# Patient Record
Sex: Female | Born: 1950
Health system: Southern US, Community
[De-identification: ages and names within clinical notes are randomized; demographics above are authoritative.]

## PROBLEM LIST (undated history)

## (undated) DIAGNOSIS — Z87891 Personal history of nicotine dependence: Secondary | ICD-10-CM

## (undated) DIAGNOSIS — E119 Type 2 diabetes mellitus without complications: Secondary | ICD-10-CM

## (undated) DIAGNOSIS — T8859XA Other complications of anesthesia, initial encounter: Secondary | ICD-10-CM

## (undated) DIAGNOSIS — N289 Disorder of kidney and ureter, unspecified: Secondary | ICD-10-CM

## (undated) DIAGNOSIS — I671 Cerebral aneurysm, nonruptured: Secondary | ICD-10-CM

## (undated) DIAGNOSIS — E785 Hyperlipidemia, unspecified: Secondary | ICD-10-CM

## (undated) DIAGNOSIS — I5022 Chronic systolic (congestive) heart failure: Secondary | ICD-10-CM

## (undated) DIAGNOSIS — I779 Disorder of arteries and arterioles, unspecified: Secondary | ICD-10-CM

## (undated) DIAGNOSIS — I10 Essential (primary) hypertension: Secondary | ICD-10-CM

## (undated) DIAGNOSIS — I639 Cerebral infarction, unspecified: Secondary | ICD-10-CM

## (undated) DIAGNOSIS — T4145XA Adverse effect of unspecified anesthetic, initial encounter: Secondary | ICD-10-CM

## (undated) DIAGNOSIS — I700 Atherosclerosis of aorta: Secondary | ICD-10-CM

## (undated) DIAGNOSIS — I428 Other cardiomyopathies: Secondary | ICD-10-CM

## (undated) DIAGNOSIS — I2510 Atherosclerotic heart disease of native coronary artery without angina pectoris: Secondary | ICD-10-CM

## (undated) HISTORY — DX: Other cardiomyopathies: I42.8

## (undated) HISTORY — DX: Atherosclerosis of aorta: I70.0

## (undated) HISTORY — DX: Disorder of kidney and ureter, unspecified: N28.9

## (undated) HISTORY — DX: Cerebral infarction, unspecified: I63.9

## (undated) HISTORY — DX: Essential (primary) hypertension: I10

## (undated) HISTORY — DX: Disorder of arteries and arterioles, unspecified: I77.9

## (undated) HISTORY — DX: Cerebral aneurysm, nonruptured: I67.1

## (undated) HISTORY — DX: Atherosclerotic heart disease of native coronary artery without angina pectoris: I25.10

## (undated) HISTORY — DX: Chronic systolic (congestive) heart failure: I50.22

---

## 2017-06-17 ENCOUNTER — Encounter (HOSPITAL_BASED_OUTPATIENT_CLINIC_OR_DEPARTMENT_OTHER): Payer: Self-pay | Admitting: *Deleted

## 2017-06-17 ENCOUNTER — Emergency Department (HOSPITAL_BASED_OUTPATIENT_CLINIC_OR_DEPARTMENT_OTHER): Payer: Medicare Other

## 2017-06-17 ENCOUNTER — Inpatient Hospital Stay (HOSPITAL_BASED_OUTPATIENT_CLINIC_OR_DEPARTMENT_OTHER)
Admission: EM | Admit: 2017-06-17 | Discharge: 2017-06-21 | DRG: 041 | Disposition: A | Discharge status: Home Health | Payer: Medicare Other | Attending: Internal Medicine | Admitting: Internal Medicine

## 2017-06-17 DIAGNOSIS — E119 Type 2 diabetes mellitus without complications: Secondary | ICD-10-CM

## 2017-06-17 DIAGNOSIS — R471 Dysarthria and anarthria: Secondary | ICD-10-CM | POA: Diagnosis present

## 2017-06-17 DIAGNOSIS — R260 Ataxic gait: Secondary | ICD-10-CM | POA: Diagnosis present

## 2017-06-17 DIAGNOSIS — I110 Hypertensive heart disease with heart failure: Secondary | ICD-10-CM | POA: Diagnosis present

## 2017-06-17 DIAGNOSIS — E785 Hyperlipidemia, unspecified: Secondary | ICD-10-CM | POA: Diagnosis present

## 2017-06-17 DIAGNOSIS — I6521 Occlusion and stenosis of right carotid artery: Secondary | ICD-10-CM | POA: Diagnosis present

## 2017-06-17 DIAGNOSIS — R410 Disorientation, unspecified: Secondary | ICD-10-CM

## 2017-06-17 DIAGNOSIS — I420 Dilated cardiomyopathy: Secondary | ICD-10-CM | POA: Diagnosis present

## 2017-06-17 DIAGNOSIS — Z79899 Other long term (current) drug therapy: Secondary | ICD-10-CM

## 2017-06-17 DIAGNOSIS — E876 Hypokalemia: Secondary | ICD-10-CM | POA: Diagnosis present

## 2017-06-17 DIAGNOSIS — I5020 Unspecified systolic (congestive) heart failure: Secondary | ICD-10-CM | POA: Diagnosis present

## 2017-06-17 DIAGNOSIS — R4182 Altered mental status, unspecified: Secondary | ICD-10-CM | POA: Diagnosis not present

## 2017-06-17 DIAGNOSIS — R29707 NIHSS score 7: Secondary | ICD-10-CM | POA: Diagnosis present

## 2017-06-17 DIAGNOSIS — R4701 Aphasia: Secondary | ICD-10-CM

## 2017-06-17 DIAGNOSIS — I63412 Cerebral infarction due to embolism of left middle cerebral artery: Secondary | ICD-10-CM | POA: Diagnosis not present

## 2017-06-17 DIAGNOSIS — Z7984 Long term (current) use of oral hypoglycemic drugs: Secondary | ICD-10-CM

## 2017-06-17 DIAGNOSIS — Z8673 Personal history of transient ischemic attack (TIA), and cerebral infarction without residual deficits: Secondary | ICD-10-CM | POA: Diagnosis present

## 2017-06-17 DIAGNOSIS — I459 Conduction disorder, unspecified: Secondary | ICD-10-CM | POA: Diagnosis present

## 2017-06-17 DIAGNOSIS — Z87891 Personal history of nicotine dependence: Secondary | ICD-10-CM

## 2017-06-17 DIAGNOSIS — I671 Cerebral aneurysm, nonruptured: Secondary | ICD-10-CM | POA: Diagnosis present

## 2017-06-17 HISTORY — DX: Personal history of nicotine dependence: Z87.891

## 2017-06-17 HISTORY — DX: Adverse effect of unspecified anesthetic, initial encounter: T41.45XA

## 2017-06-17 HISTORY — DX: Hyperlipidemia, unspecified: E78.5

## 2017-06-17 HISTORY — DX: Other complications of anesthesia, initial encounter: T88.59XA

## 2017-06-17 HISTORY — DX: Type 2 diabetes mellitus without complications: E11.9

## 2017-06-17 LAB — TROPONIN I

## 2017-06-17 LAB — BASIC METABOLIC PANEL
Anion gap: 11 (ref 5–15)
BUN: 13 mg/dL (ref 6–20)
CO2: 25 mmol/L (ref 22–32)
Calcium: 9.4 mg/dL (ref 8.9–10.3)
Chloride: 103 mmol/L (ref 101–111)
Creatinine, Ser: 0.62 mg/dL (ref 0.44–1.00)
Glucose, Bld: 136 mg/dL — ABNORMAL HIGH (ref 65–99)
POTASSIUM: 3.2 mmol/L — AB (ref 3.5–5.1)
SODIUM: 139 mmol/L (ref 135–145)

## 2017-06-17 LAB — CBC WITH DIFFERENTIAL/PLATELET
BASOS ABS: 0.1 10*3/uL (ref 0.0–0.1)
BASOS PCT: 1 %
EOS ABS: 0.1 10*3/uL (ref 0.0–0.7)
EOS PCT: 1 %
HCT: 34.4 % — ABNORMAL LOW (ref 36.0–46.0)
Hemoglobin: 11.6 g/dL — ABNORMAL LOW (ref 12.0–15.0)
LYMPHS ABS: 2.5 10*3/uL (ref 0.7–4.0)
Lymphocytes Relative: 41 %
MCH: 33 pg (ref 26.0–34.0)
MCHC: 33.7 g/dL (ref 30.0–36.0)
MCV: 97.7 fL (ref 78.0–100.0)
Monocytes Absolute: 0.5 10*3/uL (ref 0.1–1.0)
Monocytes Relative: 8 %
Neutro Abs: 3 10*3/uL (ref 1.7–7.7)
Neutrophils Relative %: 49 %
PLATELETS: 253 10*3/uL (ref 150–400)
RBC: 3.52 MIL/uL — AB (ref 3.87–5.11)
RDW: 11.8 % (ref 11.5–15.5)
WBC: 6.1 10*3/uL (ref 4.0–10.5)

## 2017-06-17 LAB — RAPID URINE DRUG SCREEN, HOSP PERFORMED
AMPHETAMINES: NOT DETECTED
BENZODIAZEPINES: NOT DETECTED
Barbiturates: NOT DETECTED
Cocaine: NOT DETECTED
OPIATES: NOT DETECTED
TETRAHYDROCANNABINOL: NOT DETECTED

## 2017-06-17 LAB — PROTIME-INR
INR: 1.06
Prothrombin Time: 13.8 seconds (ref 11.4–15.2)

## 2017-06-17 LAB — URINALYSIS, ROUTINE W REFLEX MICROSCOPIC
BILIRUBIN URINE: NEGATIVE
Glucose, UA: NEGATIVE mg/dL
Hgb urine dipstick: NEGATIVE
KETONES UR: NEGATIVE mg/dL
LEUKOCYTES UA: NEGATIVE
NITRITE: NEGATIVE
PROTEIN: NEGATIVE mg/dL
Specific Gravity, Urine: 1.023 (ref 1.005–1.030)
pH: 6.5 (ref 5.0–8.0)

## 2017-06-17 LAB — APTT: APTT: 31 s (ref 24–36)

## 2017-06-17 LAB — ETHANOL

## 2017-06-17 LAB — CBG MONITORING, ED: GLUCOSE-CAPILLARY: 131 mg/dL — AB (ref 65–99)

## 2017-06-17 MED ORDER — IOPAMIDOL (ISOVUE-370) INJECTION 76%
100.0000 mL | Freq: Once | INTRAVENOUS | Status: AC | PRN
  Administered 2017-06-17: 100 mL via INTRAVENOUS

## 2017-06-17 NOTE — ED Provider Notes (Signed)
[facility] Provider Note   CSN: [id number] Arrival date & time: 06/17/17  2125     History   Chief Complaint Chief Complaint  Patient presents with  . Altered Mental Status    HPI Elizabeth Bowman is a 66 y.o. female.  HPI  Patient, with a past medical history of diabetes, hypertension presents to ED for evaluation of altered mental status, confusion and aphasia. Husband states that last known normal was approximately 12 hours ago. He states that she was fixing breakfast and after she went back to bed she began having slurring of her words. He states that she initially did not want to come that the doctor. He states that she is unable to follow commands and unable to tell if symptoms. He denies any previous stroke, MI, DVT or PE. He reports compliance with her home medications. He denies any cough or other complaints prior to this episode. He denies any falls or head injuries.  Past Medical History:  Diagnosis Date  . Diabetes mellitus without complication (HCC)     There are no active problems to display for this patient.   History reviewed. No pertinent surgical history.  OB History    No data available       Home Medications    Prior to Admission medications   Medication Sig Start Date End Date Taking? Authorizing Provider  atorvastatin (LIPITOR) 40 MG tablet Take 40 mg by mouth daily.   Yes [provider]  lisinopril-hydrochlorothiazide (PRINZIDE,ZESTORETIC) 20-12.5 MG tablet Take 1 tablet by mouth daily.   Yes [provider]  metFORMIN (GLUCOPHAGE) 500 MG tablet Take by mouth 2 (two) times daily with a meal.   Yes [provider]  pantoprazole (PROTONIX) 40 MG tablet Take 40 mg by mouth daily.   Yes [provider]    Family History History reviewed. No pertinent family history.  Social History Social History  Substance Use Topics  . Smoking status: Never Smoker  . Smokeless tobacco: Not on file  . Alcohol use  No     Allergies   Patient has no known allergies.   Review of Systems Review of Systems  Unable to perform ROS: Mental status change  Respiratory: Negative for cough and shortness of breath.   Gastrointestinal: Negative for vomiting.  Neurological: Positive for speech difficulty and weakness. Negative for dizziness and light-headedness.     Physical Exam Updated Vital Signs BP (!) 159/74   Pulse 66   Temp 97.6 F (36.4 C)   Resp 20   Ht 5\' 7"  (1.702 m)   Wt 72.6 kg (160 lb)   SpO2 98%   BMI 25.06 kg/m   Physical Exam  Constitutional: She appears well-developed and well-nourished. No distress.  HENT:  Head: Normocephalic and atraumatic.  Nose: Nose normal.  Eyes: Conjunctivae and EOM are normal. Right eye exhibits no discharge. Left eye exhibits no discharge. No scleral icterus.  Neck: Normal range of motion. Neck supple.  Cardiovascular: Normal rate, regular rhythm, normal heart sounds and intact distal pulses.  Exam reveals no gallop and no friction rub.   No murmur heard. Pulmonary/Chest: Effort normal and breath sounds normal. No respiratory distress.  Abdominal: Soft. Bowel sounds are normal. She exhibits no distension. There is no tenderness. There is no guarding.  Musculoskeletal: Normal range of motion. She exhibits no edema.  Neurological: She is alert.  Oriented to person. Unable to follow commands. Unable to assess cranial nerves, coordination, sensation.  Skin: Skin is warm  and dry. No rash noted.  Psychiatric: She has a normal mood and affect.  Nursing note and vitals reviewed.    ED Treatments / Results  Labs (all labs ordered are listed, but only abnormal results are displayed) Labs Reviewed  CBC WITH DIFFERENTIAL/PLATELET - Abnormal; Notable for the following:       Result Value   RBC 3.52 (*)    Hemoglobin 11.6 (*)    HCT 34.4 (*)    All other components within normal limits  BASIC METABOLIC PANEL - Abnormal; Notable for the following:     Potassium 3.2 (*)    Glucose, Bld 136 (*)    All other components within normal limits  CBG MONITORING, ED - Abnormal; Notable for the following:    Glucose-Capillary 131 (*)    All other components within normal limits  URINALYSIS, ROUTINE W REFLEX MICROSCOPIC  TROPONIN I  ETHANOL  PROTIME-INR  APTT  RAPID URINE DRUG SCREEN, HOSP PERFORMED  CBG MONITORING, ED  I-STAT CHEM 8, ED    EKG  EKG Interpretation  Date/Time:  Saturday June 17 2017 21:33:45 EDT Ventricular Rate:  74 PR Interval:  180 QRS Duration: 96 QT Interval:  384 QTC Calculation: 426 R Axis:   48 Text Interpretation:  Normal sinus rhythm Minimal voltage criteria for LVH, may be normal variant Borderline ECG No STEMI.  Confirmed by Alona Bene [phone]) on 06/17/2017 10:25:34 PM       Radiology Ct Angio Head W Or Wo Contrast  Result Date: 06/17/2017 CLINICAL DATA:  Confusion and aphasia, last seen normal at 10 a.m. LEFT-sided headache. History of diabetes. EXAM: CT ANGIOGRAPHY HEAD AND NECK TECHNIQUE: Multidetector CT imaging of the head and neck was performed using the standard protocol during bolus administration of intravenous contrast. Multiplanar CT image reconstructions and MIPs were obtained to evaluate the vascular anatomy. Carotid stenosis measurements (when applicable) are obtained utilizing NASCET criteria, using the distal internal carotid diameter as the denominator. CONTRAST:  100 cc Isovue 370 COMPARISON:  None. FINDINGS: CT HEAD FINDINGS BRAIN: No intraparenchymal hemorrhage, mass effect nor midline shift. The ventricles and sulci are normal for age. Patchy supratentorial white matter hypodensities less than expected for patient's age, though non-specific are most compatible with chronic small vessel ischemic disease. Faint small hypodensity LEFT temporal lobe (axial 18/34). No abnormal extra-axial fluid collections. Basal cisterns are patent. VASCULAR: Moderate calcific atherosclerosis of the carotid  siphons. SKULL: No skull fracture. Large tooth 13 periapical abscess. No significant scalp soft tissue swelling. SINUSES/ORBITS: Mild lobulated RIGHT maxillary sinus mucosal thickening without air-fluid levels. Mastoid air cells are well aerated. The included ocular globes and orbital contents are non-suspicious. OTHER: None. CTA NECK AORTIC ARCH: Normal appearance of the thoracic arch, normal branch pattern. Mild calcific atherosclerosis. The origins of the innominate, left Common carotid artery and subclavian artery are widely patent. RIGHT CAROTID SYSTEM: Common carotid artery is widely patent, mild calcific atherosclerosis. Normal appearance of the carotid bifurcation without hemodynamically significant stenosis by NASCET criteria. Moderate atherosclerosis proximal LEFT internal carotid artery without stenosis. LEFT CAROTID SYSTEM: Common carotid artery is widely patent, mild calcific atherosclerosis. Patent carotid carotid bifurcation without hemodynamically significant stenosis by NASCET criteria, moderate eccentric calcific atherosclerosis. Normal appearance of the included internal carotid artery. VERTEBRAL ARTERIES:Codominant vertebral artery's. Mild calcific atherosclerosis proximal bilateral vertebral artery's which are patent throughout the course. Mild extrinsic compression due to degenerative cervical spine. SKELETON: No acute osseous process though bone windows have not been submitted. Severe C5-6 degenerative disc, moderate  at C3-4. Moderate to severe RIGHT C3-4 and severe LEFT greater than RIGHT C5-6 neural foraminal narrowing. Moderate canal stenosis C5-6. OTHER NECK: Soft tissues of the neck are non-acute though, not tailored for evaluation. CTA HEAD ANTERIOR CIRCULATION: Patent cervical internal carotid arteries, petrous, cavernous and supra clinoid internal carotid arteries. Moderate stenosis RIGHT cavernous internal carotid artery. Wide necked 4 mm posteriorly directed aneurysm LEFT anterior  genu (series 10, axial 245/349). Wide necked 4 mm laterally directed RIGHT paraophthalmic aneurysm. Widely patent anterior communicating artery. Patent anterior and middle cerebral arteries. Severe stenosis LEFT M2 segment, superior division. No large vessel occlusion, contrast extravasation. POSTERIOR CIRCULATION: Patent vertebral arteries, vertebrobasilar junction and basilar artery, as well as main branch vessels. Patent posterior cerebral arteries, mild luminal regularity. No large vessel occlusion, significant stenosis, contrast extravasation or aneurysm. VENOUS SINUSES: Major dural venous sinuses are patent though not tailored for evaluation on this angiographic examination. ANATOMIC VARIANTS: Aplastic RIGHT A1 segment, bilateral anterior cerebral arteries arise from LEFT A1-2 junction. DELAYED PHASE: No abnormal intracranial enhancement. MIP images reviewed. IMPRESSION: CT HEAD: 1. Small LEFT temporal lobe acute infarct versus beam hardening artifact. 2. Otherwise negative noncontrast CT HEAD for age. CTA NECK: 1. Atherosclerosis without hemodynamically significant stenosis or acute vascular process. 2. Degenerative cervical spine resulting in moderate canal stenosis C5-6. Severe C3-4 and C5-6 neural foraminal narrowing. CTA HEAD: 1. Severe stenosis LEFT M2 segment without emergent large vessel occlusion. 2. 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 3. Atherosclerosis with moderate stenosis RIGHT cavernous internal carotid artery. 4. Neuro-Interventional Radiology consultation is suggested to evaluate the appropriateness of potential treatment. Non-emergent evaluation can be arranged by calling [phone] during usual hours. Emergency evaluation can be requested by paging [phone]. Aortic Atherosclerosis (ICD10-I70.0). Electronically Signed   By: Awilda Metro M.D.   On: 06/17/2017 23:55   Dg Chest 2 View  Result Date: 06/17/2017 CLINICAL DATA:  Acute onset of confusion and aphasia.  Left-sided headache. Initial encounter. EXAM: CHEST  2 VIEW COMPARISON:  None. FINDINGS: The lungs are well-aerated and clear. There is no evidence of focal opacification, pleural effusion or pneumothorax. The heart is borderline enlarged. No acute osseous abnormalities are seen. IMPRESSION: Chronic cardiomegaly.  Lungs remain grossly clear. Electronically Signed   By: Roanna Raider M.D.   On: 06/17/2017 23:41   Ct Angio Neck W And/or Wo Contrast  Result Date: 06/17/2017 CLINICAL DATA:  Confusion and aphasia, last seen normal at 10 a.m. LEFT-sided headache. History of diabetes. EXAM: CT ANGIOGRAPHY HEAD AND NECK TECHNIQUE: Multidetector CT imaging of the head and neck was performed using the standard protocol during bolus administration of intravenous contrast. Multiplanar CT image reconstructions and MIPs were obtained to evaluate the vascular anatomy. Carotid stenosis measurements (when applicable) are obtained utilizing NASCET criteria, using the distal internal carotid diameter as the denominator. CONTRAST:  100 cc Isovue 370 COMPARISON:  None. FINDINGS: CT HEAD FINDINGS BRAIN: No intraparenchymal hemorrhage, mass effect nor midline shift. The ventricles and sulci are normal for age. Patchy supratentorial white matter hypodensities less than expected for patient's age, though non-specific are most compatible with chronic small vessel ischemic disease. Faint small hypodensity LEFT temporal lobe (axial 18/34). No abnormal extra-axial fluid collections. Basal cisterns are patent. VASCULAR: Moderate calcific atherosclerosis of the carotid siphons. SKULL: No skull fracture. Large tooth 13 periapical abscess. No significant scalp soft tissue swelling. SINUSES/ORBITS: Mild lobulated RIGHT maxillary sinus mucosal thickening without air-fluid levels. Mastoid air cells are well aerated. The included ocular globes and orbital contents  are non-suspicious. OTHER: None. CTA NECK AORTIC ARCH: Normal appearance of the  thoracic arch, normal branch pattern. Mild calcific atherosclerosis. The origins of the innominate, left Common carotid artery and subclavian artery are widely patent. RIGHT CAROTID SYSTEM: Common carotid artery is widely patent, mild calcific atherosclerosis. Normal appearance of the carotid bifurcation without hemodynamically significant stenosis by NASCET criteria. Moderate atherosclerosis proximal LEFT internal carotid artery without stenosis. LEFT CAROTID SYSTEM: Common carotid artery is widely patent, mild calcific atherosclerosis. Patent carotid carotid bifurcation without hemodynamically significant stenosis by NASCET criteria, moderate eccentric calcific atherosclerosis. Normal appearance of the included internal carotid artery. VERTEBRAL ARTERIES:Codominant vertebral artery's. Mild calcific atherosclerosis proximal bilateral vertebral artery's which are patent throughout the course. Mild extrinsic compression due to degenerative cervical spine. SKELETON: No acute osseous process though bone windows have not been submitted. Severe C5-6 degenerative disc, moderate at C3-4. Moderate to severe RIGHT C3-4 and severe LEFT greater than RIGHT C5-6 neural foraminal narrowing. Moderate canal stenosis C5-6. OTHER NECK: Soft tissues of the neck are non-acute though, not tailored for evaluation. CTA HEAD ANTERIOR CIRCULATION: Patent cervical internal carotid arteries, petrous, cavernous and supra clinoid internal carotid arteries. Moderate stenosis RIGHT cavernous internal carotid artery. Wide necked 4 mm posteriorly directed aneurysm LEFT anterior genu (series 10, axial 245/349). Wide necked 4 mm laterally directed RIGHT paraophthalmic aneurysm. Widely patent anterior communicating artery. Patent anterior and middle cerebral arteries. Severe stenosis LEFT M2 segment, superior division. No large vessel occlusion, contrast extravasation. POSTERIOR CIRCULATION: Patent vertebral arteries, vertebrobasilar junction and  basilar artery, as well as main branch vessels. Patent posterior cerebral arteries, mild luminal regularity. No large vessel occlusion, significant stenosis, contrast extravasation or aneurysm. VENOUS SINUSES: Major dural venous sinuses are patent though not tailored for evaluation on this angiographic examination. ANATOMIC VARIANTS: Aplastic RIGHT A1 segment, bilateral anterior cerebral arteries arise from LEFT A1-2 junction. DELAYED PHASE: No abnormal intracranial enhancement. MIP images reviewed. IMPRESSION: CT HEAD: 1. Small LEFT temporal lobe acute infarct versus beam hardening artifact. 2. Otherwise negative noncontrast CT HEAD for age. CTA NECK: 1. Atherosclerosis without hemodynamically significant stenosis or acute vascular process. 2. Degenerative cervical spine resulting in moderate canal stenosis C5-6. Severe C3-4 and C5-6 neural foraminal narrowing. CTA HEAD: 1. Severe stenosis LEFT M2 segment without emergent large vessel occlusion. 2. 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 3. Atherosclerosis with moderate stenosis RIGHT cavernous internal carotid artery. 4. Neuro-Interventional Radiology consultation is suggested to evaluate the appropriateness of potential treatment. Non-emergent evaluation can be arranged by calling [phone] during usual hours. Emergency evaluation can be requested by paging [phone]. Aortic Atherosclerosis (ICD10-I70.0). Electronically Signed   By: Awilda Metro M.D.   On: 06/17/2017 23:55    Procedures Procedures (including critical care time)  Medications Ordered in ED Medications  iopamidol (ISOVUE-370) 76 % injection 100 mL (100 mLs Intravenous Contrast Given 06/17/17 2257)     Initial Impression / Assessment and Plan / ED Course  I have reviewed the triage vital signs and the nursing notes.  Pertinent labs & imaging results that were available during my care of the patient were reviewed by me and considered in my medical decision making  (see chart for details).  Clinical Course as of Jun 19 23  Sat Jun 17, 2017  2156 Paged neurology.  [HK]    Clinical Course User Index [HK] Dietrich Pates, PA-C    Patient presents to ED for evaluation of sudden onset aphasia, changes in mental status. Husband states that last known normal was approximately  12 hours prior to arrival. Physical exam I am unable to complete neurological exam. Patient is alert and oriented to person but is unable to follow commands or answer questions. Husband states that prior to this episode patient was feeling fine. Denies any prior history of strokes, recent head injuries or falls.  Consulted neurology who advised [place] to inform of imaging findings.  Lab work including CBC, CBG, BMP, troponin, INR, ethanol, PTT, urinalysis and urine drug screen unremarkable. CT of head showed small left temporal lobe acute infarct versus artifact. CTA of the neck showed atherosclerosis. CT of head showed severe stenosis of left M2 segment without emergent large partial occlusion. There are bilateral 4 mm aneurysms.  Will consult neurology for further evaluation.  Neurology recommends that we transfer patient to [facility] for further evaluation and MRI, TIA workup. We'll consult the hospitalist to admit. Appreciate the help of Dr. Otelia Limes and Dr. Maryfrances Bunnell for help with this patient.  Final Clinical Impressions(s) / ED Diagnoses   Final diagnoses:  None    New Prescriptions New Prescriptions   No medications on file     Dietrich Pates, PA-C 06/18/17 4540    LongArlyss Repress, MD 06/18/17 1451

## 2017-06-17 NOTE — ED Notes (Signed)
Patient transported to CT 

## 2017-06-17 NOTE — ED Triage Notes (Signed)
Pt to the ED with confusion, asphasia.  LSN 10am per pts husband. Pt moving all extremities.  Pt unable to follow commands. Aware of person, unaware of place, time or situation.

## 2017-06-18 ENCOUNTER — Encounter (HOSPITAL_COMMUNITY): Payer: Self-pay

## 2017-06-18 DIAGNOSIS — Z79899 Other long term (current) drug therapy: Secondary | ICD-10-CM | POA: Diagnosis not present

## 2017-06-18 DIAGNOSIS — R260 Ataxic gait: Secondary | ICD-10-CM | POA: Diagnosis present

## 2017-06-18 DIAGNOSIS — I6300 Cerebral infarction due to thrombosis of unspecified precerebral artery: Secondary | ICD-10-CM | POA: Diagnosis not present

## 2017-06-18 DIAGNOSIS — E785 Hyperlipidemia, unspecified: Secondary | ICD-10-CM | POA: Diagnosis present

## 2017-06-18 DIAGNOSIS — R4701 Aphasia: Secondary | ICD-10-CM | POA: Diagnosis present

## 2017-06-18 DIAGNOSIS — Z7984 Long term (current) use of oral hypoglycemic drugs: Secondary | ICD-10-CM | POA: Diagnosis not present

## 2017-06-18 DIAGNOSIS — E119 Type 2 diabetes mellitus without complications: Secondary | ICD-10-CM

## 2017-06-18 DIAGNOSIS — E876 Hypokalemia: Secondary | ICD-10-CM | POA: Diagnosis present

## 2017-06-18 DIAGNOSIS — I638 Other cerebral infarction: Secondary | ICD-10-CM | POA: Diagnosis not present

## 2017-06-18 DIAGNOSIS — I340 Nonrheumatic mitral (valve) insufficiency: Secondary | ICD-10-CM | POA: Diagnosis not present

## 2017-06-18 DIAGNOSIS — I639 Cerebral infarction, unspecified: Secondary | ICD-10-CM

## 2017-06-18 DIAGNOSIS — I429 Cardiomyopathy, unspecified: Secondary | ICD-10-CM | POA: Diagnosis not present

## 2017-06-18 DIAGNOSIS — R4182 Altered mental status, unspecified: Secondary | ICD-10-CM | POA: Diagnosis present

## 2017-06-18 DIAGNOSIS — I110 Hypertensive heart disease with heart failure: Secondary | ICD-10-CM | POA: Diagnosis present

## 2017-06-18 DIAGNOSIS — Z8673 Personal history of transient ischemic attack (TIA), and cerebral infarction without residual deficits: Secondary | ICD-10-CM | POA: Diagnosis present

## 2017-06-18 DIAGNOSIS — I420 Dilated cardiomyopathy: Secondary | ICD-10-CM | POA: Diagnosis present

## 2017-06-18 DIAGNOSIS — I459 Conduction disorder, unspecified: Secondary | ICD-10-CM | POA: Diagnosis present

## 2017-06-18 DIAGNOSIS — I6521 Occlusion and stenosis of right carotid artery: Secondary | ICD-10-CM | POA: Diagnosis present

## 2017-06-18 DIAGNOSIS — R410 Disorientation, unspecified: Secondary | ICD-10-CM | POA: Diagnosis not present

## 2017-06-18 DIAGNOSIS — R471 Dysarthria and anarthria: Secondary | ICD-10-CM | POA: Diagnosis present

## 2017-06-18 DIAGNOSIS — I5020 Unspecified systolic (congestive) heart failure: Secondary | ICD-10-CM | POA: Diagnosis present

## 2017-06-18 DIAGNOSIS — I63412 Cerebral infarction due to embolism of left middle cerebral artery: Secondary | ICD-10-CM | POA: Diagnosis present

## 2017-06-18 DIAGNOSIS — I671 Cerebral aneurysm, nonruptured: Secondary | ICD-10-CM | POA: Diagnosis present

## 2017-06-18 DIAGNOSIS — R29707 NIHSS score 7: Secondary | ICD-10-CM | POA: Diagnosis present

## 2017-06-18 DIAGNOSIS — Z87891 Personal history of nicotine dependence: Secondary | ICD-10-CM | POA: Diagnosis not present

## 2017-06-18 LAB — CBG MONITORING, ED: GLUCOSE-CAPILLARY: 139 mg/dL — AB (ref 65–99)

## 2017-06-18 LAB — GLUCOSE, CAPILLARY
GLUCOSE-CAPILLARY: 103 mg/dL — AB (ref 65–99)
Glucose-Capillary: 206 mg/dL — ABNORMAL HIGH (ref 65–99)

## 2017-06-18 LAB — HEMOGLOBIN A1C
HEMOGLOBIN A1C: 6.1 % — AB (ref 4.8–5.6)
MEAN PLASMA GLUCOSE: 128.37 mg/dL

## 2017-06-18 MED ORDER — PROMETHAZINE HCL 25 MG/ML IJ SOLN: 12.5000 mg | Freq: Four times a day (QID) | INTRAMUSCULAR | Status: DC | PRN

## 2017-06-18 MED ORDER — ACETAMINOPHEN 325 MG PO TABS
650.0000 mg | ORAL_TABLET | ORAL | Status: DC | PRN
  Administered 2017-06-19 – 2017-06-21 (×3): 650 mg via ORAL
  Filled 2017-06-18 (×3): qty 2

## 2017-06-18 MED ORDER — INSULIN ASPART 100 UNIT/ML ~~LOC~~ SOLN
0.0000 [IU] | Freq: Every day | SUBCUTANEOUS | Status: DC
  Administered 2017-06-18: 2 [IU] via SUBCUTANEOUS

## 2017-06-18 MED ORDER — KETOROLAC TROMETHAMINE 15 MG/ML IJ SOLN
15.0000 mg | Freq: Once | INTRAMUSCULAR | Status: AC
  Administered 2017-06-18: 15 mg via INTRAVENOUS
  Filled 2017-06-18: qty 1

## 2017-06-18 MED ORDER — ACETAMINOPHEN 650 MG RE SUPP: 650.0000 mg | RECTAL | Status: DC | PRN

## 2017-06-18 MED ORDER — ATORVASTATIN CALCIUM 40 MG PO TABS: 40.0000 mg | ORAL_TABLET | Freq: Every day | ORAL | Status: DC

## 2017-06-18 MED ORDER — ENOXAPARIN SODIUM 40 MG/0.4ML ~~LOC~~ SOLN
40.0000 mg | SUBCUTANEOUS | Status: DC
  Administered 2017-06-18 – 2017-06-20 (×3): 40 mg via SUBCUTANEOUS
  Filled 2017-06-18 (×3): qty 0.4

## 2017-06-18 MED ORDER — CLOPIDOGREL BISULFATE 75 MG PO TABS
300.0000 mg | ORAL_TABLET | Freq: Once | ORAL | Status: AC
  Administered 2017-06-18: 300 mg via ORAL
  Filled 2017-06-18: qty 4

## 2017-06-18 MED ORDER — CLOPIDOGREL BISULFATE 75 MG PO TABS: 300.0000 mg | ORAL_TABLET | Freq: Every day | ORAL | Status: DC

## 2017-06-18 MED ORDER — DEXAMETHASONE SODIUM PHOSPHATE 4 MG/ML IJ SOLN
4.0000 mg | Freq: Once | INTRAMUSCULAR | Status: AC
  Administered 2017-06-18: 4 mg via INTRAVENOUS
  Filled 2017-06-18: qty 1

## 2017-06-18 MED ORDER — LISINOPRIL-HYDROCHLOROTHIAZIDE 20-12.5 MG PO TABS: 1.0000 | ORAL_TABLET | Freq: Every day | ORAL | Status: DC

## 2017-06-18 MED ORDER — ONDANSETRON HCL 4 MG/2ML IJ SOLN: 4.0000 mg | Freq: Four times a day (QID) | INTRAMUSCULAR | Status: DC | PRN

## 2017-06-18 MED ORDER — ACETAMINOPHEN 160 MG/5ML PO SOLN: 650.0000 mg | ORAL | Status: DC | PRN

## 2017-06-18 MED ORDER — CLOPIDOGREL BISULFATE 75 MG PO TABS
75.0000 mg | ORAL_TABLET | Freq: Every day | ORAL | Status: DC
  Administered 2017-06-19 – 2017-06-21 (×3): 75 mg via ORAL
  Filled 2017-06-18 (×3): qty 1

## 2017-06-18 MED ORDER — STROKE: EARLY STAGES OF RECOVERY BOOK
Freq: Once | Status: AC
  Administered 2017-06-18: 17:00:00
  Filled 2017-06-18: qty 1

## 2017-06-18 MED ORDER — PANTOPRAZOLE SODIUM 40 MG PO TBEC
40.0000 mg | DELAYED_RELEASE_TABLET | Freq: Every day | ORAL | Status: DC
  Administered 2017-06-19 – 2017-06-21 (×3): 40 mg via ORAL
  Filled 2017-06-18 (×3): qty 1

## 2017-06-18 MED ORDER — INSULIN ASPART 100 UNIT/ML ~~LOC~~ SOLN
0.0000 [IU] | Freq: Three times a day (TID) | SUBCUTANEOUS | Status: DC
Start: 2017-06-18 — End: 2017-06-21
  Administered 2017-06-19 (×2): 1 [IU] via SUBCUTANEOUS
  Administered 2017-06-20: 2 [IU] via SUBCUTANEOUS

## 2017-06-18 MED ORDER — LISINOPRIL 20 MG PO TABS
20.0000 mg | ORAL_TABLET | Freq: Every day | ORAL | Status: DC
  Administered 2017-06-19 – 2017-06-21 (×3): 20 mg via ORAL
  Filled 2017-06-18 (×3): qty 1

## 2017-06-18 MED ORDER — ASPIRIN 300 MG RE SUPP: 300.0000 mg | Freq: Every day | RECTAL | Status: DC

## 2017-06-18 MED ORDER — ATORVASTATIN CALCIUM 80 MG PO TABS
80.0000 mg | ORAL_TABLET | Freq: Every day | ORAL | Status: DC
  Administered 2017-06-19 – 2017-06-20 (×2): 80 mg via ORAL
  Filled 2017-06-18 (×2): qty 1

## 2017-06-18 MED ORDER — HYDROCHLOROTHIAZIDE 12.5 MG PO CAPS
12.5000 mg | ORAL_CAPSULE | Freq: Every day | ORAL | Status: DC
  Administered 2017-06-19 – 2017-06-20 (×2): 12.5 mg via ORAL
  Filled 2017-06-18 (×2): qty 1

## 2017-06-18 MED ORDER — ASPIRIN 325 MG PO TABS
325.0000 mg | ORAL_TABLET | Freq: Every day | ORAL | Status: DC
  Administered 2017-06-18 – 2017-06-21 (×4): 325 mg via ORAL
  Filled 2017-06-18 (×4): qty 1

## 2017-06-18 NOTE — Progress Notes (Signed)
66 yo F with DM presents with acute onset aphasia, LSN 10AM.  BP (!) 164/73   Pulse 66   Temp 97.6 F (36.4 C)   Resp 13   Ht 5\' 7"  (1.702 m)   Wt 72.6 kg (160 lb)   SpO2 99%   BMI 25.06 kg/m   -Na 139, K 3.2, Cr 0.62, WBC 6.1, Hgb 11.6 -CT head shows possible small left temporal lobe acute infarct, CTA shows severe LEFT M2 stenosis without large vessel occlusion, with neurointerventional radiology consultation in a nonemergent basis -To tele bed, inpatient status

## 2017-06-18 NOTE — ED Notes (Signed)
Pt speaking in clear sentences with no aphasia noted. Pt A/O x 4. Pt in NAD at this time.

## 2017-06-18 NOTE — ED Notes (Signed)
Questioned pt about her home medications and pt states she has them with her and took them this morning at 09:00. Pt noted to have the following medications with her:  Lisinopril/HCTZ 20-12.5mg  1 tab daily Pantoprazole 40mg  1 tab daily Atorvastating 40mg  1 tab daily Metformin 500mg  1 tab daily with breakfast.   Noted that pt stated she did take her metformin this AM. Also noted that pt had CT IV contrast last PM for test. EDP notified. No new orders at this time.

## 2017-06-18 NOTE — H&P (Signed)
History and Physical    Yukari Flax ZOX:[id number] DOB: 10-20-1951 DOA: 06/17/2017   PCP: Patient, No Pcp Per   Attending physician: Melynda Ripple  Patient coming from/Resides with: Private residence/husband  Chief Complaint: Ataxia, headache and dysarthria  HPI: Elizabeth Bowman is a 66 y.o. female with medical history significant for DM 2 on metformin, hypertension and dyslipidemia. Patient was in her usual state of health yesterday a.m. on 8/18. Around breakfast she was the last time seen normal. She was walking out of the kitchen area when she began staggering and with an ataxic gait. She steadied herself and seemed better and decided to lay back down. Upon awakening she reported a very severe headache and several minutes later developed severe dysarthria and slurring of speech. Husband denies that her mouth was "drawn" to one side or the other but stated her mouth was not "normal". She did not wish to come to the doctor so she did not present for treatment until ~12 hours later apparently because of persistent headache.  At [facility] her initial neurological exam was unremarkable and because of being outside of window for TPA code stroke was not initiated. EDP spoke with neurology who recommended CT angiogram of head and neck. This revealed a small left temporal lobe acute infarct versus beam hardening artifact as well as severe stenosis of the left M2 segment without emergent large vessel occlusion. There were also bilateral paraophthalmic wide neck to 4 mm aneurysms. Radiologist recommended neuro interventional radiology consultation for nonemergent treatment options. Patient has returned to her baseline mentation and function. Her headache persists. Her blood pressure is slightly elevated. She has been transferred to [facility]. Neurology has been notified of arrival.  ED Course:  Vital Signs: BP (!) 163/83   Pulse 64   Temp 97.6 F (36.4 C)   Resp 18   Ht 5\' 7"  (1.702 m)   Wt 72.6 kg (160 lb)   SpO2  97%   BMI 25.06 kg/m  2 view CXR: Neg except for chronic cardiomegaly CTA head/neck: As above Lab data: Sodium 139, potassium 3.2, creatinine 103, CO2 25, glucose 136, BUN 13, creatinine 0.62, anion gap 11, troponin <0.03, white count 6100 with normal differential, hemoglobin 11.6, platelets 253,000, urinalysis negative, urine drug screen negative, alcohol level <5. Medications and treatments: None  Review of Systems:  In addition to the HPI above,  No Fever-chills, myalgias or other constitutional symptoms No changes with Vision or hearing, new weakness, tingling, numbness in any extremity, dizziness, tremors or seizure activity No problems swallowing food or Liquids, indigestion/reflux, choking or coughing while eating, abdominal pain with or after eating No Chest pain, Cough or Shortness of Breath, palpitations, orthopnea or DOE No Abdominal pain, N/V, melena,hematochezia, dark tarry stools, constipation No dysuria, malodorous urine, hematuria or flank pain No new skin rashes, lesions, masses or bruises, No new joint pains, aches, swelling or redness No recent unintentional weight gain or loss No polyuria, polydypsia or polyphagia   Past Medical History:  Diagnosis Date  . Diabetes mellitus without complication (HCC)     History reviewed. No pertinent surgical history.  Social History   Social History  . Marital status: Married    Spouse name: N/A  . Number of children: N/A  . Years of education: N/A   Occupational History  . Not on file.   Social History Main Topics  . Smoking status: Former Smoker    Quit date: 06/19/2007  . Smokeless tobacco: Former Neurosurgeon  . Alcohol use No  .  Drug use: No  . Sexual activity: Not on file   Other Topics Concern  . Not on file   Social History Narrative  . No narrative on file    Mobility: Independent Work history: Not obtained   No Known Allergies  Family history reviewed and not pertinent to current admission findings  are diagnosis   Prior to Admission medications   Medication Sig Start Date End Date Taking? Authorizing Provider  atorvastatin (LIPITOR) 40 MG tablet Take 40 mg by mouth daily.   Yes [provider]  lisinopril-hydrochlorothiazide (PRINZIDE,ZESTORETIC) 20-12.5 MG tablet Take 1 tablet by mouth daily.   Yes [provider]  metFORMIN (GLUCOPHAGE) 500 MG tablet Take by mouth 2 (two) times daily with a meal.   Yes [provider]  pantoprazole (PROTONIX) 40 MG tablet Take 40 mg by mouth daily.   Yes [provider]    Physical Exam: Vitals:   06/18/17 1200 06/18/17 1230 06/18/17 1300 06/18/17 1338  BP: 134/62 (!) 145/70 (!) 144/68 (!) 163/83  Pulse:    64  Resp: 16 17 17 18   Temp:      TempSrc:      SpO2:    97%  Weight:      Height:          Constitutional: NAD, calm, uncomfortable 2/2 ongoing headache without visual disturbance Eyes: PERRL, lids and conjunctivae normal ENMT: Mucous membranes are moist. Posterior pharynx clear of any exudate or lesions. Normal dentition.  Neck: normal, supple, no masses, no thyromegaly Respiratory: clear to auscultation bilaterally, no wheezing, no crackles. Normal respiratory effort. No accessory muscle use.  Cardiovascular: Regular rate and rhythm, no murmurs / rubs / gallops. No extremity edema. 2+ pedal pulses. No carotid bruits.  Abdomen: no tenderness, no masses palpated. No hepatosplenomegaly. Bowel sounds positive.  Musculoskeletal: no clubbing / cyanosis. No joint deformity upper and lower extremities. Good ROM, no contractures. Normal muscle tone.  Skin: no rashes, lesions, ulcers. No induration Neurologic: CN 2-12 grossly intact. Sensation intact, DTR normal. Strength 5/5 x all 4 extremities.  Psychiatric: Normal judgment and insight. Alert and oriented x 3. Normal mood.    Labs on Admission: I have personally reviewed following labs and imaging studies  CBC:  Recent Labs Lab 06/17/17 2157  WBC  6.1  NEUTROABS 3.0  HGB 11.6*  HCT 34.4*  MCV 97.7  PLT 253   Basic Metabolic Panel:  Recent Labs Lab 06/17/17 2157  NA 139  K 3.2*  CL 103  CO2 25  GLUCOSE 136*  BUN 13  CREATININE 0.62  CALCIUM 9.4   GFR: Estimated Creatinine Clearance: 65.5 mL/min (by C-G formula based on SCr of 0.62 mg/dL). Liver Function Tests: No results for input(s): AST, ALT, ALKPHOS, BILITOT, PROT, ALBUMIN in the last 168 hours. No results for input(s): LIPASE, AMYLASE in the last 168 hours. No results for input(s): AMMONIA in the last 168 hours. Coagulation Profile:  Recent Labs Lab 06/17/17 2157  INR 1.06   Cardiac Enzymes:  Recent Labs Lab 06/17/17 2157  TROPONINI <0.03   BNP (last 3 results) No results for input(s): PROBNP in the last 8760 hours. HbA1C: No results for input(s): HGBA1C in the last 72 hours. CBG:  Recent Labs Lab 06/17/17 2136 06/18/17 1344  GLUCAP 131* 139*   Lipid Profile: No results for input(s): CHOL, HDL, LDLCALC, TRIG, CHOLHDL, LDLDIRECT in the last 72 hours. Thyroid Function Tests: No results for input(s): TSH, T4TOTAL, FREET4, T3FREE, THYROIDAB in the last 72  hours. Anemia Panel: No results for input(s): VITAMINB12, FOLATE, FERRITIN, TIBC, IRON, RETICCTPCT in the last 72 hours. Urine analysis:    Component Value Date/Time   COLORURINE YELLOW 06/17/2017 2325   APPEARANCEUR CLEAR 06/17/2017 2325   LABSPEC 1.023 06/17/2017 2325   PHURINE 6.5 06/17/2017 2325   GLUCOSEU NEGATIVE 06/17/2017 2325   HGBUR NEGATIVE 06/17/2017 2325   BILIRUBINUR NEGATIVE 06/17/2017 2325   KETONESUR NEGATIVE 06/17/2017 2325   PROTEINUR NEGATIVE 06/17/2017 2325   NITRITE NEGATIVE 06/17/2017 2325   LEUKOCYTESUR NEGATIVE 06/17/2017 2325   Sepsis Labs: @LABRCNTIP (procalcitonin:4,lacticidven:4) )No results found for this or any previous visit (from the past 240 hour(s)).   Radiological Exams on Admission: Ct Angio Head W Or Wo Contrast  Result Date:  06/17/2017 CLINICAL DATA:  Confusion and aphasia, last seen normal at 10 a.m. LEFT-sided headache. History of diabetes. EXAM: CT ANGIOGRAPHY HEAD AND NECK TECHNIQUE: Multidetector CT imaging of the head and neck was performed using the standard protocol during bolus administration of intravenous contrast. Multiplanar CT image reconstructions and MIPs were obtained to evaluate the vascular anatomy. Carotid stenosis measurements (when applicable) are obtained utilizing NASCET criteria, using the distal internal carotid diameter as the denominator. CONTRAST:  100 cc Isovue 370 COMPARISON:  None. FINDINGS: CT HEAD FINDINGS BRAIN: No intraparenchymal hemorrhage, mass effect nor midline shift. The ventricles and sulci are normal for age. Patchy supratentorial white matter hypodensities less than expected for patient's age, though non-specific are most compatible with chronic small vessel ischemic disease. Faint small hypodensity LEFT temporal lobe (axial 18/34). No abnormal extra-axial fluid collections. Basal cisterns are patent. VASCULAR: Moderate calcific atherosclerosis of the carotid siphons. SKULL: No skull fracture. Large tooth 13 periapical abscess. No significant scalp soft tissue swelling. SINUSES/ORBITS: Mild lobulated RIGHT maxillary sinus mucosal thickening without air-fluid levels. Mastoid air cells are well aerated. The included ocular globes and orbital contents are non-suspicious. OTHER: None. CTA NECK AORTIC ARCH: Normal appearance of the thoracic arch, normal branch pattern. Mild calcific atherosclerosis. The origins of the innominate, left Common carotid artery and subclavian artery are widely patent. RIGHT CAROTID SYSTEM: Common carotid artery is widely patent, mild calcific atherosclerosis. Normal appearance of the carotid bifurcation without hemodynamically significant stenosis by NASCET criteria. Moderate atherosclerosis proximal LEFT internal carotid artery without stenosis. LEFT CAROTID SYSTEM:  Common carotid artery is widely patent, mild calcific atherosclerosis. Patent carotid carotid bifurcation without hemodynamically significant stenosis by NASCET criteria, moderate eccentric calcific atherosclerosis. Normal appearance of the included internal carotid artery. VERTEBRAL ARTERIES:Codominant vertebral artery's. Mild calcific atherosclerosis proximal bilateral vertebral artery's which are patent throughout the course. Mild extrinsic compression due to degenerative cervical spine. SKELETON: No acute osseous process though bone windows have not been submitted. Severe C5-6 degenerative disc, moderate at C3-4. Moderate to severe RIGHT C3-4 and severe LEFT greater than RIGHT C5-6 neural foraminal narrowing. Moderate canal stenosis C5-6. OTHER NECK: Soft tissues of the neck are non-acute though, not tailored for evaluation. CTA HEAD ANTERIOR CIRCULATION: Patent cervical internal carotid arteries, petrous, cavernous and supra clinoid internal carotid arteries. Moderate stenosis RIGHT cavernous internal carotid artery. Wide necked 4 mm posteriorly directed aneurysm LEFT anterior genu (series 10, axial 245/349). Wide necked 4 mm laterally directed RIGHT paraophthalmic aneurysm. Widely patent anterior communicating artery. Patent anterior and middle cerebral arteries. Severe stenosis LEFT M2 segment, superior division. No large vessel occlusion, contrast extravasation. POSTERIOR CIRCULATION: Patent vertebral arteries, vertebrobasilar junction and basilar artery, as well as main branch vessels. Patent posterior cerebral arteries, mild luminal regularity. No large vessel  occlusion, significant stenosis, contrast extravasation or aneurysm. VENOUS SINUSES: Major dural venous sinuses are patent though not tailored for evaluation on this angiographic examination. ANATOMIC VARIANTS: Aplastic RIGHT A1 segment, bilateral anterior cerebral arteries arise from LEFT A1-2 junction. DELAYED PHASE: No abnormal intracranial  enhancement. MIP images reviewed. IMPRESSION: CT HEAD: 1. Small LEFT temporal lobe acute infarct versus beam hardening artifact. 2. Otherwise negative noncontrast CT HEAD for age. CTA NECK: 1. Atherosclerosis without hemodynamically significant stenosis or acute vascular process. 2. Degenerative cervical spine resulting in moderate canal stenosis C5-6. Severe C3-4 and C5-6 neural foraminal narrowing. CTA HEAD: 1. Severe stenosis LEFT M2 segment without emergent large vessel occlusion. 2. 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 3. Atherosclerosis with moderate stenosis RIGHT cavernous internal carotid artery. 4. Neuro-Interventional Radiology consultation is suggested to evaluate the appropriateness of potential treatment. Non-emergent evaluation can be arranged by calling [phone] during usual hours. Emergency evaluation can be requested by paging [phone]. Aortic Atherosclerosis (ICD10-I70.0). Electronically Signed   By: Awilda Metro M.D.   On: 06/17/2017 23:55   Dg Chest 2 View  Result Date: 06/17/2017 CLINICAL DATA:  Acute onset of confusion and aphasia. Left-sided headache. Initial encounter. EXAM: CHEST  2 VIEW COMPARISON:  None. FINDINGS: The lungs are well-aerated and clear. There is no evidence of focal opacification, pleural effusion or pneumothorax. The heart is borderline enlarged. No acute osseous abnormalities are seen. IMPRESSION: Chronic cardiomegaly.  Lungs remain grossly clear. Electronically Signed   By: Roanna Raider M.D.   On: 06/17/2017 23:41   Ct Angio Neck W And/or Wo Contrast  Result Date: 06/17/2017 CLINICAL DATA:  Confusion and aphasia, last seen normal at 10 a.m. LEFT-sided headache. History of diabetes. EXAM: CT ANGIOGRAPHY HEAD AND NECK TECHNIQUE: Multidetector CT imaging of the head and neck was performed using the standard protocol during bolus administration of intravenous contrast. Multiplanar CT image reconstructions and MIPs were obtained to  evaluate the vascular anatomy. Carotid stenosis measurements (when applicable) are obtained utilizing NASCET criteria, using the distal internal carotid diameter as the denominator. CONTRAST:  100 cc Isovue 370 COMPARISON:  None. FINDINGS: CT HEAD FINDINGS BRAIN: No intraparenchymal hemorrhage, mass effect nor midline shift. The ventricles and sulci are normal for age. Patchy supratentorial white matter hypodensities less than expected for patient's age, though non-specific are most compatible with chronic small vessel ischemic disease. Faint small hypodensity LEFT temporal lobe (axial 18/34). No abnormal extra-axial fluid collections. Basal cisterns are patent. VASCULAR: Moderate calcific atherosclerosis of the carotid siphons. SKULL: No skull fracture. Large tooth 13 periapical abscess. No significant scalp soft tissue swelling. SINUSES/ORBITS: Mild lobulated RIGHT maxillary sinus mucosal thickening without air-fluid levels. Mastoid air cells are well aerated. The included ocular globes and orbital contents are non-suspicious. OTHER: None. CTA NECK AORTIC ARCH: Normal appearance of the thoracic arch, normal branch pattern. Mild calcific atherosclerosis. The origins of the innominate, left Common carotid artery and subclavian artery are widely patent. RIGHT CAROTID SYSTEM: Common carotid artery is widely patent, mild calcific atherosclerosis. Normal appearance of the carotid bifurcation without hemodynamically significant stenosis by NASCET criteria. Moderate atherosclerosis proximal LEFT internal carotid artery without stenosis. LEFT CAROTID SYSTEM: Common carotid artery is widely patent, mild calcific atherosclerosis. Patent carotid carotid bifurcation without hemodynamically significant stenosis by NASCET criteria, moderate eccentric calcific atherosclerosis. Normal appearance of the included internal carotid artery. VERTEBRAL ARTERIES:Codominant vertebral artery's. Mild calcific atherosclerosis proximal  bilateral vertebral artery's which are patent throughout the course. Mild extrinsic compression due to degenerative cervical spine. SKELETON: No  acute osseous process though bone windows have not been submitted. Severe C5-6 degenerative disc, moderate at C3-4. Moderate to severe RIGHT C3-4 and severe LEFT greater than RIGHT C5-6 neural foraminal narrowing. Moderate canal stenosis C5-6. OTHER NECK: Soft tissues of the neck are non-acute though, not tailored for evaluation. CTA HEAD ANTERIOR CIRCULATION: Patent cervical internal carotid arteries, petrous, cavernous and supra clinoid internal carotid arteries. Moderate stenosis RIGHT cavernous internal carotid artery. Wide necked 4 mm posteriorly directed aneurysm LEFT anterior genu (series 10, axial 245/349). Wide necked 4 mm laterally directed RIGHT paraophthalmic aneurysm. Widely patent anterior communicating artery. Patent anterior and middle cerebral arteries. Severe stenosis LEFT M2 segment, superior division. No large vessel occlusion, contrast extravasation. POSTERIOR CIRCULATION: Patent vertebral arteries, vertebrobasilar junction and basilar artery, as well as main branch vessels. Patent posterior cerebral arteries, mild luminal regularity. No large vessel occlusion, significant stenosis, contrast extravasation or aneurysm. VENOUS SINUSES: Major dural venous sinuses are patent though not tailored for evaluation on this angiographic examination. ANATOMIC VARIANTS: Aplastic RIGHT A1 segment, bilateral anterior cerebral arteries arise from LEFT A1-2 junction. DELAYED PHASE: No abnormal intracranial enhancement. MIP images reviewed. IMPRESSION: CT HEAD: 1. Small LEFT temporal lobe acute infarct versus beam hardening artifact. 2. Otherwise negative noncontrast CT HEAD for age. CTA NECK: 1. Atherosclerosis without hemodynamically significant stenosis or acute vascular process. 2. Degenerative cervical spine resulting in moderate canal stenosis C5-6. Severe C3-4 and  C5-6 neural foraminal narrowing. CTA HEAD: 1. Severe stenosis LEFT M2 segment without emergent large vessel occlusion. 2. 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 3. Atherosclerosis with moderate stenosis RIGHT cavernous internal carotid artery. 4. Neuro-Interventional Radiology consultation is suggested to evaluate the appropriateness of potential treatment. Non-emergent evaluation can be arranged by calling [phone] during usual hours. Emergency evaluation can be requested by paging [phone]. Aortic Atherosclerosis (ICD10-I70.0). Electronically Signed   By: Awilda Metro M.D.   On: 06/17/2017 23:55    EKG: (Independently reviewed) sinus rhythm with ventricular rate 74 bpm, QTC 426 ms, normal R-wave rotation, elevated J point, no acute ischemic changes, voltage criteria consistent with LVH  Assessment/Plan Principal Problem:   Stroke  -Patient presented with ataxia, headache and dysarthria outside of one of her TPA with CT evidence of possible stroke and associated left M2 stenosis -Neuro consultation -May require neuro-interventional radiology assistance with M2 stenosis-defer to neurologist -Stat MR brain to better clarify -Since possible ischemic stroke in temporal lobe area have instituted seizure precautions noting patient has not had any seizure activity since onset of symptoms -Persistent headache so will give one-time dose of Toradol and IV Decadron -As needed Zofran/Phenergan IV for nausea -HgbA1c/lipid panel -Frequent neurological checks -Echocardiogram -PT/OT/SLP evaluation -Antiplatelet with aspirin  Active Problems:   Diabetes mellitus type 2 in nonobese  -Follow up on A1c -Hold metformin -SSI    HLD (hyperlipidemia) -Continue preadmission statin      DVT prophylaxis: Lovenox  Code Status: Full  Family Communication: Husband  Disposition Plan: Home Consults called: Neurology hospitalist/[place]    Russella Dar ANP-BC [facility] Pager [phone]   If 7PM-7AM, please contact night-coverage [email or web address] Password [facility]  06/18/2017, 3:46 PM

## 2017-06-18 NOTE — Consult Note (Signed)
Requesting Physician: Dr. Maryfrances Bunnell    Chief Complaint: AMS, aphasia - stroke transfer  History obtained from:  Patient and Chart   HPI:                                                                                                                                      Elizabeth Bowman is an 66 y.o. female with a past medical history significant for diabetes and hypertension presented to [facility] ED with complaints of AMS, confusion and aphasia. Her husband stated that she woke around 0930 this morning without any deficit. They ate breakfast in bed, and when she got out of bed to bring the plates to the kitchen, her husband said that she stumbled and fell against the wall. She continued to the kitchen and when she returned, she was slurring her words and complaining about a terrible headache. Mr. Cammon suggested he bring her to the hospital, but she initially refused medical care. She got back into bed and only spoke a few, nearly incomprehensible words to him before going back to sleep.  On my visit, Elizabeth Bowman was alert, oriented and without speech deficit. She complained of a headache, and nausea, but denied dizziness, blurry vision, photophobia and phonophobia, pain.   Pertinent Imaging: CTA head/neck 06/17/17: Severe stenosis LEFT M2 segment without emergent LVO. Atherosclerosis with moderate stenosis RIGHT cavernous ICA. MRI brain pending  Pertinent Medications: Aspirin 325 daily Lipitor 40mg  daily Lovenox 40mg  daily  Date last known well: Date: 06/17/2017 Time last known well: Time: 10:00  tPA Given: No: out of window  Modified Rankin Score: 0  Stroke Risk Factors - diabetes mellitus and hypertension   Past Medical History:  Diagnosis Date  . Diabetes mellitus without complication (HCC)     History reviewed. No pertinent surgical history.  History reviewed. No pertinent family history.   reports that she quit smoking about 10 years ago. She has quit using smokeless tobacco.  She reports that she does not drink alcohol or use drugs.  No Known Allergies  Medications:                                                                                                                       Current Meds  Medication Sig  . atorvastatin (LIPITOR) 40 MG tablet Take 40 mg by mouth daily.  Marland Kitchen lisinopril-hydrochlorothiazide (PRINZIDE,ZESTORETIC) 20-12.5 MG tablet Take 1 tablet by mouth  daily.  . metFORMIN (GLUCOPHAGE) 500 MG tablet Take by mouth 2 (two) times daily with a meal.  . pantoprazole (PROTONIX) 40 MG tablet Take 40 mg by mouth daily.    Review Of Systems:                                                                                                           History obtained from the patient  General: Negative for fever Psychological: Negative for any known behavioral disorder Ophthalmic: Negative for blurry or double vision ENT: Negative for vertigo Respiratory: Negative for cough, shortness of breath  Cardiovascular: Negative for chest pain Gastrointestinal: Negative for abdominal pain Musculoskeletal: Negative for joint swelling or muscular weakness Neurological: As noted in HPI Dermatological: Negative for rash or skin changes, numbness or tingling  Blood pressure (!) 163/83, pulse 64, temperature 97.6 F (36.4 C), resp. rate 18, height 5\' 7"  (1.702 m), weight 72.6 kg (160 lb), SpO2 97 %.   Physical Examination:                                                                                                      General: WDWN female.  HEENT:  Normocephalic, no lesions, without obvious abnomality.  Normal external eye and conjunctiva.  Normal external ears. Normal external nose, mucus membranes and septum.  Normal pharynx. Cardiovascular: regular rate and rhythm, pulses palpable throughout   Pulmonary: Unlabored Abdomen: Soft Extremities: no joint deformities, effusion, or inflammation Skin: warm and dry, no hyperpigmentation, vitiligo, or suspicious  lesions  Neurological Examination:                                                                                               Mental Status: Elizabeth Bowman is alert, oriented, thought content appropriate.  Speech fluent without evidence of aphasia. Able to follow 3-step commands without difficulty. Cranial Nerves: II: Visual fields grossly normal, pupils are equal, round, reactive to light. III,IV, VI: Ptosis not present, extra-ocular muscle movements intact bilaterally V,VII: Smile and eyebrow raise is symmetric. Facial light touch and pinprick sensation intact bilaterally VIII: Hearing grossly intact IX,X: Uvula and palate rise symmetrically XI: SCM and bilateral shoulder shrug strength 5/5 XII: Midline tongue extension Motor: Right :     Upper extremity  5/5   Left:     Upper extremity   5/5          Lower extremity   4/5    Lower extremity   5/5 Pronator drift present on the right Sensory: Pinprick and light touch intact throughout, bilaterally Deep Tendon Reflexes: 2+ and symmetric throughout Plantars: Right: downgoing   Left: downgoing Cerebellar: Finger-to-nose test without evidence of dysmetria or ataxia. Heel-to-shin test executed within normal limits. Gait: Deferred  Lab Results: Basic Metabolic Panel:  Recent Labs Lab 06/17/17 2157  NA 139  K 3.2*  CL 103  CO2 25  GLUCOSE 136*  BUN 13  CREATININE 0.62  CALCIUM 9.4    Liver Function Tests: No results for input(s): AST, ALT, ALKPHOS, BILITOT, PROT, ALBUMIN in the last 168 hours. No results for input(s): LIPASE, AMYLASE in the last 168 hours. No results for input(s): AMMONIA in the last 168 hours.  CBC:  Recent Labs Lab 06/17/17 2157  WBC 6.1  NEUTROABS 3.0  HGB 11.6*  HCT 34.4*  MCV 97.7  PLT 253    Cardiac Enzymes:  Recent Labs Lab 06/17/17 2157  TROPONINI <0.03    Lipid Panel: No results for input(s): CHOL, TRIG, HDL, CHOLHDL, VLDL, LDLCALC in the last 168 hours.  CBG:  Recent Labs Lab  06/17/17 2136 06/18/17 1344  GLUCAP 131* 139*    Microbiology: No results found for this or any previous visit.  Coagulation Studies:  Recent Labs  06/17/17 2157  LABPROT 13.8  INR 1.06    Imaging: Ct Angio Head W Or Wo Contrast  Result Date: 06/17/2017 CLINICAL DATA:  Confusion and aphasia, last seen normal at 10 a.m. LEFT-sided headache. History of diabetes. EXAM: CT ANGIOGRAPHY HEAD AND NECK TECHNIQUE: Multidetector CT imaging of the head and neck was performed using the standard protocol during bolus administration of intravenous contrast. Multiplanar CT image reconstructions and MIPs were obtained to evaluate the vascular anatomy. Carotid stenosis measurements (when applicable) are obtained utilizing NASCET criteria, using the distal internal carotid diameter as the denominator. CONTRAST:  100 cc Isovue 370 COMPARISON:  None. FINDINGS: CT HEAD FINDINGS BRAIN: No intraparenchymal hemorrhage, mass effect nor midline shift. The ventricles and sulci are normal for age. Patchy supratentorial white matter hypodensities less than expected for patient's age, though non-specific are most compatible with chronic small vessel ischemic disease. Faint small hypodensity LEFT temporal lobe (axial 18/34). No abnormal extra-axial fluid collections. Basal cisterns are patent. VASCULAR: Moderate calcific atherosclerosis of the carotid siphons. SKULL: No skull fracture. Large tooth 13 periapical abscess. No significant scalp soft tissue swelling. SINUSES/ORBITS: Mild lobulated RIGHT maxillary sinus mucosal thickening without air-fluid levels. Mastoid air cells are well aerated. The included ocular globes and orbital contents are non-suspicious. OTHER: None. CTA NECK AORTIC ARCH: Normal appearance of the thoracic arch, normal branch pattern. Mild calcific atherosclerosis. The origins of the innominate, left Common carotid artery and subclavian artery are widely patent. RIGHT CAROTID SYSTEM: Common carotid  artery is widely patent, mild calcific atherosclerosis. Normal appearance of the carotid bifurcation without hemodynamically significant stenosis by NASCET criteria. Moderate atherosclerosis proximal LEFT internal carotid artery without stenosis. LEFT CAROTID SYSTEM: Common carotid artery is widely patent, mild calcific atherosclerosis. Patent carotid carotid bifurcation without hemodynamically significant stenosis by NASCET criteria, moderate eccentric calcific atherosclerosis. Normal appearance of the included internal carotid artery. VERTEBRAL ARTERIES:Codominant vertebral artery's. Mild calcific atherosclerosis proximal bilateral vertebral artery's which are patent throughout the course. Mild extrinsic compression due to degenerative cervical spine. SKELETON: No  acute osseous process though bone windows have not been submitted. Severe C5-6 degenerative disc, moderate at C3-4. Moderate to severe RIGHT C3-4 and severe LEFT greater than RIGHT C5-6 neural foraminal narrowing. Moderate canal stenosis C5-6. OTHER NECK: Soft tissues of the neck are non-acute though, not tailored for evaluation. CTA HEAD ANTERIOR CIRCULATION: Patent cervical internal carotid arteries, petrous, cavernous and supra clinoid internal carotid arteries. Moderate stenosis RIGHT cavernous internal carotid artery. Wide necked 4 mm posteriorly directed aneurysm LEFT anterior genu (series 10, axial 245/349). Wide necked 4 mm laterally directed RIGHT paraophthalmic aneurysm. Widely patent anterior communicating artery. Patent anterior and middle cerebral arteries. Severe stenosis LEFT M2 segment, superior division. No large vessel occlusion, contrast extravasation. POSTERIOR CIRCULATION: Patent vertebral arteries, vertebrobasilar junction and basilar artery, as well as main branch vessels. Patent posterior cerebral arteries, mild luminal regularity. No large vessel occlusion, significant stenosis, contrast extravasation or aneurysm. VENOUS SINUSES:  Major dural venous sinuses are patent though not tailored for evaluation on this angiographic examination. ANATOMIC VARIANTS: Aplastic RIGHT A1 segment, bilateral anterior cerebral arteries arise from LEFT A1-2 junction. DELAYED PHASE: No abnormal intracranial enhancement. MIP images reviewed. IMPRESSION: CT HEAD: 1. Small LEFT temporal lobe acute infarct versus beam hardening artifact. 2. Otherwise negative noncontrast CT HEAD for age. CTA NECK: 1. Atherosclerosis without hemodynamically significant stenosis or acute vascular process. 2. Degenerative cervical spine resulting in moderate canal stenosis C5-6. Severe C3-4 and C5-6 neural foraminal narrowing. CTA HEAD: 1. Severe stenosis LEFT M2 segment without emergent large vessel occlusion. 2. 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 3. Atherosclerosis with moderate stenosis RIGHT cavernous internal carotid artery. 4. Neuro-Interventional Radiology consultation is suggested to evaluate the appropriateness of potential treatment. Non-emergent evaluation can be arranged by calling [phone] during usual hours. Emergency evaluation can be requested by paging [phone]. Aortic Atherosclerosis (ICD10-I70.0). Electronically Signed   By: Awilda Metro M.D.   On: 06/17/2017 23:55   Dg Chest 2 View  Result Date: 06/17/2017 CLINICAL DATA:  Acute onset of confusion and aphasia. Left-sided headache. Initial encounter. EXAM: CHEST  2 VIEW COMPARISON:  None. FINDINGS: The lungs are well-aerated and clear. There is no evidence of focal opacification, pleural effusion or pneumothorax. The heart is borderline enlarged. No acute osseous abnormalities are seen. IMPRESSION: Chronic cardiomegaly.  Lungs remain grossly clear. Electronically Signed   By: Roanna Raider M.D.   On: 06/17/2017 23:41   Ct Angio Neck W And/or Wo Contrast  Result Date: 06/17/2017 CLINICAL DATA:  Confusion and aphasia, last seen normal at 10 a.m. LEFT-sided headache. History of  diabetes. EXAM: CT ANGIOGRAPHY HEAD AND NECK TECHNIQUE: Multidetector CT imaging of the head and neck was performed using the standard protocol during bolus administration of intravenous contrast. Multiplanar CT image reconstructions and MIPs were obtained to evaluate the vascular anatomy. Carotid stenosis measurements (when applicable) are obtained utilizing NASCET criteria, using the distal internal carotid diameter as the denominator. CONTRAST:  100 cc Isovue 370 COMPARISON:  None. FINDINGS: CT HEAD FINDINGS BRAIN: No intraparenchymal hemorrhage, mass effect nor midline shift. The ventricles and sulci are normal for age. Patchy supratentorial white matter hypodensities less than expected for patient's age, though non-specific are most compatible with chronic small vessel ischemic disease. Faint small hypodensity LEFT temporal lobe (axial 18/34). No abnormal extra-axial fluid collections. Basal cisterns are patent. VASCULAR: Moderate calcific atherosclerosis of the carotid siphons. SKULL: No skull fracture. Large tooth 13 periapical abscess. No significant scalp soft tissue swelling. SINUSES/ORBITS: Mild lobulated RIGHT maxillary sinus mucosal thickening without  air-fluid levels. Mastoid air cells are well aerated. The included ocular globes and orbital contents are non-suspicious. OTHER: None. CTA NECK AORTIC ARCH: Normal appearance of the thoracic arch, normal branch pattern. Mild calcific atherosclerosis. The origins of the innominate, left Common carotid artery and subclavian artery are widely patent. RIGHT CAROTID SYSTEM: Common carotid artery is widely patent, mild calcific atherosclerosis. Normal appearance of the carotid bifurcation without hemodynamically significant stenosis by NASCET criteria. Moderate atherosclerosis proximal LEFT internal carotid artery without stenosis. LEFT CAROTID SYSTEM: Common carotid artery is widely patent, mild calcific atherosclerosis. Patent carotid carotid bifurcation  without hemodynamically significant stenosis by NASCET criteria, moderate eccentric calcific atherosclerosis. Normal appearance of the included internal carotid artery. VERTEBRAL ARTERIES:Codominant vertebral artery's. Mild calcific atherosclerosis proximal bilateral vertebral artery's which are patent throughout the course. Mild extrinsic compression due to degenerative cervical spine. SKELETON: No acute osseous process though bone windows have not been submitted. Severe C5-6 degenerative disc, moderate at C3-4. Moderate to severe RIGHT C3-4 and severe LEFT greater than RIGHT C5-6 neural foraminal narrowing. Moderate canal stenosis C5-6. OTHER NECK: Soft tissues of the neck are non-acute though, not tailored for evaluation. CTA HEAD ANTERIOR CIRCULATION: Patent cervical internal carotid arteries, petrous, cavernous and supra clinoid internal carotid arteries. Moderate stenosis RIGHT cavernous internal carotid artery. Wide necked 4 mm posteriorly directed aneurysm LEFT anterior genu (series 10, axial 245/349). Wide necked 4 mm laterally directed RIGHT paraophthalmic aneurysm. Widely patent anterior communicating artery. Patent anterior and middle cerebral arteries. Severe stenosis LEFT M2 segment, superior division. No large vessel occlusion, contrast extravasation. POSTERIOR CIRCULATION: Patent vertebral arteries, vertebrobasilar junction and basilar artery, as well as main branch vessels. Patent posterior cerebral arteries, mild luminal regularity. No large vessel occlusion, significant stenosis, contrast extravasation or aneurysm. VENOUS SINUSES: Major dural venous sinuses are patent though not tailored for evaluation on this angiographic examination. ANATOMIC VARIANTS: Aplastic RIGHT A1 segment, bilateral anterior cerebral arteries arise from LEFT A1-2 junction. DELAYED PHASE: No abnormal intracranial enhancement. MIP images reviewed. IMPRESSION: CT HEAD: 1. Small LEFT temporal lobe acute infarct versus beam  hardening artifact. 2. Otherwise negative noncontrast CT HEAD for age. CTA NECK: 1. Atherosclerosis without hemodynamically significant stenosis or acute vascular process. 2. Degenerative cervical spine resulting in moderate canal stenosis C5-6. Severe C3-4 and C5-6 neural foraminal narrowing. CTA HEAD: 1. Severe stenosis LEFT M2 segment without emergent large vessel occlusion. 2. 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 3. Atherosclerosis with moderate stenosis RIGHT cavernous internal carotid artery. 4. Neuro-Interventional Radiology consultation is suggested to evaluate the appropriateness of potential treatment. Non-emergent evaluation can be arranged by calling [phone] during usual hours. Emergency evaluation can be requested by paging [phone]. Aortic Atherosclerosis (ICD10-I70.0). Electronically Signed   By: Awilda Metro M.D.   On: 06/17/2017 23:55     Assessment Elizabeth Bowman is a 66 year old female with multiple stroke risk factors who presented to [facility] for aphasia and altered mental status. On my visit, she had fluent speech but right sided weakness. CTA at that facility revealed a severely stenotic left M2. It is likely that pathology at this location caused her symptoms. Proceed with stroke workup as below. She will be attended to by the stroke team starting tomorrow.  Recommend 1) Stroke workup  HgbA1c, fasting lipid panel  MRI of the brain without contrast  PT consult, OT consult, Speech consult  Echocardiogram  Prophylactic therapy-Aspirin/Plavix  Risk factor modification  Telemetry monitoring  Frequent neuro checks  NPO until passes stroke swallow screen 2) Correction  of metabolic derangements per primary team.  Elizabeth Ramsay PA-C [facility] [phone]  06/18/2017, 3:27 PM  NEUROHOSPITALIST ADDENDUM Seen and examined the patient this AM. Formulated plan as documented above. Recommendations as above. Will follow.  73 y AAF RH  HTN, DM  with sudden onset expressive aphasia, CTA shows significant M2 stenosis vs subocclusive thrombus. Pt back to baseline and symptoms not fluctuating. Will start her on ASA, Plavix and Atorvastatin 80 mg and manage her based on medical arm of  [facility] protocol.    Georgiana Spinner Lehi Phifer MD [facility] [phone]  If 7pm to 7am, please call on call as listed on AMION.

## 2017-06-18 NOTE — Progress Notes (Signed)
Elizabeth Bowman is a 66 y.o. female patient admitted from ED awake, alert - oriented  X 4 - states I have a really bad headache.  VSS - Blood pressure (!) 180/85, pulse 65, temperature 98.5 F (36.9 C), temperature source Oral, resp. rate 18, height 5\' 7"  (1.702 m), weight 76 kg (167 lb 9.6 oz), SpO2 100 %.    IV in place, occlusive dsg intact without redness.  Orientation to room, and floor completed with information packet given to patient/family.  Patient declined safety video at this time.  Admission INP armband ID verified with patient/family, and in place.   SR up x 2, fall assessment complete, with patient and family able to verbalize understanding of risk associated with falls, and verbalized understanding to call nsg before up out of bed.  Call light within reach, patient able to voice, and demonstrate understanding.  Skin, clean-dry- intact without evidence of bruising, or skin tears.   No evidence of skin break down noted on exam.     Will cont to eval and treat per MD orders.  Evern Bio, RN 06/18/2017 3:56 PM

## 2017-06-19 ENCOUNTER — Encounter (HOSPITAL_COMMUNITY): Payer: Self-pay | Admitting: *Deleted

## 2017-06-19 ENCOUNTER — Inpatient Hospital Stay (HOSPITAL_COMMUNITY): Payer: Medicare Other

## 2017-06-19 DIAGNOSIS — I6300 Cerebral infarction due to thrombosis of unspecified precerebral artery: Secondary | ICD-10-CM

## 2017-06-19 DIAGNOSIS — I638 Other cerebral infarction: Secondary | ICD-10-CM

## 2017-06-19 DIAGNOSIS — I340 Nonrheumatic mitral (valve) insufficiency: Secondary | ICD-10-CM

## 2017-06-19 DIAGNOSIS — R4701 Aphasia: Secondary | ICD-10-CM

## 2017-06-19 LAB — LIPID PANEL
CHOL/HDL RATIO: 3.8 ratio
CHOLESTEROL: 180 mg/dL (ref 0–200)
HDL: 48 mg/dL (ref >40)
LDL Cholesterol: 116 mg/dL — ABNORMAL HIGH (ref 0–99)
Triglycerides: 82 mg/dL (ref <150)
VLDL: 16 mg/dL (ref 0–40)

## 2017-06-19 LAB — GLUCOSE, CAPILLARY
GLUCOSE-CAPILLARY: 128 mg/dL — AB (ref 65–99)
GLUCOSE-CAPILLARY: 91 mg/dL (ref 65–99)
GLUCOSE-CAPILLARY: 140 mg/dL — AB (ref 65–99)
Glucose-Capillary: 132 mg/dL — ABNORMAL HIGH (ref 65–99)

## 2017-06-19 LAB — ECHOCARDIOGRAM COMPLETE
Height: 67 in
WEIGHTICAEL: 2681.6 [oz_av]

## 2017-06-19 NOTE — Progress Notes (Signed)
STROKE TEAM PROGRESS NOTE   HISTORY OF PRESENT ILLNESS (per record) Elizabeth Bowman is an 66 y.o. female with a past medical history significant for diabetes and hypertension who presented to [facility] ED with complaints of AMS, confusion and aphasia. Her husband stated that she woke around 0930 this morning without any deficit. They ate breakfast in bed, and when she got out of bed to bring the plates to the kitchen, her husband said that she stumbled and fell against the wall. She continued to the kitchen and when she returned, she was slurring her words and complaining about a terrible headache. Mr. Avena suggested he bring her to the hospital, but she initially refused medical care. She got back into bed and only spoke a few, nearly incomprehensible words to him before going back to sleep.   On initial exam by the neurohospitalist, Elizabeth Bowman was alert, oriented and without speech deficit. She complained of a headache, and nausea, butenied dizziness, blurry vision, photophobia and phonophobia, pain.  Pertinent Imaging: CTA head/neck 06/17/17: Severe stenosis LEFT M2 segment without emergent LVO. Atherosclerosis with moderate stenosis RIGHT cavernous ICA.   Patient was not administered IV t-PA secondary to arriving outside of the treatment window. She was admitted to General Neurology for further evaluation and treatment.   SUBJECTIVE (INTERVAL HISTORY) Her family is at the bedside.  The patient is awake, alert, and follows all commands appropriately.   OBJECTIVE Temp:  [97.7 F (36.5 C)-98.6 F (37 C)] 98.2 F (36.8 C) (08/20 1436) Pulse Rate:  [53-67] 63 (08/20 1436) Cardiac Rhythm: Sinus bradycardia (08/20 0700) Resp:  [18] 18 (08/20 1436) BP: (115-168)/(58-85) 146/70 (08/20 1436) SpO2:  [98 %-100 %] 100 % (08/20 1436)  CBC:   Recent Labs Lab 06/17/17 2157  WBC 6.1  NEUTROABS 3.0  HGB 11.6*  HCT 34.4*  MCV 97.7  PLT 253    Basic Metabolic Panel:   Recent Labs Lab  06/17/17 2157  NA 139  K 3.2*  CL 103  CO2 25  GLUCOSE 136*  BUN 13  CREATININE 0.62  CALCIUM 9.4    Lipid Panel:     Component Value Date/Time   CHOL 180 06/19/2017 0310   TRIG 82 06/19/2017 0310   HDL 48 06/19/2017 0310   CHOLHDL 3.8 06/19/2017 0310   VLDL 16 06/19/2017 0310   LDLCALC 116 (H) 06/19/2017 0310   HgbA1c:  Lab Results  Component Value Date   HGBA1C 6.1 (H) 06/18/2017   Urine Drug Screen:     Component Value Date/Time   LABOPIA NONE DETECTED 06/17/2017 2325   COCAINSCRNUR NONE DETECTED 06/17/2017 2325   LABBENZ NONE DETECTED 06/17/2017 2325   AMPHETMU NONE DETECTED 06/17/2017 2325   THCU NONE DETECTED 06/17/2017 2325   LABBARB NONE DETECTED 06/17/2017 2325    Alcohol Level     Component Value Date/Time   ETH <5 06/17/2017 2157    IMAGING  CT HEAD 06/17/2017 1. Small LEFT temporal lobe acute infarct versus beam hardening artifact. 2. Otherwise negative noncontrast CT HEAD for age.  Ct Angio Head W Or Wo Contrast 06/17/2017 CTA HEAD: 1. Severe stenosis LEFT M2 segment without emergent large vessel occlusion. 2. 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 3. Atherosclerosis with moderate stenosis RIGHT cavernous internal carotid artery.   Dg Chest 2 View 06/17/2017 IMPRESSION: Chronic cardiomegaly.  Lungs remain grossly clear.   Ct Angio Neck W And/or Wo Contrast 06/17/2017 CTA NECK: 1. Atherosclerosis without hemodynamically significant stenosis or acute vascular process. 2.  Degenerative cervical spine resulting in moderate canal stenosis C5-6. Severe C3-4 and C5-6 neural foraminal narrowing.   Mr Brain Wo Contrast 06/19/2017 IMPRESSION: Small acute left MCA infarct involving the insula.  TTE 06/19/2017 Study Conclusions - Left ventricle: The cavity size was mildly dilated. Wall thickness was increased in a pattern of mild LVH. Systolic function was moderately to severely reduced. The estimated ejection fraction was in the range of  30% to 35%. Diffuse hypokinesis. Doppler parameters are consistent with abnormal left ventricular relaxation (grade 1 diastolic dysfunction). - Mitral valve: Calcified annulus. Impressions: - Moderate to severe global reduction in LV systolic function; mild LVH; mild diastolic dysfunction; mild LVE.     PHYSICAL EXAM Pleasant middle aged lady not in distress. . Afebrile. Head is nontraumatic. Neck is supple without bruit.    Cardiac exam no murmur or gallop. Lungs are clear to auscultation. Distal pulses are well felt. Neurological Exam ;  Awake  Alert oriented x 3. Normal speech and language.No aphasia or dysarthria. eye movements full without nystagmus.fundi were not visualized. Vision acuity and fields appear normal. Hearing is normal. Palatal movements are normal. Face symmetric. Tongue midline. Normal strength, tone, reflexes and coordination. Normal sensation. Gait deferred.  ASSESSMENT/PLAN Elizabeth Bowman is a 66 y.o. female with history of diabetes and hypertension who presented to [facility] ED with complaints of AMS, confusion and aphasia. She did not receive IV t-PA due to arriving outside of the treatment window.   Stroke:  Small acute left MCA infarct involving the insula, likely embolic, in the setting of Severe stenosis LEFT M2 segment without emergent LVO and atherosclerosis with moderate stenosis RIGHT cavernous ICA.  Resultant  No deficits  CT head: no acute stroke.  Small LEFT temporal lobe acute infarct versus beam hardening artifact.  MRI head: Small acute left MCA infarct involving the insula  MRA head: not ordered  CTA head/neck: Severe L M2 stenosis without emergent LVO, bilateral paraophthalmic wide necked 4 mm aneurysms,  moderate R ICA stenosis   2D Echo: EF 30-35% with moderately to severely systolic function   LDL 116  HgbA1c 6.1  SCDs for VTE prophylaxis Diet heart healthy/carb modified Room service appropriate? Yes; Fluid consistency: Thin  No  antithrombotic prior to admission, now on aspirin 325 mg daily and clopidogrel 75 mg daily  Patient counseled to be compliant with her antithrombotic medications  Ongoing aggressive stroke risk factor management  Therapy recommendations: none  Disposition:  pending  Hypertension  Stable  Permissive hypertension (OK if < 220/120) but gradually normalize in 5-7 days  Long-term BP goal normotensive  Hyperlipidemia  Home meds:  Atorvastatin 40 mg PO daily  LDL 116, goal < 70  Increase atorvastatin dose to 80 mg PO daily  Continue new statin dose at discharge  Diabetes  HgbA1c 6.1, goal < 7.0  Controlled  Other Stroke Risk Factors  Advanced age  ETOH use, advised to drink no more than 1 drink(s) a day  Systolic heart failure  Other Active Problems  None  Hospital day # 1  I have personally examined this patient, reviewed notes, independently viewed imaging studies, participated in medical decision making and plan of care.ROS completed by me personally and pertinent positives fully documented  I have made any additions or clarifications directly to the above note. She presented with transient aphasia due to embolic left MCA branch infarct etiology to be due to mind. Recommend aspirin and Plavix and she may need TEE and prolonged cardiac monitoring. Continue  ongoing stroke workup. Greater than 50% time during this 35 minute visit was spent on counseling and coordination of care about her cryptogenic stroke, discussion about evaluation and treatment plan and answering questions Delia Heady, MD Medical Director [facility] Pager: [phone] 06/19/2017 8:18 PM   To contact Stroke Continuity provider, please refer to [email or web address]. After hours, contact General Neurology

## 2017-06-19 NOTE — Progress Notes (Signed)
Occupational Therapy Evaluation Patient Details Name: Elizabeth Bowman MRN: [id number] DOB: November 23, 1950 Today's Date: 06/19/2017    History of Present Illness pt is a 66 y/o female wit pmh significant for DM2, HTN and dyslipidemia.  pt noted ataxia, HA and dysarthria approx 12 hours prior to admission.  Pt came to ED for continued L sided HA.  MRI showed L MCA Infarct in insular area.   Clinical Impression   PTA, pt lived at home with her husband and was independent with ADL and mobility, including driving. Pt appears close to baseline level of function with mild incoordination deficits with R UE. Will follow acutely to educate on HEP and reducing risk of falls at home.     Follow Up Recommendations  No OT follow up;Supervision - Intermittent    Equipment Recommendations  3 in 1 bedside commode (to use as shower seat)    Recommendations for Other Services       Precautions / Restrictions Precautions Precautions: Fall (minimal fall risk)      Mobility Bed Mobility Overal bed mobility: Modified Independent                Transfers Overall transfer level: Modified independent                    Balance Overall balance assessment: No apparent balance deficits (not formally assessed)  Pt does state she feels lightheaded when bending over to touch feet.                              Standardized Balance Assessment Standardized Balance Assessment : Dynamic Gait Index   Dynamic Gait Index Level Surface: Normal Change in Gait Speed: Normal Gait with Horizontal Head Turns: Normal Gait with Vertical Head Turns: Normal Gait and Pivot Turn: Normal Step Over Obstacle: Mild Impairment Step Around Obstacles: Normal Steps: Mild Impairment Total Score: 22     ADL either performed or assessed with clinical judgement   ADL Overall ADL's : At baseline          Pt states she feels light headed when bending over to touch feet when simulating bathing. Discussed  option of using  3 in1 as shower chair to reduce risk of falls.                             General ADL Comments: Recommend pt has S with medication management and cooking. Pt verbalzied understanding.      Vision Baseline Vision/History: Wears glasses Wears Glasses: Reading only Vision Assessment?: No apparent visual deficits     Perception     Praxis      Pertinent Vitals/Pain Pain Assessment: 0-10 Pain Score: 3  Pain Location: L HA Pain Descriptors / Indicators: Aching Pain Intervention(s): Limited activity within patient's tolerance     Hand Dominance Right   Extremity/Trunk Assessment Upper Extremity Assessment Upper Extremity Assessment: RUE deficits/detail RUE Deficits / Details: mild incoordination deficits; noted pt dropped menu while talking to pt; pt staes she has been dropping things more frequently RUE Coordination: decreased fine motor;decreased gross motor   Lower Extremity Assessment Lower Extremity Assessment: Defer to PT evaluation   Cervical / Trunk Assessment Cervical / Trunk Assessment: Normal   Communication Communication Communication: Expressive difficulties (Pt states "i just can't say what I want sometimes")   Cognition Arousal/Alertness: Awake/alert Behavior During Therapy: WFL for tasks assessed/performed Overall Cognitive  Status: Within Functional Limits for tasks assessed Area of Impairment: Problem solving                             Problem Solving: Slow processing General Comments: moving "quickly", which is most likely baseline   General Comments  Educated pt on warning signs/symptoms of CVA using BeFast. Pt verbalzied understanding.    Exercises     Shoulder Instructions      Home Living Family/patient expects to be discharged to:: Private residence Living Arrangements: Spouse/significant other Available Help at Discharge: Family;Available PRN/intermittently (spouse works) Type of Home: House Home  Access: Stairs to enter Secretary/administrator of Steps: 6 Entrance Stairs-Rails: Right;Left Home Layout: One level     Bathroom Shower/Tub: Theme park manager: Yes How Accessible: Accessible via walker Home Equipment: None      Lives With: Spouse    Prior Functioning/Environment Level of Independence: Independent        Comments: driving, running errands        OT Problem List: Decreased coordination;Decreased safety awareness;Decreased knowledge of use of DME or AE      OT Treatment/Interventions: Self-care/ADL training;Therapeutic exercise;DME and/or AE instruction;Therapeutic activities;Patient/family education    OT Goals(Current goals can be found in the care plan section) Acute Rehab OT Goals Patient Stated Goal:  to be independent OT Goal Formulation: With patient Time For Goal Achievement: 07/03/17 Potential to Achieve Goals: Good  OT Frequency: Min 2X/week   Barriers to D/C:            Co-evaluation              AM-PAC PT "6 Clicks" Daily Activity     Outcome Measure Help from another person eating meals?: None Help from another person taking care of personal grooming?: None Help from another person toileting, which includes using toliet, bedpan, or urinal?: None Help from another person bathing (including washing, rinsing, drying)?: None Help from another person to put on and taking off regular upper body clothing?: None Help from another person to put on and taking off regular lower body clothing?: None 6 Click Score: 24   End of Session Nurse Communication: Mobility status  Activity Tolerance: Patient tolerated treatment well Patient left: in bed;with call bell/phone within reach  OT Visit Diagnosis: Unsteadiness on feet (R26.81);Muscle weakness (generalized) (M62.81)                Time: 1610-9604 OT Time Calculation (min): 26 min Charges:  OT General Charges $OT Visit: 1 Procedure OT  Evaluation $OT Eval Moderate Complexity: 1 Procedure OT Treatments $Self Care/Home Management : 8-22 mins G-Codes:     [facility], OT/L  [phone] 06/19/2017  [name],HILLARY 06/19/2017, 3:51 PM

## 2017-06-19 NOTE — Progress Notes (Signed)
Patient ID: Elizabeth Bowman, female   DOB: 27-Feb-1951, 66 y.o.   MRN: [id number]  PROGRESS NOTE    Elizabeth Bowman  WGN:[id number] DOB: [birth year] DOA: 06/17/2017 PCP: Patient, No Pcp Per   Brief Narrative:  66 year old female with history of diabetes mellitus, hypertension and dyslipidemia presented with ataxia, headache and dysarthria. She was found to have acute stroke. Neurology was consulted.   Assessment & Plan:   Principal Problem:   Stroke [facility]) Active Problems:   Diabetes mellitus type 2 in nonobese (HCC)   HLD (hyperlipidemia)   Acute stroke -CT evidence of possible stroke and associated left M2 stenosis - Follow MRI - Continue aspirin, statin, Plavix - Follow further recommendations from neurology - 2-D echo and carotid ultrasound - PT/OT/SLP evaluation and recommendations    Diabetes mellitus type 2 in nonobese  -Hemoglobin A1c 6.1 -Hold metformin -SSI    HLD (hyperlipidemia) -Continue Lipitor  Hypertension  -monitor blood pressure. Continue lisinopril and hydrochlorothiazide   DVT prophylaxis: Lovenox  Code Status: Full  Family Communication: Husband  Disposition Plan: Home in 1-2 days Consultants: Neurology  Procedures: Echo pending  Antimicrobials: None   Subjective: Patient seen and examined at bedside. She feels a little better, her speech seems to be improving. Overnight fever, nausea or vomiting.  Objective: Vitals:   06/19/17 0445 06/19/17 0448 06/19/17 1048 06/19/17 1436  BP: 134/68  (!) 168/63 (!) 146/70  Pulse: 60 (!) 53  63  Resp: 18   18  Temp: 97.7 F (36.5 C)   98.2 F (36.8 C)  TempSrc: Oral   Oral  SpO2: 98% 99%  100%  Weight:      Height:        Intake/Output Summary (Last 24 hours) at 06/19/17 1544 Last data filed at 06/19/17 1000  Gross per 24 hour  Intake              470 ml  Output              600 ml  Net             -130 ml   Filed Weights   06/17/17 2133 06/18/17 1547  Weight: 72.6 kg (160 lb) 76 kg (167 lb 9.6  oz)    Examination:  General exam: Appears calm and comfortable  Respiratory system: Bilateral decreased breath sound at bases Cardiovascular system: S1 & S2 heard, Rate controlled Gastrointestinal system: Abdomen is nondistended, soft and nontender. Normal bowel sounds heard. Central nervous system: Alert and oriented. No focal neurological deficits. Moving extremities. Speech is slightly slurred Extremities: No cyanosis, clubbing, edema      Data Reviewed: I have personally reviewed following labs and imaging studies  CBC:  Recent Labs Lab 06/17/17 2157  WBC 6.1  NEUTROABS 3.0  HGB 11.6*  HCT 34.4*  MCV 97.7  PLT 253   Basic Metabolic Panel:  Recent Labs Lab 06/17/17 2157  NA 139  K 3.2*  CL 103  CO2 25  GLUCOSE 136*  BUN 13  CREATININE 0.62  CALCIUM 9.4   GFR: Estimated Creatinine Clearance: 71.6 mL/min (by C-G formula based on SCr of 0.62 mg/dL). Liver Function Tests: No results for input(s): AST, ALT, ALKPHOS, BILITOT, PROT, ALBUMIN in the last 168 hours. No results for input(s): LIPASE, AMYLASE in the last 168 hours. No results for input(s): AMMONIA in the last 168 hours. Coagulation Profile:  Recent Labs Lab 06/17/17 2157  INR 1.06   Cardiac Enzymes:  Recent Labs Lab 06/17/17 2157  TROPONINI <0.03   BNP (last 3 results) No results for input(s): PROBNP in the last 8760 hours. HbA1C:  Recent Labs  06/18/17 1754  HGBA1C 6.1*   CBG:  Recent Labs Lab 06/18/17 1344 06/18/17 1654 06/18/17 2106 06/19/17 0746 06/19/17 1231  GLUCAP 139* 103* 206* 128* 91   Lipid Profile:  Recent Labs  06/19/17 0310  CHOL 180  HDL 48  LDLCALC 116*  TRIG 82  CHOLHDL 3.8   Thyroid Function Tests: No results for input(s): TSH, T4TOTAL, FREET4, T3FREE, THYROIDAB in the last 72 hours. Anemia Panel: No results for input(s): VITAMINB12, FOLATE, FERRITIN, TIBC, IRON, RETICCTPCT in the last 72 hours. Sepsis Labs: No results for input(s):  PROCALCITON, LATICACIDVEN in the last 168 hours.  No results found for this or any previous visit (from the past 240 hour(s)).       Radiology Studies: Ct Angio Head W Or Wo Contrast  Result Date: 06/17/2017 CLINICAL DATA:  Confusion and aphasia, last seen normal at 10 a.m. LEFT-sided headache. History of diabetes. EXAM: CT ANGIOGRAPHY HEAD AND NECK TECHNIQUE: Multidetector CT imaging of the head and neck was performed using the standard protocol during bolus administration of intravenous contrast. Multiplanar CT image reconstructions and MIPs were obtained to evaluate the vascular anatomy. Carotid stenosis measurements (when applicable) are obtained utilizing NASCET criteria, using the distal internal carotid diameter as the denominator. CONTRAST:  100 cc Isovue 370 COMPARISON:  None. FINDINGS: CT HEAD FINDINGS BRAIN: No intraparenchymal hemorrhage, mass effect nor midline shift. The ventricles and sulci are normal for age. Patchy supratentorial white matter hypodensities less than expected for patient's age, though non-specific are most compatible with chronic small vessel ischemic disease. Faint small hypodensity LEFT temporal lobe (axial 18/34). No abnormal extra-axial fluid collections. Basal cisterns are patent. VASCULAR: Moderate calcific atherosclerosis of the carotid siphons. SKULL: No skull fracture. Large tooth 13 periapical abscess. No significant scalp soft tissue swelling. SINUSES/ORBITS: Mild lobulated RIGHT maxillary sinus mucosal thickening without air-fluid levels. Mastoid air cells are well aerated. The included ocular globes and orbital contents are non-suspicious. OTHER: None. CTA NECK AORTIC ARCH: Normal appearance of the thoracic arch, normal branch pattern. Mild calcific atherosclerosis. The origins of the innominate, left Common carotid artery and subclavian artery are widely patent. RIGHT CAROTID SYSTEM: Common carotid artery is widely patent, mild calcific atherosclerosis.  Normal appearance of the carotid bifurcation without hemodynamically significant stenosis by NASCET criteria. Moderate atherosclerosis proximal LEFT internal carotid artery without stenosis. LEFT CAROTID SYSTEM: Common carotid artery is widely patent, mild calcific atherosclerosis. Patent carotid carotid bifurcation without hemodynamically significant stenosis by NASCET criteria, moderate eccentric calcific atherosclerosis. Normal appearance of the included internal carotid artery. VERTEBRAL ARTERIES:Codominant vertebral artery's. Mild calcific atherosclerosis proximal bilateral vertebral artery's which are patent throughout the course. Mild extrinsic compression due to degenerative cervical spine. SKELETON: No acute osseous process though bone windows have not been submitted. Severe C5-6 degenerative disc, moderate at C3-4. Moderate to severe RIGHT C3-4 and severe LEFT greater than RIGHT C5-6 neural foraminal narrowing. Moderate canal stenosis C5-6. OTHER NECK: Soft tissues of the neck are non-acute though, not tailored for evaluation. CTA HEAD ANTERIOR CIRCULATION: Patent cervical internal carotid arteries, petrous, cavernous and supra clinoid internal carotid arteries. Moderate stenosis RIGHT cavernous internal carotid artery. Wide necked 4 mm posteriorly directed aneurysm LEFT anterior genu (series 10, axial 245/349). Wide necked 4 mm laterally directed RIGHT paraophthalmic aneurysm. Widely patent anterior communicating artery. Patent anterior and middle cerebral arteries. Severe stenosis LEFT M2 segment, superior division.  No large vessel occlusion, contrast extravasation. POSTERIOR CIRCULATION: Patent vertebral arteries, vertebrobasilar junction and basilar artery, as well as main branch vessels. Patent posterior cerebral arteries, mild luminal regularity. No large vessel occlusion, significant stenosis, contrast extravasation or aneurysm. VENOUS SINUSES: Major dural venous sinuses are patent though not  tailored for evaluation on this angiographic examination. ANATOMIC VARIANTS: Aplastic RIGHT A1 segment, bilateral anterior cerebral arteries arise from LEFT A1-2 junction. DELAYED PHASE: No abnormal intracranial enhancement. MIP images reviewed. IMPRESSION: CT HEAD: 1. Small LEFT temporal lobe acute infarct versus beam hardening artifact. 2. Otherwise negative noncontrast CT HEAD for age. CTA NECK: 1. Atherosclerosis without hemodynamically significant stenosis or acute vascular process. 2. Degenerative cervical spine resulting in moderate canal stenosis C5-6. Severe C3-4 and C5-6 neural foraminal narrowing. CTA HEAD: 1. Severe stenosis LEFT M2 segment without emergent large vessel occlusion. 2. 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 3. Atherosclerosis with moderate stenosis RIGHT cavernous internal carotid artery. 4. Neuro-Interventional Radiology consultation is suggested to evaluate the appropriateness of potential treatment. Non-emergent evaluation can be arranged by calling [phone] during usual hours. Emergency evaluation can be requested by paging [phone]. Aortic Atherosclerosis (ICD10-I70.0). Electronically Signed   By: Awilda Metro M.D.   On: 06/17/2017 23:55   Dg Chest 2 View  Result Date: 06/17/2017 CLINICAL DATA:  Acute onset of confusion and aphasia. Left-sided headache. Initial encounter. EXAM: CHEST  2 VIEW COMPARISON:  None. FINDINGS: The lungs are well-aerated and clear. There is no evidence of focal opacification, pleural effusion or pneumothorax. The heart is borderline enlarged. No acute osseous abnormalities are seen. IMPRESSION: Chronic cardiomegaly.  Lungs remain grossly clear. Electronically Signed   By: Roanna Raider M.D.   On: 06/17/2017 23:41   Ct Angio Neck W And/or Wo Contrast  Result Date: 06/17/2017 CLINICAL DATA:  Confusion and aphasia, last seen normal at 10 a.m. LEFT-sided headache. History of diabetes. EXAM: CT ANGIOGRAPHY HEAD AND NECK  TECHNIQUE: Multidetector CT imaging of the head and neck was performed using the standard protocol during bolus administration of intravenous contrast. Multiplanar CT image reconstructions and MIPs were obtained to evaluate the vascular anatomy. Carotid stenosis measurements (when applicable) are obtained utilizing NASCET criteria, using the distal internal carotid diameter as the denominator. CONTRAST:  100 cc Isovue 370 COMPARISON:  None. FINDINGS: CT HEAD FINDINGS BRAIN: No intraparenchymal hemorrhage, mass effect nor midline shift. The ventricles and sulci are normal for age. Patchy supratentorial white matter hypodensities less than expected for patient's age, though non-specific are most compatible with chronic small vessel ischemic disease. Faint small hypodensity LEFT temporal lobe (axial 18/34). No abnormal extra-axial fluid collections. Basal cisterns are patent. VASCULAR: Moderate calcific atherosclerosis of the carotid siphons. SKULL: No skull fracture. Large tooth 13 periapical abscess. No significant scalp soft tissue swelling. SINUSES/ORBITS: Mild lobulated RIGHT maxillary sinus mucosal thickening without air-fluid levels. Mastoid air cells are well aerated. The included ocular globes and orbital contents are non-suspicious. OTHER: None. CTA NECK AORTIC ARCH: Normal appearance of the thoracic arch, normal branch pattern. Mild calcific atherosclerosis. The origins of the innominate, left Common carotid artery and subclavian artery are widely patent. RIGHT CAROTID SYSTEM: Common carotid artery is widely patent, mild calcific atherosclerosis. Normal appearance of the carotid bifurcation without hemodynamically significant stenosis by NASCET criteria. Moderate atherosclerosis proximal LEFT internal carotid artery without stenosis. LEFT CAROTID SYSTEM: Common carotid artery is widely patent, mild calcific atherosclerosis. Patent carotid carotid bifurcation without hemodynamically significant stenosis by  NASCET criteria, moderate eccentric calcific atherosclerosis. Normal appearance of  the included internal carotid artery. VERTEBRAL ARTERIES:Codominant vertebral artery's. Mild calcific atherosclerosis proximal bilateral vertebral artery's which are patent throughout the course. Mild extrinsic compression due to degenerative cervical spine. SKELETON: No acute osseous process though bone windows have not been submitted. Severe C5-6 degenerative disc, moderate at C3-4. Moderate to severe RIGHT C3-4 and severe LEFT greater than RIGHT C5-6 neural foraminal narrowing. Moderate canal stenosis C5-6. OTHER NECK: Soft tissues of the neck are non-acute though, not tailored for evaluation. CTA HEAD ANTERIOR CIRCULATION: Patent cervical internal carotid arteries, petrous, cavernous and supra clinoid internal carotid arteries. Moderate stenosis RIGHT cavernous internal carotid artery. Wide necked 4 mm posteriorly directed aneurysm LEFT anterior genu (series 10, axial 245/349). Wide necked 4 mm laterally directed RIGHT paraophthalmic aneurysm. Widely patent anterior communicating artery. Patent anterior and middle cerebral arteries. Severe stenosis LEFT M2 segment, superior division. No large vessel occlusion, contrast extravasation. POSTERIOR CIRCULATION: Patent vertebral arteries, vertebrobasilar junction and basilar artery, as well as main branch vessels. Patent posterior cerebral arteries, mild luminal regularity. No large vessel occlusion, significant stenosis, contrast extravasation or aneurysm. VENOUS SINUSES: Major dural venous sinuses are patent though not tailored for evaluation on this angiographic examination. ANATOMIC VARIANTS: Aplastic RIGHT A1 segment, bilateral anterior cerebral arteries arise from LEFT A1-2 junction. DELAYED PHASE: No abnormal intracranial enhancement. MIP images reviewed. IMPRESSION: CT HEAD: 1. Small LEFT temporal lobe acute infarct versus beam hardening artifact. 2. Otherwise negative  noncontrast CT HEAD for age. CTA NECK: 1. Atherosclerosis without hemodynamically significant stenosis or acute vascular process. 2. Degenerative cervical spine resulting in moderate canal stenosis C5-6. Severe C3-4 and C5-6 neural foraminal narrowing. CTA HEAD: 1. Severe stenosis LEFT M2 segment without emergent large vessel occlusion. 2. 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 3. Atherosclerosis with moderate stenosis RIGHT cavernous internal carotid artery. 4. Neuro-Interventional Radiology consultation is suggested to evaluate the appropriateness of potential treatment. Non-emergent evaluation can be arranged by calling [phone] during usual hours. Emergency evaluation can be requested by paging [phone]. Aortic Atherosclerosis (ICD10-I70.0). Electronically Signed   By: Awilda Metro M.D.   On: 06/17/2017 23:55   Mr Brain Wo Contrast  Result Date: 06/19/2017 CLINICAL DATA:  Headache, slurred speech, and ataxia. EXAM: MRI HEAD WITHOUT CONTRAST TECHNIQUE: Multiplanar, multiecho pulse sequences of the brain and surrounding structures were obtained without intravenous contrast. COMPARISON:  Head CT/CTA 06/17/2017 FINDINGS: Brain: There is a small acute left MCA territory infarct predominantly involving the posterior insula with a punctate infarct in the left corona radiata. No intracranial hemorrhage, mass, midline shift, or extra-axial fluid collection is identified. The ventricles and sulci are normal. No significant chronic white matter disease is seen for age. Vascular: Major intracranial vascular flow voids are preserved. Abnormal FLAIR signal is present in multiple distal left MCA inferior division branches likely reflecting reduced flow due to the high-grade proximal stenosis seen on CTA. Skull and upper cervical spine: Unremarkable bone marrow signal. Sinuses/Orbits: Unremarkable orbits. Mild right maxillary sinus mucosal thickening. Clear mastoid air cells. Other: None.  IMPRESSION: Small acute left MCA infarct involving the insula. Electronically Signed   By: Sebastian Ache M.D.   On: 06/19/2017 10:16        Scheduled Meds: . aspirin  300 mg Rectal Daily   Or  . aspirin  325 mg Oral Daily  . atorvastatin  80 mg Oral q1800  . clopidogrel  75 mg Oral Daily  . enoxaparin (LOVENOX) injection  40 mg Subcutaneous Q24H  . lisinopril  20 mg Oral Daily  And  . hydrochlorothiazide  12.5 mg Oral Daily  . insulin aspart  0-5 Units Subcutaneous QHS  . insulin aspart  0-9 Units Subcutaneous TID WC  . pantoprazole  40 mg Oral Daily   Continuous Infusions:   LOS: 1 day        Glade Lloyd, MD [facility] Pager [phone]  If 7PM-7AM, please contact night-coverage [email or web address] Password [facility] 06/19/2017, 3:44 PM

## 2017-06-19 NOTE — Evaluation (Signed)
Speech Language Pathology Evaluation Patient Details Name: Elizabeth Bowman MRN: [id number] DOB: Aug 04, 1951 Today's Date: 06/19/2017 Time: 1444-1500 SLP Time Calculation (min) (ACUTE ONLY): 16 min  Problem List:  Patient Active Problem List   Diagnosis Date Noted  . Stroke (HCC) 06/18/2017  . Diabetes mellitus without complication (HCC) 06/18/2017  . Diabetes mellitus type 2 in nonobese (HCC) 06/18/2017  . HLD (hyperlipidemia) 06/18/2017   Past Medical History:  Past Medical History:  Diagnosis Date  . Diabetes mellitus without complication (HCC)   . HTN (hypertension)    Past Surgical History: History reviewed. No pertinent surgical history. HPI:  Pt is a 66 y/o female with PMH significant for DM2, HTN and dyslipidemia. Pt noted ataxia, HA and dysarthria approx 12 hours prior to admission and found to have L MCA Infarct in insular area per MRI.   Assessment / Plan / Recommendation Clinical Impression  Pt assessed with parts of MOCA. She does not note changes in speech or cognition since event but states "memory is not great." She reports writing important information on calendar. Husband is responsible for finances. She is oriented with appropriate verbal problem solving, awareness, orientation. She denies visual impairments. Speech and language are typical and intelligible. No further ST needed.     SLP Assessment  SLP Recommendation/Assessment: Patient does not need any further Speech Lanaguage Pathology Services SLP Visit Diagnosis: Cognitive communication deficit (R41.841)    Follow Up Recommendations  None    Frequency and Duration           SLP Evaluation Cognition  Overall Cognitive Status: Within Functional Limits for tasks assessed Arousal/Alertness: Awake/alert Orientation Level: Oriented X4 Attention: Sustained Sustained Attention: Appears intact Memory:  (not formally assessed, pt states difficulty with memory) Awareness: Appears intact Problem Solving: Appears  intact Safety/Judgment: Appears intact       Comprehension  Auditory Comprehension Overall Auditory Comprehension: Appears within functional limits for tasks assessed Visual Recognition/Discrimination Discrimination: Not tested Reading Comprehension Reading Status: Not tested    Expression Expression Primary Mode of Expression: Verbal Verbal Expression Overall Verbal Expression: Appears within functional limits for tasks assessed Level of Generative/Spontaneous Verbalization: Conversation Repetition:  (NT) Naming: Not tested Pragmatics: No impairment Written Expression Dominant Hand: Right Written Expression: Not tested   Oral / Motor  Oral Motor/Sensory Function Overall Oral Motor/Sensory Function: Within functional limits Motor Speech Overall Motor Speech: Appears within functional limits for tasks assessed Intelligibility: Intelligible Motor Planning: Witnin functional limits   GO                    Royce Macadamia 06/19/2017, 3:26 PM   Breck Coons Roman Dubuc M.Ed [employer] [phone]

## 2017-06-19 NOTE — Evaluation (Signed)
Physical Therapy Evaluation Patient Details Name: Elizabeth Bowman MRN: [id number] DOB: 12-22-1950 Today's Date: 06/19/2017   History of Present Illness  pt is a 66 y/o female wit pmh significant for DM2, HTN and dyslipidemia.  pt noted ataxia, HA and dysarthria approx 12 hours prior to admission.  Pt came to ED for continued L sided HA.  MRI showed L MCA Infarct in insular area.  Clinical Impression  Pt is at or close to baseline functioning and should be safe at home with available level of family assist.  Gait and general mobility safe at mod I/ independence, but noted slowed processing at times and ?mild UE incoordination. There are no further acute PT needs.  Will sign off at this time.     Follow Up Recommendations No PT follow up;Supervision - Intermittent    Equipment Recommendations  None recommended by PT    Recommendations for Other Services       Precautions / Restrictions Precautions Precautions:  (minimal fall risk)      Mobility  Bed Mobility Overal bed mobility: Modified Independent                Transfers Overall transfer level: Modified independent                  Ambulation/Gait Ambulation/Gait assistance: Modified independent (Device/Increase time) Ambulation Distance (Feet): 300 Feet Assistive device: None Gait Pattern/deviations: Step-through pattern   Gait velocity interpretation: at or above normal speed for age/gender General Gait Details: generally steady with ability to maneuver without deviation at age appropriate speeds.  Stairs Stairs: Yes Stairs assistance: Independent Stair Management: No rails;One rail Right;Alternating pattern;Step to pattern;Forwards Number of Stairs: 6 General stair comments: steady, but safer with rail  Wheelchair Mobility    Modified Rankin (Stroke Patients Only)       Balance Overall balance assessment: No apparent balance deficits (not formally assessed)                                Standardized Balance Assessment Standardized Balance Assessment : Dynamic Gait Index   Dynamic Gait Index Level Surface: Normal Change in Gait Speed: Normal Gait with Horizontal Head Turns: Normal Gait with Vertical Head Turns: Normal Gait and Pivot Turn: Normal Step Over Obstacle: Mild Impairment Step Around Obstacles: Normal Steps: Mild Impairment Total Score: 22       Pertinent Vitals/Pain Pain Assessment: 0-10 Pain Score: 1  Pain Location: L HA Pain Descriptors / Indicators: Aching Pain Intervention(s): Monitored during session    Home Living Family/patient expects to be discharged to:: Private residence Living Arrangements: Spouse/significant other Available Help at Discharge: Family;Available PRN/intermittently (spouse works) Type of Home: House Home Access: Stairs to enter Entrance Stairs-Rails: Doctor, general practice of Steps: 6 Home Layout: One level Home Equipment: None      Prior Function Level of Independence: Independent         Comments: driving, running errands     Hand Dominance        Extremity/Trunk Assessment   Upper Extremity Assessment Upper Extremity Assessment: Defer to OT evaluation    Lower Extremity Assessment Lower Extremity Assessment: Overall WFL for tasks assessed (L LE with mild proximal weakness)    Cervical / Trunk Assessment Cervical / Trunk Assessment: Normal  Communication   Communication: Receptive difficulties;Expressive difficulties;Other (comment) (very mild in normal conversation)  Cognition Arousal/Alertness: Awake/alert Behavior During Therapy: WFL for tasks assessed/performed Overall Cognitive Status: Within  Functional Limits for tasks assessed Area of Impairment: Problem solving                             Problem Solving: Slow processing        General Comments General comments (skin integrity, edema, etc.): low risk of falls based on DGI, but noted pt slow to  comprehend the commands given.    Exercises     Assessment/Plan    PT Assessment Patent does not need any further PT services  PT Problem List         PT Treatment Interventions      PT Goals (Current goals can be found in the Care Plan section)  Acute Rehab PT Goals PT Goal Formulation: All assessment and education complete, DC therapy    Frequency     Barriers to discharge        Co-evaluation               AM-PAC PT "6 Clicks" Daily Activity  Outcome Measure Difficulty turning over in bed (including adjusting bedclothes, sheets and blankets)?: None Difficulty moving from lying on back to sitting on the side of the bed? : None Difficulty sitting down on and standing up from a chair with arms (e.g., wheelchair, bedside commode, etc,.)?: None Help needed moving to and from a bed to chair (including a wheelchair)?: None Help needed walking in hospital room?: None Help needed climbing 3-5 steps with a railing? : None 6 Click Score: 24    End of Session   Activity Tolerance: Patient tolerated treatment well Patient left: in bed;with call bell/phone within reach Nurse Communication: Mobility status PT Visit Diagnosis: Other abnormalities of gait and mobility (R26.89)    Time: 0375-4360 PT Time Calculation (min) (ACUTE ONLY): 24 min   Charges:   PT Evaluation $PT Eval Low Complexity: 1 Low PT Treatments $Gait Training: 8-22 mins   PT G Codes:        July 13, 2017  Nunda Bing, PT [phone]  (pager)  Eliseo Gum Krithi Bray 07/13/2017, 1:08 PM

## 2017-06-19 NOTE — Progress Notes (Signed)
  Echocardiogram 2D Echocardiogram has been performed.  Elizabeth Bowman 06/19/2017, 5:07 PM

## 2017-06-20 ENCOUNTER — Encounter (HOSPITAL_COMMUNITY): Payer: Self-pay

## 2017-06-20 ENCOUNTER — Inpatient Hospital Stay (HOSPITAL_COMMUNITY): Payer: Medicare Other

## 2017-06-20 DIAGNOSIS — I420 Dilated cardiomyopathy: Secondary | ICD-10-CM

## 2017-06-20 DIAGNOSIS — I429 Cardiomyopathy, unspecified: Secondary | ICD-10-CM

## 2017-06-20 DIAGNOSIS — I6300 Cerebral infarction due to thrombosis of unspecified precerebral artery: Secondary | ICD-10-CM

## 2017-06-20 LAB — CBC WITH DIFFERENTIAL/PLATELET
BASOS PCT: 0 %
Basophils Absolute: 0 10*3/uL (ref 0.0–0.1)
Eosinophils Absolute: 0 10*3/uL (ref 0.0–0.7)
Eosinophils Relative: 1 %
HEMATOCRIT: 35.4 % — AB (ref 36.0–46.0)
Hemoglobin: 11.9 g/dL — ABNORMAL LOW (ref 12.0–15.0)
Lymphocytes Relative: 41 %
Lymphs Abs: 2.9 10*3/uL (ref 0.7–4.0)
MCH: 31.6 pg (ref 26.0–34.0)
MCHC: 33.6 g/dL (ref 30.0–36.0)
MCV: 94.1 fL (ref 78.0–100.0)
MONOS PCT: 6 %
Monocytes Absolute: 0.4 10*3/uL (ref 0.1–1.0)
NEUTROS ABS: 3.8 10*3/uL (ref 1.7–7.7)
Neutrophils Relative %: 52 %
Platelets: 262 10*3/uL (ref 150–400)
RBC: 3.76 MIL/uL — AB (ref 3.87–5.11)
RDW: 12.3 % (ref 11.5–15.5)
WBC: 7.2 10*3/uL (ref 4.0–10.5)

## 2017-06-20 LAB — VAS US CAROTID
LEFT ECA DIAS: -5 cm/s
LEFT VERTEBRAL DIAS: -17 cm/s
Left CCA dist dias: 22 cm/s
Left CCA dist sys: 68 cm/s
Left CCA prox dias: 32 cm/s
Left CCA prox sys: 116 cm/s
Left ICA dist dias: -45 cm/s
Left ICA dist sys: -104 cm/s
Left ICA prox dias: -21 cm/s
Left ICA prox sys: -55 cm/s
RCCADSYS: -116 cm/s
RCCAPDIAS: 17 cm/s
RIGHT ECA DIAS: -5 cm/s
RIGHT VERTEBRAL DIAS: -17 cm/s
Right CCA prox sys: 110 cm/s

## 2017-06-20 LAB — MAGNESIUM: Magnesium: 2 mg/dL (ref 1.7–2.4)

## 2017-06-20 LAB — BASIC METABOLIC PANEL
ANION GAP: 8 (ref 5–15)
BUN: 17 mg/dL (ref 6–20)
CALCIUM: 9.2 mg/dL (ref 8.9–10.3)
CHLORIDE: 104 mmol/L (ref 101–111)
CO2: 27 mmol/L (ref 22–32)
Creatinine, Ser: 0.8 mg/dL (ref 0.44–1.00)
GLUCOSE: 100 mg/dL — AB (ref 65–99)
POTASSIUM: 3.2 mmol/L — AB (ref 3.5–5.1)
SODIUM: 139 mmol/L (ref 135–145)

## 2017-06-20 LAB — GLUCOSE, CAPILLARY
GLUCOSE-CAPILLARY: 107 mg/dL — AB (ref 65–99)
GLUCOSE-CAPILLARY: 147 mg/dL — AB (ref 65–99)
Glucose-Capillary: 96 mg/dL (ref 65–99)
Glucose-Capillary: 160 mg/dL — ABNORMAL HIGH (ref 65–99)

## 2017-06-20 LAB — TSH: TSH: 2.701 u[IU]/mL (ref 0.350–4.500)

## 2017-06-20 MED ORDER — POTASSIUM CHLORIDE CRYS ER 20 MEQ PO TBCR
40.0000 meq | EXTENDED_RELEASE_TABLET | Freq: Two times a day (BID) | ORAL | Status: AC
  Administered 2017-06-20 (×2): 40 meq via ORAL
  Filled 2017-06-20: qty 2
  Filled 2017-06-20: qty 4

## 2017-06-20 NOTE — Progress Notes (Signed)
OT Note - Addendum    06/20/17 1300  OT Visit Information  Last OT Received On 06/20/17  OT Time Calculation  OT Start Time (ACUTE ONLY) 1025  OT Stop Time (ACUTE ONLY) 1040  OT Time Calculation (min) 15 min  OT Treatments  $Self Care/Home Management  8-22 mins  [facility], OT/L  [phone] 06/20/2017

## 2017-06-20 NOTE — Consult Note (Signed)
Cardiology Consultation:   Patient ID: Elizabeth Bowman; [id number]; 03-Feb-1951   Admit date: 06/17/2017 Date of Consult: 06/20/2017  Primary Care Provider: Patient, No Pcp Per Primary Cardiologist: New to Dr. Anne Fu  Chief Complaint: difficulty speaking  Patient Profile:   Elizabeth Bowman is a 66 y.o. female with a hx of DM (A1C 6.1), HTN, 20-30 years of prior tobacco abuse, dyslipidemia who is being seen today for the evaluation of newly recognized LV dysfunction at the request of Dr. Hanley Ben.  History of Present Illness:   She has no prior cardiac history or cardiac workup. She has been in her usual state of health recently which was feeling well. On day of admission she developed ataxia, severe headache, dysarthria and slurred speech. She did not wish to come to the doctor so she did not present for treatment until ~12 hours later apparently because of persistent headache. CT angio head/neck possible acute left temporal infarct, severe stenosis of the L M2 segment, bilateral paraophthalmic wide necked 4 mm aneurysms, moderate stenosis of right cavernous internal carotid artery; MR brain showed small acute L MCA infarct. She was outside of the window for TPA thus code stroke was not initiated. Radiologist recommended neuro interventional radiology consultation for nonemergent treatment options. As part of her workup, 2D Echo 06/19/17 showed moderate-severe global reduction in EF to 30-35%, mild LVH, mild diastolic dysfunction, mild LV enlargement, calcified mitral annulus. Labs notable for hypokalemia of 3.2, Hgb 11.6, troponin neg x 1, A1C 6.1, EtOH and UDS wnl, LDL 116. CXR shows cardiomegaly, grossly clear lungs. She denies any recent or prior chest pain, dyspnea, LEE, orthopnea, PND, palpitations or syncope. Her daughter felt like the patient has been more tired than usual. She has no family history of CAD or CHF. She feels like she is back to baseline. Telemetry shows NSR, HR 50s-60s.   Past Medical  History:  Diagnosis Date  . Diabetes mellitus (HCC)   . Former tobacco use   . HTN (hypertension)   . Hyperlipidemia     History reviewed. No pertinent surgical history.   Inpatient Medications: Scheduled Meds: . aspirin  300 mg Rectal Daily   Or  . aspirin  325 mg Oral Daily  . atorvastatin  80 mg Oral q1800  . clopidogrel  75 mg Oral Daily  . enoxaparin (LOVENOX) injection  40 mg Subcutaneous Q24H  . lisinopril  20 mg Oral Daily   And  . hydrochlorothiazide  12.5 mg Oral Daily  . insulin aspart  0-5 Units Subcutaneous QHS  . insulin aspart  0-9 Units Subcutaneous TID WC  . pantoprazole  40 mg Oral Daily  . potassium chloride  40 mEq Oral BID   Continuous Infusions:  PRN Meds: acetaminophen **OR** acetaminophen (TYLENOL) oral liquid 160 mg/5 mL **OR** acetaminophen, ondansetron (ZOFRAN) IV **OR** promethazine  Allergies:   No Known Allergies  Social History:   Social History   Social History  . Marital status: Married    Spouse name: N/A  . Number of children: N/A  . Years of education: N/A   Occupational History  . Not on file.   Social History Main Topics  . Smoking status: Former Smoker    Quit date: 06/19/2007  . Smokeless tobacco: Former Neurosurgeon     Comment: Smoked for 20-30 years  . Alcohol use Yes     Comment: occasional drink a few times a month, not regularly  . Drug use: No  . Sexual activity: Yes    Partners:  Female   Other Topics Concern  . Not on file   Social History Narrative  . No narrative on file    Family History:   The patient's family history includes Cancer in her brother and father. There is no history of CAD, Heart attack, or Congestive Heart Failure.  ROS:  Please see the history of present illness.  All other ROS reviewed and negative.     Physical Exam/Data:   Vitals:   06/19/17 2105 06/19/17 2107 06/20/17 0558 06/20/17 0812  BP: (!) 155/74 (!) 152/66 (!) 128/59 116/62  Pulse: 64 62 (!) 58 60  Resp: 17  16   Temp: 98  F (36.7 C)  98 F (36.7 C)   TempSrc: Oral  Oral   SpO2: 99% 98% 97%   Weight:      Height:        Intake/Output Summary (Last 24 hours) at 06/20/17 1114 Last data filed at 06/20/17 0210  Gross per 24 hour  Intake                0 ml  Output             1250 ml  Net            -1250 ml   Filed Weights   06/17/17 2133 06/18/17 1547  Weight: 160 lb (72.6 kg) 167 lb 9.6 oz (76 kg)   Body mass index is 26.25 kg/m.  General: Well developed, well nourished AAF, in no acute distress. Head: Normocephalic, atraumatic, sclera non-icteric, no xanthomas, nares are without discharge. Neck: Negative for carotid bruits. JVD not elevated. Lungs: Clear bilaterally to auscultation without wheezes, rales, or rhonchi. Breathing is unlabored. Heart: RRR with S1 S2. No murmurs, rubs, or gallops appreciated. Abdomen: Soft, non-tender, non-distended with normoactive bowel sounds. No hepatomegaly. No rebound/guarding. No obvious abdominal masses. Msk:  Strength and tone appear normal for age. Extremities: No clubbing or cyanosis. No edema.  Distal pedal pulses are 2+ and equal bilaterally. Neuro: Alert and oriented X 3. No facial asymmetry. No focal deficit. Moves all extremities spontaneously. Psych:  Responds to questions appropriately with a normal affect.  EKG:  The EKG was personally reviewed and demonstrates NSR, mild LVH, no acute ST-T changes, base artifact present but clearly NSR in lead I  Relevant CV Studies: 2d echo 06/20/17 Study Conclusions  - Left ventricle: The cavity size was mildly dilated. Wall   thickness was increased in a pattern of mild LVH. Systolic   function was moderately to severely reduced. The estimated   ejection fraction was in the range of 30% to 35%. Diffuse   hypokinesis. Doppler parameters are consistent with abnormal left   ventricular relaxation (grade 1 diastolic dysfunction). - Mitral valve: Calcified annulus. Impressions: - Moderate to severe global  reduction in LV systolic function; mild   LVH; mild diastolic dysfunction; mild LVE.  Laboratory Data:  Chemistry  Recent Labs Lab 06/17/17 2157 06/20/17 0600  NA 139 139  K 3.2* 3.2*  CL 103 104  CO2 25 27  GLUCOSE 136* 100*  BUN 13 17  CREATININE 0.62 0.80  CALCIUM 9.4 9.2  GFRNONAA >60 >60  GFRAA >60 >60  ANIONGAP 11 8     Hematology  Recent Labs Lab 06/17/17 2157 06/20/17 0600  WBC 6.1 7.2  RBC 3.52* 3.76*  HGB 11.6* 11.9*  HCT 34.4* 35.4*  MCV 97.7 94.1  MCH 33.0 31.6  MCHC 33.7 33.6  RDW 11.8 12.3  PLT 253  262   Cardiac Enzymes  Recent Labs Lab 06/17/17 2157  TROPONINI <0.03   Radiology/Studies:  Ct Angio Head W Or Wo Contrast  Result Date: 06/17/2017 CLINICAL DATA:  Confusion and aphasia, last seen normal at 10 a.m. LEFT-sided headache. History of diabetes. EXAM: CT ANGIOGRAPHY HEAD AND NECK TECHNIQUE: Multidetector CT imaging of the head and neck was performed using the standard protocol during bolus administration of intravenous contrast. Multiplanar CT image reconstructions and MIPs were obtained to evaluate the vascular anatomy. Carotid stenosis measurements (when applicable) are obtained utilizing NASCET criteria, using the distal internal carotid diameter as the denominator. CONTRAST:  100 cc Isovue 370 COMPARISON:  None. FINDINGS: CT HEAD FINDINGS BRAIN: No intraparenchymal hemorrhage, mass effect nor midline shift. The ventricles and sulci are normal for age. Patchy supratentorial white matter hypodensities less than expected for patient's age, though non-specific are most compatible with chronic small vessel ischemic disease. Faint small hypodensity LEFT temporal lobe (axial 18/34). No abnormal extra-axial fluid collections. Basal cisterns are patent. VASCULAR: Moderate calcific atherosclerosis of the carotid siphons. SKULL: No skull fracture. Large tooth 13 periapical abscess. No significant scalp soft tissue swelling. SINUSES/ORBITS: Mild lobulated  RIGHT maxillary sinus mucosal thickening without air-fluid levels. Mastoid air cells are well aerated. The included ocular globes and orbital contents are non-suspicious. OTHER: None. CTA NECK AORTIC ARCH: Normal appearance of the thoracic arch, normal branch pattern. Mild calcific atherosclerosis. The origins of the innominate, left Common carotid artery and subclavian artery are widely patent. RIGHT CAROTID SYSTEM: Common carotid artery is widely patent, mild calcific atherosclerosis. Normal appearance of the carotid bifurcation without hemodynamically significant stenosis by NASCET criteria. Moderate atherosclerosis proximal LEFT internal carotid artery without stenosis. LEFT CAROTID SYSTEM: Common carotid artery is widely patent, mild calcific atherosclerosis. Patent carotid carotid bifurcation without hemodynamically significant stenosis by NASCET criteria, moderate eccentric calcific atherosclerosis. Normal appearance of the included internal carotid artery. VERTEBRAL ARTERIES:Codominant vertebral artery's. Mild calcific atherosclerosis proximal bilateral vertebral artery's which are patent throughout the course. Mild extrinsic compression due to degenerative cervical spine. SKELETON: No acute osseous process though bone windows have not been submitted. Severe C5-6 degenerative disc, moderate at C3-4. Moderate to severe RIGHT C3-4 and severe LEFT greater than RIGHT C5-6 neural foraminal narrowing. Moderate canal stenosis C5-6. OTHER NECK: Soft tissues of the neck are non-acute though, not tailored for evaluation. CTA HEAD ANTERIOR CIRCULATION: Patent cervical internal carotid arteries, petrous, cavernous and supra clinoid internal carotid arteries. Moderate stenosis RIGHT cavernous internal carotid artery. Wide necked 4 mm posteriorly directed aneurysm LEFT anterior genu (series 10, axial 245/349). Wide necked 4 mm laterally directed RIGHT paraophthalmic aneurysm. Widely patent anterior communicating artery.  Patent anterior and middle cerebral arteries. Severe stenosis LEFT M2 segment, superior division. No large vessel occlusion, contrast extravasation. POSTERIOR CIRCULATION: Patent vertebral arteries, vertebrobasilar junction and basilar artery, as well as main branch vessels. Patent posterior cerebral arteries, mild luminal regularity. No large vessel occlusion, significant stenosis, contrast extravasation or aneurysm. VENOUS SINUSES: Major dural venous sinuses are patent though not tailored for evaluation on this angiographic examination. ANATOMIC VARIANTS: Aplastic RIGHT A1 segment, bilateral anterior cerebral arteries arise from LEFT A1-2 junction. DELAYED PHASE: No abnormal intracranial enhancement. MIP images reviewed. IMPRESSION: CT HEAD: 1. Small LEFT temporal lobe acute infarct versus beam hardening artifact. 2. Otherwise negative noncontrast CT HEAD for age. CTA NECK: 1. Atherosclerosis without hemodynamically significant stenosis or acute vascular process. 2. Degenerative cervical spine resulting in moderate canal stenosis C5-6. Severe C3-4 and C5-6 neural foraminal narrowing.  CTA HEAD: 1. Severe stenosis LEFT M2 segment without emergent large vessel occlusion. 2. 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 3. Atherosclerosis with moderate stenosis RIGHT cavernous internal carotid artery. 4. Neuro-Interventional Radiology consultation is suggested to evaluate the appropriateness of potential treatment. Non-emergent evaluation can be arranged by calling [phone] during usual hours. Emergency evaluation can be requested by paging [phone]. Aortic Atherosclerosis (ICD10-I70.0). Electronically Signed   By: Awilda Metro M.D.   On: 06/17/2017 23:55   Dg Chest 2 View  Result Date: 06/17/2017 CLINICAL DATA:  Acute onset of confusion and aphasia. Left-sided headache. Initial encounter. EXAM: CHEST  2 VIEW COMPARISON:  None. FINDINGS: The lungs are well-aerated and clear. There is no  evidence of focal opacification, pleural effusion or pneumothorax. The heart is borderline enlarged. No acute osseous abnormalities are seen. IMPRESSION: Chronic cardiomegaly.  Lungs remain grossly clear. Electronically Signed   By: Roanna Raider M.D.   On: 06/17/2017 23:41   Ct Angio Neck W And/or Wo Contrast  Result Date: 06/17/2017 CLINICAL DATA:  Confusion and aphasia, last seen normal at 10 a.m. LEFT-sided headache. History of diabetes. EXAM: CT ANGIOGRAPHY HEAD AND NECK TECHNIQUE: Multidetector CT imaging of the head and neck was performed using the standard protocol during bolus administration of intravenous contrast. Multiplanar CT image reconstructions and MIPs were obtained to evaluate the vascular anatomy. Carotid stenosis measurements (when applicable) are obtained utilizing NASCET criteria, using the distal internal carotid diameter as the denominator. CONTRAST:  100 cc Isovue 370 COMPARISON:  None. FINDINGS: CT HEAD FINDINGS BRAIN: No intraparenchymal hemorrhage, mass effect nor midline shift. The ventricles and sulci are normal for age. Patchy supratentorial white matter hypodensities less than expected for patient's age, though non-specific are most compatible with chronic small vessel ischemic disease. Faint small hypodensity LEFT temporal lobe (axial 18/34). No abnormal extra-axial fluid collections. Basal cisterns are patent. VASCULAR: Moderate calcific atherosclerosis of the carotid siphons. SKULL: No skull fracture. Large tooth 13 periapical abscess. No significant scalp soft tissue swelling. SINUSES/ORBITS: Mild lobulated RIGHT maxillary sinus mucosal thickening without air-fluid levels. Mastoid air cells are well aerated. The included ocular globes and orbital contents are non-suspicious. OTHER: None. CTA NECK AORTIC ARCH: Normal appearance of the thoracic arch, normal branch pattern. Mild calcific atherosclerosis. The origins of the innominate, left Common carotid artery and subclavian  artery are widely patent. RIGHT CAROTID SYSTEM: Common carotid artery is widely patent, mild calcific atherosclerosis. Normal appearance of the carotid bifurcation without hemodynamically significant stenosis by NASCET criteria. Moderate atherosclerosis proximal LEFT internal carotid artery without stenosis. LEFT CAROTID SYSTEM: Common carotid artery is widely patent, mild calcific atherosclerosis. Patent carotid carotid bifurcation without hemodynamically significant stenosis by NASCET criteria, moderate eccentric calcific atherosclerosis. Normal appearance of the included internal carotid artery. VERTEBRAL ARTERIES:Codominant vertebral artery's. Mild calcific atherosclerosis proximal bilateral vertebral artery's which are patent throughout the course. Mild extrinsic compression due to degenerative cervical spine. SKELETON: No acute osseous process though bone windows have not been submitted. Severe C5-6 degenerative disc, moderate at C3-4. Moderate to severe RIGHT C3-4 and severe LEFT greater than RIGHT C5-6 neural foraminal narrowing. Moderate canal stenosis C5-6. OTHER NECK: Soft tissues of the neck are non-acute though, not tailored for evaluation. CTA HEAD ANTERIOR CIRCULATION: Patent cervical internal carotid arteries, petrous, cavernous and supra clinoid internal carotid arteries. Moderate stenosis RIGHT cavernous internal carotid artery. Wide necked 4 mm posteriorly directed aneurysm LEFT anterior genu (series 10, axial 245/349). Wide necked 4 mm laterally directed RIGHT paraophthalmic aneurysm. Widely patent anterior  communicating artery. Patent anterior and middle cerebral arteries. Severe stenosis LEFT M2 segment, superior division. No large vessel occlusion, contrast extravasation. POSTERIOR CIRCULATION: Patent vertebral arteries, vertebrobasilar junction and basilar artery, as well as main branch vessels. Patent posterior cerebral arteries, mild luminal regularity. No large vessel occlusion,  significant stenosis, contrast extravasation or aneurysm. VENOUS SINUSES: Major dural venous sinuses are patent though not tailored for evaluation on this angiographic examination. ANATOMIC VARIANTS: Aplastic RIGHT A1 segment, bilateral anterior cerebral arteries arise from LEFT A1-2 junction. DELAYED PHASE: No abnormal intracranial enhancement. MIP images reviewed. IMPRESSION: CT HEAD: 1. Small LEFT temporal lobe acute infarct versus beam hardening artifact. 2. Otherwise negative noncontrast CT HEAD for age. CTA NECK: 1. Atherosclerosis without hemodynamically significant stenosis or acute vascular process. 2. Degenerative cervical spine resulting in moderate canal stenosis C5-6. Severe C3-4 and C5-6 neural foraminal narrowing. CTA HEAD: 1. Severe stenosis LEFT M2 segment without emergent large vessel occlusion. 2. 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 3. Atherosclerosis with moderate stenosis RIGHT cavernous internal carotid artery. 4. Neuro-Interventional Radiology consultation is suggested to evaluate the appropriateness of potential treatment. Non-emergent evaluation can be arranged by calling [phone] during usual hours. Emergency evaluation can be requested by paging [phone]. Aortic Atherosclerosis (ICD10-I70.0). Electronically Signed   By: Awilda Metro M.D.   On: 06/17/2017 23:55   Mr Brain Wo Contrast  Result Date: 06/19/2017 CLINICAL DATA:  Headache, slurred speech, and ataxia. EXAM: MRI HEAD WITHOUT CONTRAST TECHNIQUE: Multiplanar, multiecho pulse sequences of the brain and surrounding structures were obtained without intravenous contrast. COMPARISON:  Head CT/CTA 06/17/2017 FINDINGS: Brain: There is a small acute left MCA territory infarct predominantly involving the posterior insula with a punctate infarct in the left corona radiata. No intracranial hemorrhage, mass, midline shift, or extra-axial fluid collection is identified. The ventricles and sulci are normal. No  significant chronic white matter disease is seen for age. Vascular: Major intracranial vascular flow voids are preserved. Abnormal FLAIR signal is present in multiple distal left MCA inferior division branches likely reflecting reduced flow due to the high-grade proximal stenosis seen on CTA. Skull and upper cervical spine: Unremarkable bone marrow signal. Sinuses/Orbits: Unremarkable orbits. Mild right maxillary sinus mucosal thickening. Clear mastoid air cells. Other: None. IMPRESSION: Small acute left MCA infarct involving the insula. Electronically Signed   By: Sebastian Ache M.D.   On: 06/19/2017 10:16    Assessment and Plan:   1. Acute stroke - etiology not totally clear. Neuro has recommended ASA/Plavix for now. She was not on any aspirin prior to admission. Plan TEE tomorrow at 3pm with Dr. Delton See. The risks and benefits of transesophageal echocardiogram have been explained to the patient who is agreeable to proceed. Will keep NPO after midnight - orders are under sign/held. The question going forward regarding optimal treatment of her stroke is not really clear. Per UpToDate, there is a class 2c indication for anticoagulation with warfarin in patients with LV dysfunction in sinus rhythm with thromboembolic event, presumably due to risk of stasis and assumption of LV clot. There is no formal recommendation of this from the ACC/AHA from the last 2013 or 2017 focused heart failure guidelines, just that treatment should be individualized. This patient also has newly diagnosed cerebrovascular aneurysms outlined above so I am not sure she would be a candidate for warfarin. She will need input from neurology and neurosurgery regarding this. I spoke with Dr. Hanley Ben who will touch base with Dr. Pearlean Brownie. I will also discuss event monitoring versus loop with  Dr. Anne Fu who will see the patient with me. If neurology feels that the patient should be on warfarin, loop recorder would not need to be placed.  2. Newly  recognized cardiomyopathy without clinical signs of heart failure - etiology unknown. She is not describing any anginal sx. She has multiple cardiac risk factors including DM, HLD, and prior tobacco abuse. She is not a candidate for invasive ischemic workup given recent acute stroke but may benefit from nuclear stress test or even cardiac CTA to risk stratify. She is on ACEI already as well as HCTZ. Would consider switching HCTZ to spironolactone given LV dysfunction and hypokalemia. Her most recent blood pressure will not allow for any further med titration today. Baseline HR appears to be mid 50s-70s on telemetry so could consider low dose carvedilol if she begins to show higher trends. Check TSH. Will review further with MD.  3. Essential HTN - being managed by primary team in the context of #1.  4. Hyperlipidemia - statin titrated this admission. If the patient is tolerating statin at time of follow-up, would consider rechecking liver function/lipid panel in 6-8 weeks.  5. Hypokalemia - is being repleted by primary team.  Signed, Laurann Montana, PA-C  06/20/2017 11:14 AM   Personally seen and examined. Agree with above.  66 year old female with ejection fraction 30%, dilated cardiomyopathy in the setting of stroke. Currently conversant, complaining of headache prolonged. No chest pain, no shortness of breath. Lungs are clear, abdomen soft, no significant edema, heart regular rate and rhythm  Dilated cardiomyopathy discovered in the setting of acute stroke  - It seems reasonable to empirically place on warfarin with her reduced ejection fraction and acute stroke when felt safe to do so by neurology as secondary infection. She currently is in sinus rhythm. If there is concern about utilization of warfarin in this setting, it would also be reasonable to place loop recorder to search for atrial fibrillation as we usually do with patients and normal ejection fraction.  - Neurology will be seeing her.  We will be part of her further coordination of care.  - Starting her on low-dose carvedilol 3.125 mg twice a day would be reasonable. Also consider changing from HCTZ to spironolactone 12.5 mg once a day to follow guidelines supportive therapy. However it may be reasonable at this point to allow for permissive hypertension in the setting of acute stroke.  - Statin.  We will follow.  Donato Schultz, MD

## 2017-06-20 NOTE — Therapy (Signed)
Occupational Therapy Treatment/ Discharge Patient Details Name: Elizabeth Bowman MRN: [id number] DOB: 03/29/1951 Today's Date: 06/20/2017    History of present illness pt is a 66 y/o female wit pmh significant for DM2, HTN and dyslipidemia.  pt noted ataxia, HA and dysarthria approx 12 hours prior to admission.  Pt came to ED for continued L sided HA.  MRI showed L MCA Infarct in insular area.   OT comments  Pt currently functioning at the supervision level for tub shower transfers. Pt agreeable to only shower at home when family is able to supervise and help her safely get in and out of the shower initially. Pt educated on fall risk preventions and is agreeable to remove throw rugs from house until balance and functional mobility improves. Pt educated on fine motor and bilateral coordination exercises to increase functional use of her hands. HEP provided. Pt able to d/c home when medically stable. OT signing off.     Follow Up Recommendations  No OT follow up;Supervision - Intermittent    Equipment Recommendations  3 in 1 bedside commode    Recommendations for Other Services      Precautions / Restrictions Precautions Precautions: Fall       Mobility Bed Mobility Overal bed mobility: Modified Independent                Transfers Overall transfer level: Needs assistance               General transfer comment: Pt requires supervision to safely perform tub-shower transfers upon initial arrival home. Pt agreeable to use 3 in 1 to sit on while in the shower due to safety concerns.      Balance Overall balance assessment: Needs assistance (Pt presents with decreased balance from 68/20/18. Pt agreeable to supervision initially upon d/c due to safety) Sitting-balance support: Feet supported Sitting balance-Leahy Scale: Good     Standing balance support: No upper extremity supported Standing balance-Leahy Scale: Fair Standing balance comment: Pt used countertop for support  when standing on one foot.                            ADL either performed or assessed with clinical judgement   ADL                                         General ADL Comments: Pt requires supervision to perform shower transfer. Pt unsteady on feet when lifting one foot of the ground and relies on countertop to maintain balance.      Vision       Perception     Praxis      Cognition Arousal/Alertness: Awake/alert Behavior During Therapy: WFL for tasks assessed/performed Overall Cognitive Status: Within Functional Limits for tasks assessed                                 General Comments: Good awareness of safety.        Exercises     Shoulder Instructions       General Comments Pt educated on fine motor coordination skills and therapist provided HEP. Pt educated on need for supervision with showering and medication management initially for safety. Pt educated on methods to reduce fall risk. Pt agreeable to remove throw rugs until balance has  improved.     Pertinent Vitals/ Pain       Pain Assessment: No/denies pain  Home Living                                          Prior Functioning/Environment              Frequency  Min 2X/week        Progress Toward Goals  OT Goals(current goals can now be found in the care plan section)  Progress towards OT goals: Progressing toward goals  Acute Rehab OT Goals Patient Stated Goal:  to be independent OT Goal Formulation: With patient Time For Goal Achievement: 07/03/17 Potential to Achieve Goals: Good  Plan Discharge plan remains appropriate    Co-evaluation                 AM-PAC PT "6 Clicks" Daily Activity     Outcome Measure   Help from another person eating meals?: None Help from another person taking care of personal grooming?: None Help from another person toileting, which includes using toliet, bedpan, or urinal?: None Help  from another person bathing (including washing, rinsing, drying)?: None Help from another person to put on and taking off regular upper body clothing?: None Help from another person to put on and taking off regular lower body clothing?: None 6 Click Score: 24    End of Session    OT Visit Diagnosis: Unsteadiness on feet (R26.81);Muscle weakness (generalized) (M62.81)   Activity Tolerance Patient tolerated treatment well   Patient Left in bed;with family/visitor present   Nurse Communication Other (comment) (Need for 3 in 1 chair before discharge home. )        Time: 1610-9604 OT Time Calculation (min): 14 min  Charges:    Cammy Copa, OTS [phone]   Cammy Copa 06/20/2017, 11:24 AM

## 2017-06-20 NOTE — Progress Notes (Signed)
STROKE TEAM PROGRESS NOTE   HISTORY OF PRESENT ILLNESS (per record) Elizabeth Bowman is an 66 y.o. female with a past medical history significant for diabetes and hypertension who presented to [facility] ED with complaints of AMS, confusion and aphasia. Her husband stated that she woke around 0930 this morning without any deficit. They ate breakfast in bed, and when she got out of bed to bring the plates to the kitchen, her husband said that she stumbled and fell against the wall. She continued to the kitchen and when she returned, she was slurring her words and complaining about a terrible headache. Mr. Dearcos suggested he bring her to the hospital, but she initially refused medical care. She got back into bed and only spoke a few, nearly incomprehensible words to him before going back to sleep.   On initial exam by the neurohospitalist, Elizabeth Bowman was alert, oriented and without speech deficit. She complained of a headache, and nausea, butenied dizziness, blurry vision, photophobia and phonophobia, pain.  Pertinent Imaging: CTA head/neck 06/17/17: Severe stenosis LEFT M2 segment without emergent LVO. Atherosclerosis with moderate stenosis RIGHT cavernous ICA.   Patient was not administered IV t-PA secondary to arriving outside of the treatment window. She was admitted to General Neurology for further evaluation and treatment.   SUBJECTIVE (INTERVAL HISTORY) Her family is at the bedside.  The patient is awake, alert, and follows all commands appropriately.  Echo shows decreased ejection fraction of 30%. Cardiology consult and TEE is pending OBJECTIVE Temp:  [98 F (36.7 C)-98.2 F (36.8 C)] 98 F (36.7 C) (08/21 0558) Pulse Rate:  [58-64] 60 (08/21 1610) Cardiac Rhythm: Sinus bradycardia;Heart block (08/21 0700) Resp:  [16-18] 16 (08/21 0558) BP: (116-155)/(59-74) 116/62 (08/21 0812) SpO2:  [97 %-100 %] 97 % (08/21 0558)  CBC:   Recent Labs Lab 06/17/17 2157 06/20/17 0600  WBC 6.1 7.2   NEUTROABS 3.0 3.8  HGB 11.6* 11.9*  HCT 34.4* 35.4*  MCV 97.7 94.1  PLT 253 262    Basic Metabolic Panel:   Recent Labs Lab 06/17/17 2157 06/20/17 0600  NA 139 139  K 3.2* 3.2*  CL 103 104  CO2 25 27  GLUCOSE 136* 100*  BUN 13 17  CREATININE 0.62 0.80  CALCIUM 9.4 9.2  MG  --  2.0    Lipid Panel:     Component Value Date/Time   CHOL 180 06/19/2017 0310   TRIG 82 06/19/2017 0310   HDL 48 06/19/2017 0310   CHOLHDL 3.8 06/19/2017 0310   VLDL 16 06/19/2017 0310   LDLCALC 116 (H) 06/19/2017 0310   HgbA1c:  Lab Results  Component Value Date   HGBA1C 6.1 (H) 06/18/2017   Urine Drug Screen:     Component Value Date/Time   LABOPIA NONE DETECTED 06/17/2017 2325   COCAINSCRNUR NONE DETECTED 06/17/2017 2325   LABBENZ NONE DETECTED 06/17/2017 2325   AMPHETMU NONE DETECTED 06/17/2017 2325   THCU NONE DETECTED 06/17/2017 2325   LABBARB NONE DETECTED 06/17/2017 2325    Alcohol Level     Component Value Date/Time   ETH <5 06/17/2017 2157    IMAGING  CT HEAD 06/17/2017 1. Small LEFT temporal lobe acute infarct versus beam hardening artifact. 2. Otherwise negative noncontrast CT HEAD for age.  Ct Angio Head W Or Wo Contrast 06/17/2017 CTA HEAD: 1. Severe stenosis LEFT M2 segment without emergent large vessel occlusion. 2. 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 3. Atherosclerosis with moderate stenosis RIGHT cavernous internal carotid artery.  Dg Chest 2 View 06/17/2017 IMPRESSION: Chronic cardiomegaly.  Lungs remain grossly clear.   Ct Angio Neck W And/or Wo Contrast 06/17/2017 CTA NECK: 1. Atherosclerosis without hemodynamically significant stenosis or acute vascular process. 2. Degenerative cervical spine resulting in moderate canal stenosis C5-6. Severe C3-4 and C5-6 neural foraminal narrowing.   Mr Brain Wo Contrast 06/19/2017 IMPRESSION: Small acute left MCA infarct involving the insula.  TTE 06/19/2017 Study Conclusions - Left  ventricle: The cavity size was mildly dilated. Wall thickness was increased in a pattern of mild LVH. Systolic function was moderately to severely reduced. The estimated ejection fraction was in the range of 30% to 35%. Diffuse hypokinesis. Doppler parameters are consistent with abnormal left ventricular relaxation (grade 1 diastolic dysfunction). - Mitral valve: Calcified annulus. Impressions: - Moderate to severe global reduction in LV systolic function; mild LVH; mild diastolic dysfunction; mild LVE.  Carotid Dopplers no significant hemodynamic stenosis at either carotid bifurcation  PHYSICAL EXAM Pleasant middle aged lady not in distress. . Afebrile. Head is nontraumatic. Neck is supple without bruit.    Cardiac exam no murmur or gallop. Lungs are clear to auscultation. Distal pulses are well felt. Neurological Exam ;  Awake  Alert oriented x 3. Normal speech and language.No aphasia or dysarthria. eye movements full without nystagmus.fundi were not visualized. Vision acuity and fields appear normal. Hearing is normal. Palatal movements are normal. Face symmetric. Tongue midline. Normal strength, tone, reflexes and coordination. Normal sensation. Gait deferred.  ASSESSMENT/PLAN [name] is a 66 y.o. female with history of diabetes and hypertension who presented to [facility] ED with complaints of AMS, confusion and aphasia. She did not receive IV t-PA due to arriving outside of the treatment window.   Stroke:  Small acute left MCA infarct involving the insula, likely embolic, in the setting of Severe stenosis LEFT M2 segment without emergent LVO and atherosclerosis with moderate stenosis RIGHT cavernous ICA.  Resultant  No deficits  CT head: no acute stroke.  Small LEFT temporal lobe acute infarct versus beam hardening artifact.  MRI head: Small acute left MCA infarct involving the insula  MRA head: not ordered  CTA head/neck: Severe L M2 stenosis without emergent LVO, bilateral  paraophthalmic wide necked 4 mm aneurysms,  moderate R ICA stenosis   2D Echo: EF 30-35% with moderately to severely systolic function Carotid dopplers no significant stenosis  LDL 116  HgbA1c 6.1  SCDs for VTE prophylaxis Diet heart healthy/carb modified Room service appropriate? Yes; Fluid consistency: Thin Diet NPO time specified Except for: Sips with Meds  No antithrombotic prior to admission, now on aspirin 325 mg daily and clopidogrel 75 mg daily  Patient counseled to be compliant with her antithrombotic medications  Ongoing aggressive stroke risk factor management  Therapy recommendations: none  Disposition:  pending  Hypertension  Stable  Permissive hypertension (OK if < 220/120) but gradually normalize in 5-7 days  Long-term BP goal normotensive  Hyperlipidemia  Home meds:  Atorvastatin 40 mg PO daily  LDL 116, goal < 70  Increase atorvastatin dose to 80 mg PO daily  Continue new statin dose at discharge  Diabetes  HgbA1c 6.1, goal < 7.0  Controlled  Other Stroke Risk Factors  Advanced age  ETOH use, advised to drink no more than 1 drink(s) a day  Systolic heart failure  Other Active Problems  None  Hospital day # 2  I have personally examined this patient, reviewed notes, independently viewed imaging studies, participated in medical decision making and  plan of care.ROS completed by me personally and pertinent positives fully documented  I have made any additions or clarifications directly to the above note. She presented with transient aphasia due to embolic left MCA branch infarct etiology to be due to mind. Continue aspirin and Plavix and she may need TEE and prolonged cardiac monitoring. Await cardiology consult. Greater than 50% time during this 25 minute visit was spent on counseling and coordination of care about her cryptogenic stroke, discussion about evaluation and treatment plan and answering questions. Discussed with Dr. Vivien Presto, MD Medical Director [facility] Pager: [phone] 06/20/2017 1:17 PM   To contact Stroke Continuity provider, please refer to [email or web address]. After hours, contact General Neurology

## 2017-06-20 NOTE — Progress Notes (Signed)
**  Preliminary report by tech**  Carotid artery duplex complete. Findings are consistent with a 1-39 percent stenosis involving the right internal carotid artery and the left internal carotid artery. The vertebral arteries demonstrate antegrade flow.  06/20/17 1:11 PM Olen Cordial RVT

## 2017-06-20 NOTE — Progress Notes (Signed)
Patient ID: Elizabeth Bowman, female   DOB: June 19, 1951, 66 y.o.   MRN: [id number]  PROGRESS NOTE    Maggy Wyble  WUJ:[id number] DOB: [birth year] DOA: 06/17/2017 PCP: Patient, No Pcp Per   Brief Narrative:  66 year old female with history of diabetes mellitus, hypertension and dyslipidemia presented with ataxia, headache and dysarthria. She was found to have acute stroke. Neurology was consulted.   Assessment & Plan:   Principal Problem:   Stroke [facility]) Active Problems:   Diabetes mellitus type 2 in nonobese (HCC)   HLD (hyperlipidemia)   Aphasia   Acute left MCA infarct likely embolic in the setting of severe stenosis left M2 segment  - Continue aspirin, statin, Plavix - Neurology follow-up appreciated. Neurology is recommending TEE - TTE shows ejection fraction of 30-35% - Cardiology consulted for low EF and TEE  - follow carotid ultrasound - patient is tolerating diet. PT/OT has already evaluated the patient   New diagnosis of cardiomyopathy with ejection fraction of 30-35% - Called cardiology for consult - Continue aspirin and Plavix and statin  Diabetes mellitus type 2 in nonobese  -Hemoglobin A1c 6.1 -Hold metformin -SSI  HLD (hyperlipidemia) -Continue Lipitor  Hypertension -monitor blood pressure. Continue lisinopril and hydrochlorothiazide  Hypokalemia - Replace potassium. Repeat a.m. labs   DVT prophylaxis:Lovenox Code Status:Full Family Communication:Spoke todaughter present at bedside Disposition Plan:Home in 1-2 days Consultants:Neurology;  cardiology  Procedures: Echo on 06/19/2017  Study Conclusions  - Left ventricle: The cavity size was mildly dilated. Wall   thickness was increased in a pattern of mild LVH. Systolic   function was moderately to severely reduced. The estimated   ejection fraction was in the range of 30% to 35%. Diffuse   hypokinesis. Doppler parameters are consistent with abnormal left   ventricular relaxation  (grade 1 diastolic dysfunction). - Mitral valve: Calcified annulus.  Impressions:  - Moderate to severe global reduction in LV systolic function; mild   LVH; mild diastolic dysfunction; mild LVE.  Antimicrobials: None  Subjective: Patient seen and examined at bedside. She denies any overnight fever, nausea or vomiting. She feels her speech is improving  Objective: Vitals:   06/19/17 2105 06/19/17 2107 06/20/17 0558 06/20/17 0812  BP: (!) 155/74 (!) 152/66 (!) 128/59 116/62  Pulse: 64 62 (!) 58 60  Resp: 17  16   Temp: 98 F (36.7 C)  98 F (36.7 C)   TempSrc: Oral  Oral   SpO2: 99% 98% 97%   Weight:      Height:        Intake/Output Summary (Last 24 hours) at 06/20/17 1102 Last data filed at 06/20/17 0210  Gross per 24 hour  Intake                0 ml  Output             1250 ml  Net            -1250 ml   Filed Weights   06/17/17 2133 06/18/17 1547  Weight: 72.6 kg (160 lb) 76 kg (167 lb 9.6 oz)    Examination:  General exam: Appears calm and comfortable  Respiratory system: Bilateral decreased breath sound at bases Cardiovascular system: S1 & S2 heard, . Rate Controlled Gastrointestinal system: Abdomen is nondistended, soft and nontender. Normal bowel sounds heard. Central nervous system: Alert and oriented. No focal neurological deficits. Moving extremities. Speech is still slightly slurred Extremities: No cyanosis, clubbing, edema      Data Reviewed: I have personally  reviewed following labs and imaging studies  CBC:  Recent Labs Lab 06/17/17 2157 06/20/17 0600  WBC 6.1 7.2  NEUTROABS 3.0 3.8  HGB 11.6* 11.9*  HCT 34.4* 35.4*  MCV 97.7 94.1  PLT 253 262   Basic Metabolic Panel:  Recent Labs Lab 06/17/17 2157 06/20/17 0600  NA 139 139  K 3.2* 3.2*  CL 103 104  CO2 25 27  GLUCOSE 136* 100*  BUN 13 17  CREATININE 0.62 0.80  CALCIUM 9.4 9.2  MG  --  2.0   GFR: Estimated Creatinine Clearance: 71.6 mL/min (by C-G formula based on SCr  of 0.8 mg/dL). Liver Function Tests: No results for input(s): AST, ALT, ALKPHOS, BILITOT, PROT, ALBUMIN in the last 168 hours. No results for input(s): LIPASE, AMYLASE in the last 168 hours. No results for input(s): AMMONIA in the last 168 hours. Coagulation Profile:  Recent Labs Lab 06/17/17 2157  INR 1.06   Cardiac Enzymes:  Recent Labs Lab 06/17/17 2157  TROPONINI <0.03   BNP (last 3 results) No results for input(s): PROBNP in the last 8760 hours. HbA1C:  Recent Labs  06/18/17 1754  HGBA1C 6.1*   CBG:  Recent Labs Lab 06/19/17 0746 06/19/17 1231 06/19/17 1657 06/19/17 2112 06/20/17 0754  GLUCAP 128* 91 140* 132* 96   Lipid Profile:  Recent Labs  06/19/17 0310  CHOL 180  HDL 48  LDLCALC 116*  TRIG 82  CHOLHDL 3.8   Thyroid Function Tests: No results for input(s): TSH, T4TOTAL, FREET4, T3FREE, THYROIDAB in the last 72 hours. Anemia Panel: No results for input(s): VITAMINB12, FOLATE, FERRITIN, TIBC, IRON, RETICCTPCT in the last 72 hours. Sepsis Labs: No results for input(s): PROCALCITON, LATICACIDVEN in the last 168 hours.  No results found for this or any previous visit (from the past 240 hour(s)).       Radiology Studies: Mr Brain Wo Contrast  Result Date: 06/19/2017 CLINICAL DATA:  Headache, slurred speech, and ataxia. EXAM: MRI HEAD WITHOUT CONTRAST TECHNIQUE: Multiplanar, multiecho pulse sequences of the brain and surrounding structures were obtained without intravenous contrast. COMPARISON:  Head CT/CTA 06/17/2017 FINDINGS: Brain: There is a small acute left MCA territory infarct predominantly involving the posterior insula with a punctate infarct in the left corona radiata. No intracranial hemorrhage, mass, midline shift, or extra-axial fluid collection is identified. The ventricles and sulci are normal. No significant chronic white matter disease is seen for age. Vascular: Major intracranial vascular flow voids are preserved. Abnormal FLAIR  signal is present in multiple distal left MCA inferior division branches likely reflecting reduced flow due to the high-grade proximal stenosis seen on CTA. Skull and upper cervical spine: Unremarkable bone marrow signal. Sinuses/Orbits: Unremarkable orbits. Mild right maxillary sinus mucosal thickening. Clear mastoid air cells. Other: None. IMPRESSION: Small acute left MCA infarct involving the insula. Electronically Signed   By: Sebastian Ache M.D.   On: 06/19/2017 10:16        Scheduled Meds: . aspirin  300 mg Rectal Daily   Or  . aspirin  325 mg Oral Daily  . atorvastatin  80 mg Oral q1800  . clopidogrel  75 mg Oral Daily  . enoxaparin (LOVENOX) injection  40 mg Subcutaneous Q24H  . lisinopril  20 mg Oral Daily   And  . hydrochlorothiazide  12.5 mg Oral Daily  . insulin aspart  0-5 Units Subcutaneous QHS  . insulin aspart  0-9 Units Subcutaneous TID WC  . pantoprazole  40 mg Oral Daily  . potassium chloride  40 mEq Oral BID   Continuous Infusions:   LOS: 2 days        Glade Lloyd, MD [facility] Pager [phone]  If 7PM-7AM, please contact night-coverage [email or web address] Password [email or web address] 06/20/2017, 11:02 AM

## 2017-06-21 ENCOUNTER — Inpatient Hospital Stay (HOSPITAL_COMMUNITY): Payer: Medicare Other

## 2017-06-21 ENCOUNTER — Encounter (HOSPITAL_COMMUNITY)
Admission: EM | Disposition: A | Discharge status: Home Health | Payer: Self-pay | Source: Home / Self Care | Attending: Internal Medicine

## 2017-06-21 ENCOUNTER — Encounter (HOSPITAL_COMMUNITY): Payer: Self-pay | Admitting: *Deleted

## 2017-06-21 DIAGNOSIS — I638 Other cerebral infarction: Secondary | ICD-10-CM

## 2017-06-21 DIAGNOSIS — I340 Nonrheumatic mitral (valve) insufficiency: Secondary | ICD-10-CM

## 2017-06-21 DIAGNOSIS — R410 Disorientation, unspecified: Secondary | ICD-10-CM

## 2017-06-21 HISTORY — PX: TEE WITHOUT CARDIOVERSION: SHX5443

## 2017-06-21 HISTORY — PX: LOOP RECORDER INSERTION: EP1214

## 2017-06-21 LAB — CBC WITH DIFFERENTIAL/PLATELET
BASOS ABS: 0 10*3/uL (ref 0.0–0.1)
BASOS PCT: 1 %
EOS ABS: 0.1 10*3/uL (ref 0.0–0.7)
Eosinophils Relative: 1 %
HCT: 35.4 % — ABNORMAL LOW (ref 36.0–46.0)
HEMOGLOBIN: 11.6 g/dL — AB (ref 12.0–15.0)
Lymphocytes Relative: 46 %
Lymphs Abs: 2.7 10*3/uL (ref 0.7–4.0)
MCH: 31.5 pg (ref 26.0–34.0)
MCHC: 32.8 g/dL (ref 30.0–36.0)
MCV: 96.2 fL (ref 78.0–100.0)
MONOS PCT: 7 %
Monocytes Absolute: 0.4 10*3/uL (ref 0.1–1.0)
NEUTROS ABS: 2.6 10*3/uL (ref 1.7–7.7)
NEUTROS PCT: 45 %
Platelets: 265 10*3/uL (ref 150–400)
RBC: 3.68 MIL/uL — AB (ref 3.87–5.11)
RDW: 12.6 % (ref 11.5–15.5)
WBC: 5.7 10*3/uL (ref 4.0–10.5)

## 2017-06-21 LAB — GLUCOSE, CAPILLARY
GLUCOSE-CAPILLARY: 109 mg/dL — AB (ref 65–99)
Glucose-Capillary: 95 mg/dL (ref 65–99)

## 2017-06-21 LAB — BASIC METABOLIC PANEL
ANION GAP: 7 (ref 5–15)
BUN: 17 mg/dL (ref 6–20)
CHLORIDE: 106 mmol/L (ref 101–111)
CO2: 26 mmol/L (ref 22–32)
CREATININE: 0.94 mg/dL (ref 0.44–1.00)
Calcium: 9.1 mg/dL (ref 8.9–10.3)
GFR calc non Af Amer: >60 mL/min (ref >60)
Glucose, Bld: 107 mg/dL — ABNORMAL HIGH (ref 65–99)
Potassium: 4.4 mmol/L (ref 3.5–5.1)
SODIUM: 139 mmol/L (ref 135–145)

## 2017-06-21 LAB — MAGNESIUM: MAGNESIUM: 2 mg/dL (ref 1.7–2.4)

## 2017-06-21 SURGERY — LOOP RECORDER INSERTION

## 2017-06-21 SURGERY — ECHOCARDIOGRAM, TRANSESOPHAGEAL
Anesthesia: Moderate Sedation

## 2017-06-21 MED ORDER — LIDOCAINE-EPINEPHRINE 1 %-1:100000 IJ SOLN
INTRAMUSCULAR | Status: DC | PRN
  Administered 2017-06-21: 30 mL

## 2017-06-21 MED ORDER — PERFLUTREN LIPID MICROSPHERE
INTRAVENOUS | Status: AC
  Filled 2017-06-21: qty 10

## 2017-06-21 MED ORDER — FENTANYL CITRATE (PF) 100 MCG/2ML IJ SOLN
INTRAMUSCULAR | Status: DC | PRN
  Administered 2017-06-21 (×2): 25 ug via INTRAVENOUS

## 2017-06-21 MED ORDER — CARVEDILOL 3.125 MG PO TABS: 3.1250 mg | ORAL_TABLET | Freq: Two times a day (BID) | ORAL | 0 refills | Status: DC

## 2017-06-21 MED ORDER — MIDAZOLAM HCL 5 MG/ML IJ SOLN
INTRAMUSCULAR | Status: AC
  Filled 2017-06-21: qty 2

## 2017-06-21 MED ORDER — MIDAZOLAM HCL 10 MG/2ML IJ SOLN
INTRAMUSCULAR | Status: DC | PRN
  Administered 2017-06-21: 2 mg via INTRAVENOUS
  Administered 2017-06-21: 1 mg via INTRAVENOUS

## 2017-06-21 MED ORDER — ASPIRIN 325 MG PO TABS: 325.0000 mg | ORAL_TABLET | Freq: Every day | ORAL | 0 refills | Status: DC

## 2017-06-21 MED ORDER — CLOPIDOGREL BISULFATE 75 MG PO TABS: 75.0000 mg | ORAL_TABLET | Freq: Every day | ORAL | 0 refills | Status: DC

## 2017-06-21 MED ORDER — ATORVASTATIN CALCIUM 80 MG PO TABS: 80.0000 mg | ORAL_TABLET | Freq: Every day | ORAL | 0 refills | Status: DC

## 2017-06-21 MED ORDER — FENTANYL CITRATE (PF) 100 MCG/2ML IJ SOLN
INTRAMUSCULAR | Status: AC
Start: 2017-06-21 — End: 2017-06-21
  Filled 2017-06-21: qty 2

## 2017-06-21 MED ORDER — BUTAMBEN-TETRACAINE-BENZOCAINE 2-2-14 % EX AERO
INHALATION_SPRAY | CUTANEOUS | Status: DC | PRN
  Administered 2017-06-21: 2 via TOPICAL

## 2017-06-21 MED ORDER — PERFLUTREN LIPID MICROSPHERE
INTRAVENOUS | Status: DC | PRN
  Administered 2017-06-21: 2 mL via INTRAVENOUS

## 2017-06-21 SURGICAL SUPPLY — 2 items
LOOP REVEAL LINQSYS (Prosthesis & Implant Heart) ×2 IMPLANT
PACK LOOP INSERTION (CUSTOM PROCEDURE TRAY) ×2 IMPLANT

## 2017-06-21 NOTE — Care Management Note (Addendum)
Case Management Note  Patient Details  Name: Yairi Sabas MRN: [id number] Date of Birth: [birth year]  Subjective/Objective:      Acute left MCA infarct, DM, HTN              Action/Plan: Discharge Planning: Spoke to pt and requesting 3n1 bedside commode for home. Contacted [facility] DME rep for DME to be delivered prior to dc. Pt lives at home with husband. Dtr also able to assist as needed. Pt requested dc summary be faxed to PCP.  PCP Ardyth Gal MD   Expected Discharge Date:  06/21/17               Expected Discharge Plan:  Home/Self Care  In-House Referral:  NA  Discharge planning Services  CM Consult  Post Acute Care Choice:  NA Choice offered to:  NA  DME Arranged:  3-N-1 DME Agency:  [facility]  HH Arranged:  NA HH Agency:  NA  Status of Service:  Completed, signed off  If discussed at Long Length of Stay Meetings, dates discussed:    Additional Comments:  Elliot Cousin, RN 06/21/2017, 4:17 PM

## 2017-06-21 NOTE — Discharge Summary (Signed)
Physician Discharge Summary  Elizabeth Bowman ZOX:[id number] DOB: May 13, 1951 DOA: 06/17/2017  PCP: Patient, No Pcp Per  Admit date: 06/17/2017 Discharge date: 06/21/2017  Time spent: 45 minutes  Recommendations for Outpatient Follow-up:  Patient will be discharged to home.  Patient will need to follow up with primary care provider within one week of discharge. Follow up with neurology, Dr. Pearlean Brownie in 6 weeks. Follow up with cardiology, Dr. Anne Fu. Patient should continue medications as prescribed.  Patient should follow a heart healthy/carb modified diet.   Discharge Diagnoses:  Acute left MCA infarct New onset Cardiomyopathy Diabetes mellitus, type II Hyperlipidemia Essential hypertension Hypokalemia  Discharge Condition: Stable  Diet recommendation: heart healthy/carb modified  Filed Weights   06/17/17 2133 06/18/17 1547 06/21/17 1039  Weight: 72.6 kg (160 lb) 76 kg (167 lb 9.6 oz) 74.8 kg (165 lb)    History of present illness:  On 06/18/2017 by Ms. Junious Silk NP Raymondo Band is a 66 y.o. female with medical history significant for DM 2 on metformin, hypertension and dyslipidemia. Patient was in her usual state of health yesterday a.m. on 8/18. Around breakfast she was the last time seen normal. She was walking out of the kitchen area when she began staggering and with an ataxic gait. She steadied herself and seemed better and decided to lay back down. Upon awakening she reported a very severe headache and several minutes later developed severe dysarthria and slurring of speech. Husband denies that her mouth was "drawn" to one side or the other but stated her mouth was not "normal". She did not wish to come to the doctor so she did not present for treatment until ~12 hours later apparently because of persistent headache.  At [facility] her initial neurological exam was unremarkable and because of being outside of window for TPA code stroke was not initiated. EDP spoke with neurology who  recommended CT angiogram of head and neck. This revealed a small left temporal lobe acute infarct versus beam hardening artifact as well as severe stenosis of the left M2 segment without emergent large vessel occlusion. There were also bilateral paraophthalmic wide neck to 4 mm aneurysms. Radiologist recommended neuro interventional radiology consultation for nonemergent treatment options. Patient has returned to her baseline mentation and function. Her headache persists. Her blood pressure is slightly elevated. She has been transferred to [facility]. Neurology has been notified of arrival.  Hospital Course:  Acute left MCA infarct -Likely embolic -Neurology consulted and appreciated -CT head showed small left temporal lobe acute infarct versus beam hardening artifact -CTA showed severe stenosis left M2 segment without emergent large vessel occlusion -MRI brain showed small left acute MCA involving the insula -Echocardiogram EF of 30-35%, grade 1 diastolic dysfunction -TEE: EF of 3035%, diffuse hypokinesis, no evidence of thrombus or vegetation, no defect or patent foramen ovale identified -Carotid Doppler showed no significant multiple dynamic stenosis of either carotid bifurcation -Continue statin, Plavix, aspirin -PT OT consulted, patient has no further needs. OT recommended 3 in 1 -Cardiology recommended loop recorder, placed by Dr. Sharrell Ku today -Patient will need to follow-up with Dr. Pearlean Brownie in 6 weeks  New onset Cardiomyopathy -Cardiology consulted and appreciated -EF noted 30-35% -Continue aspirin, Plavix, statin -Cardiology would like to start Coreg  Diabetes mellitus, type II -Hemoglobin A1c 6 -Metformin held, but may resume upon discharge -Was placed on insulin sliding scale  Hyperlipidemia -Continue Lipitor  Essential hypertension -In the setting of acute stroke, allowing for permissive hypertension -Was placed on lisinopril -Cardiology would like  to start  Coreg  Hypokalemia -Resolved  Procedures: Echocardiogram TEE Loop recorder placement  Consultations: Cardiology Neurology  Discharge Exam: Vitals:   06/21/17 1215 06/21/17 1220  BP: 139/62   Pulse: (!) 58 (!) 55  Resp: 11 12  Temp:    SpO2:  97%   Patient feeling better today. Denies chest pain, respiratory, abdominal pain, nausea vomiting, diarrhea constipation.   General: Well developed, well nourished, NAD, appears stated age  HEENT: NCAT, mucous membranes moist.  Cardiovascular: S1 S2 auscultated, no rubs, murmurs or gallops. Regular rate and rhythm.  Respiratory: Clear to auscultation bilaterally with equal chest rise  Abdomen: Soft, nontender, nondistended, + bowel sounds  Extremities: warm dry without cyanosis clubbing or edema  Neuro: AAOx3, nonfocal  Psych: Normal affect and demeanor  Discharge Instructions Discharge Instructions    Discharge instructions    Complete by:  As directed    Patient will be discharged to home.  Patient will need to follow up with primary care provider within one week of discharge. Follow up with neurology, Dr. Pearlean Brownie in 6 weeks. Follow up with cardiology, Dr. Anne Fu. Patient should continue medications as prescribed.  Patient should follow a heart healthy/carb modified diet.     Current Discharge Medication List    START taking these medications   Details  aspirin 325 MG tablet Take 1 tablet (325 mg total) by mouth daily. Qty: 30 tablet, Refills: 0    carvedilol (COREG) 3.125 MG tablet Take 1 tablet (3.125 mg total) by mouth 2 (two) times daily with a meal. Qty: 60 tablet, Refills: 0    clopidogrel (PLAVIX) 75 MG tablet Take 1 tablet (75 mg total) by mouth daily. Qty: 30 tablet, Refills: 0      CONTINUE these medications which have CHANGED   Details  atorvastatin (LIPITOR) 80 MG tablet Take 1 tablet (80 mg total) by mouth daily at 6 PM. Qty: 30 tablet, Refills: 0      CONTINUE these medications which have NOT  CHANGED   Details  lisinopril-hydrochlorothiazide (PRINZIDE,ZESTORETIC) 20-12.5 MG tablet Take 1 tablet by mouth daily.    metFORMIN (GLUCOPHAGE) 500 MG tablet Take by mouth 2 (two) times daily with a meal.    pantoprazole (PROTONIX) 40 MG tablet Take 40 mg by mouth daily.       No Known Allergies Follow-up Information    Jake Bathe, MD. Schedule an appointment as soon as possible for a visit in 2 week(s).   Specialty:  Cardiology Why:  Hospital follow up Contact information: [address] [place] [address] [phone]        Micki Riley, MD. Schedule an appointment as soon as possible for a visit in 6 week(s).   Specialties:  Neurology, Radiology Why:  Hospital follow up Contact information: [address] [place] [address] [phone]        Primary care physician. Schedule an appointment as soon as possible for a visit in 1 week(s).   Why:  Hospital follow up           The results of significant diagnostics from this hospitalization (including imaging, microbiology, ancillary and laboratory) are listed below for reference.    Significant Diagnostic Studies: Ct Angio Head W Or Wo Contrast  Result Date: 06/17/2017 CLINICAL DATA:  Confusion and aphasia, last seen normal at 10 a.m. LEFT-sided headache. History of diabetes. EXAM: CT ANGIOGRAPHY HEAD AND NECK TECHNIQUE: Multidetector CT imaging of the head and neck was performed  using the standard protocol during bolus administration of intravenous contrast. Multiplanar CT image reconstructions and MIPs were obtained to evaluate the vascular anatomy. Carotid stenosis measurements (when applicable) are obtained utilizing NASCET criteria, using the distal internal carotid diameter as the denominator. CONTRAST:  100 cc Isovue 370 COMPARISON:  None. FINDINGS: CT HEAD FINDINGS BRAIN: No intraparenchymal hemorrhage, mass effect nor midline shift. The ventricles and sulci are normal  for age. Patchy supratentorial white matter hypodensities less than expected for patient's age, though non-specific are most compatible with chronic small vessel ischemic disease. Faint small hypodensity LEFT temporal lobe (axial 18/34). No abnormal extra-axial fluid collections. Basal cisterns are patent. VASCULAR: Moderate calcific atherosclerosis of the carotid siphons. SKULL: No skull fracture. Large tooth 13 periapical abscess. No significant scalp soft tissue swelling. SINUSES/ORBITS: Mild lobulated RIGHT maxillary sinus mucosal thickening without air-fluid levels. Mastoid air cells are well aerated. The included ocular globes and orbital contents are non-suspicious. OTHER: None. CTA NECK AORTIC ARCH: Normal appearance of the thoracic arch, normal branch pattern. Mild calcific atherosclerosis. The origins of the innominate, left Common carotid artery and subclavian artery are widely patent. RIGHT CAROTID SYSTEM: Common carotid artery is widely patent, mild calcific atherosclerosis. Normal appearance of the carotid bifurcation without hemodynamically significant stenosis by NASCET criteria. Moderate atherosclerosis proximal LEFT internal carotid artery without stenosis. LEFT CAROTID SYSTEM: Common carotid artery is widely patent, mild calcific atherosclerosis. Patent carotid carotid bifurcation without hemodynamically significant stenosis by NASCET criteria, moderate eccentric calcific atherosclerosis. Normal appearance of the included internal carotid artery. VERTEBRAL ARTERIES:Codominant vertebral artery's. Mild calcific atherosclerosis proximal bilateral vertebral artery's which are patent throughout the course. Mild extrinsic compression due to degenerative cervical spine. SKELETON: No acute osseous process though bone windows have not been submitted. Severe C5-6 degenerative disc, moderate at C3-4. Moderate to severe RIGHT C3-4 and severe LEFT greater than RIGHT C5-6 neural foraminal narrowing. Moderate  canal stenosis C5-6. OTHER NECK: Soft tissues of the neck are non-acute though, not tailored for evaluation. CTA HEAD ANTERIOR CIRCULATION: Patent cervical internal carotid arteries, petrous, cavernous and supra clinoid internal carotid arteries. Moderate stenosis RIGHT cavernous internal carotid artery. Wide necked 4 mm posteriorly directed aneurysm LEFT anterior genu (series 10, axial 245/349). Wide necked 4 mm laterally directed RIGHT paraophthalmic aneurysm. Widely patent anterior communicating artery. Patent anterior and middle cerebral arteries. Severe stenosis LEFT M2 segment, superior division. No large vessel occlusion, contrast extravasation. POSTERIOR CIRCULATION: Patent vertebral arteries, vertebrobasilar junction and basilar artery, as well as main branch vessels. Patent posterior cerebral arteries, mild luminal regularity. No large vessel occlusion, significant stenosis, contrast extravasation or aneurysm. VENOUS SINUSES: Major dural venous sinuses are patent though not tailored for evaluation on this angiographic examination. ANATOMIC VARIANTS: Aplastic RIGHT A1 segment, bilateral anterior cerebral arteries arise from LEFT A1-2 junction. DELAYED PHASE: No abnormal intracranial enhancement. MIP images reviewed. IMPRESSION: CT HEAD: 1. Small LEFT temporal lobe acute infarct versus beam hardening artifact. 2. Otherwise negative noncontrast CT HEAD for age. CTA NECK: 1. Atherosclerosis without hemodynamically significant stenosis or acute vascular process. 2. Degenerative cervical spine resulting in moderate canal stenosis C5-6. Severe C3-4 and C5-6 neural foraminal narrowing. CTA HEAD: 1. Severe stenosis LEFT M2 segment without emergent large vessel occlusion. 2. 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 3. Atherosclerosis with moderate stenosis RIGHT cavernous internal carotid artery. 4. Neuro-Interventional Radiology consultation is suggested to evaluate the appropriateness of potential  treatment. Non-emergent evaluation can be arranged by calling [phone] during usual hours. Emergency evaluation can be requested by paging  [phone]. Aortic Atherosclerosis (ICD10-I70.0). Electronically Signed   By: Awilda Metro M.D.   On: 06/17/2017 23:55   Dg Chest 2 View  Result Date: 06/17/2017 CLINICAL DATA:  Acute onset of confusion and aphasia. Left-sided headache. Initial encounter. EXAM: CHEST  2 VIEW COMPARISON:  None. FINDINGS: The lungs are well-aerated and clear. There is no evidence of focal opacification, pleural effusion or pneumothorax. The heart is borderline enlarged. No acute osseous abnormalities are seen. IMPRESSION: Chronic cardiomegaly.  Lungs remain grossly clear. Electronically Signed   By: Roanna Raider M.D.   On: 06/17/2017 23:41   Ct Angio Neck W And/or Wo Contrast  Result Date: 06/17/2017 CLINICAL DATA:  Confusion and aphasia, last seen normal at 10 a.m. LEFT-sided headache. History of diabetes. EXAM: CT ANGIOGRAPHY HEAD AND NECK TECHNIQUE: Multidetector CT imaging of the head and neck was performed using the standard protocol during bolus administration of intravenous contrast. Multiplanar CT image reconstructions and MIPs were obtained to evaluate the vascular anatomy. Carotid stenosis measurements (when applicable) are obtained utilizing NASCET criteria, using the distal internal carotid diameter as the denominator. CONTRAST:  100 cc Isovue 370 COMPARISON:  None. FINDINGS: CT HEAD FINDINGS BRAIN: No intraparenchymal hemorrhage, mass effect nor midline shift. The ventricles and sulci are normal for age. Patchy supratentorial white matter hypodensities less than expected for patient's age, though non-specific are most compatible with chronic small vessel ischemic disease. Faint small hypodensity LEFT temporal lobe (axial 18/34). No abnormal extra-axial fluid collections. Basal cisterns are patent. VASCULAR: Moderate calcific atherosclerosis of the carotid siphons.  SKULL: No skull fracture. Large tooth 13 periapical abscess. No significant scalp soft tissue swelling. SINUSES/ORBITS: Mild lobulated RIGHT maxillary sinus mucosal thickening without air-fluid levels. Mastoid air cells are well aerated. The included ocular globes and orbital contents are non-suspicious. OTHER: None. CTA NECK AORTIC ARCH: Normal appearance of the thoracic arch, normal branch pattern. Mild calcific atherosclerosis. The origins of the innominate, left Common carotid artery and subclavian artery are widely patent. RIGHT CAROTID SYSTEM: Common carotid artery is widely patent, mild calcific atherosclerosis. Normal appearance of the carotid bifurcation without hemodynamically significant stenosis by NASCET criteria. Moderate atherosclerosis proximal LEFT internal carotid artery without stenosis. LEFT CAROTID SYSTEM: Common carotid artery is widely patent, mild calcific atherosclerosis. Patent carotid carotid bifurcation without hemodynamically significant stenosis by NASCET criteria, moderate eccentric calcific atherosclerosis. Normal appearance of the included internal carotid artery. VERTEBRAL ARTERIES:Codominant vertebral artery's. Mild calcific atherosclerosis proximal bilateral vertebral artery's which are patent throughout the course. Mild extrinsic compression due to degenerative cervical spine. SKELETON: No acute osseous process though bone windows have not been submitted. Severe C5-6 degenerative disc, moderate at C3-4. Moderate to severe RIGHT C3-4 and severe LEFT greater than RIGHT C5-6 neural foraminal narrowing. Moderate canal stenosis C5-6. OTHER NECK: Soft tissues of the neck are non-acute though, not tailored for evaluation. CTA HEAD ANTERIOR CIRCULATION: Patent cervical internal carotid arteries, petrous, cavernous and supra clinoid internal carotid arteries. Moderate stenosis RIGHT cavernous internal carotid artery. Wide necked 4 mm posteriorly directed aneurysm LEFT anterior genu  (series 10, axial 245/349). Wide necked 4 mm laterally directed RIGHT paraophthalmic aneurysm. Widely patent anterior communicating artery. Patent anterior and middle cerebral arteries. Severe stenosis LEFT M2 segment, superior division. No large vessel occlusion, contrast extravasation. POSTERIOR CIRCULATION: Patent vertebral arteries, vertebrobasilar junction and basilar artery, as well as main branch vessels. Patent posterior cerebral arteries, mild luminal regularity. No large vessel occlusion, significant stenosis, contrast extravasation or aneurysm. VENOUS SINUSES: Major dural venous sinuses are patent  though not tailored for evaluation on this angiographic examination. ANATOMIC VARIANTS: Aplastic RIGHT A1 segment, bilateral anterior cerebral arteries arise from LEFT A1-2 junction. DELAYED PHASE: No abnormal intracranial enhancement. MIP images reviewed. IMPRESSION: CT HEAD: 1. Small LEFT temporal lobe acute infarct versus beam hardening artifact. 2. Otherwise negative noncontrast CT HEAD for age. CTA NECK: 1. Atherosclerosis without hemodynamically significant stenosis or acute vascular process. 2. Degenerative cervical spine resulting in moderate canal stenosis C5-6. Severe C3-4 and C5-6 neural foraminal narrowing. CTA HEAD: 1. Severe stenosis LEFT M2 segment without emergent large vessel occlusion. 2. 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 3. Atherosclerosis with moderate stenosis RIGHT cavernous internal carotid artery. 4. Neuro-Interventional Radiology consultation is suggested to evaluate the appropriateness of potential treatment. Non-emergent evaluation can be arranged by calling [phone] during usual hours. Emergency evaluation can be requested by paging [phone]. Aortic Atherosclerosis (ICD10-I70.0). Electronically Signed   By: Awilda Metro M.D.   On: 06/17/2017 23:55   Mr Brain Wo Contrast  Result Date: 06/19/2017 CLINICAL DATA:  Headache, slurred speech, and ataxia.  EXAM: MRI HEAD WITHOUT CONTRAST TECHNIQUE: Multiplanar, multiecho pulse sequences of the brain and surrounding structures were obtained without intravenous contrast. COMPARISON:  Head CT/CTA 06/17/2017 FINDINGS: Brain: There is a small acute left MCA territory infarct predominantly involving the posterior insula with a punctate infarct in the left corona radiata. No intracranial hemorrhage, mass, midline shift, or extra-axial fluid collection is identified. The ventricles and sulci are normal. No significant chronic white matter disease is seen for age. Vascular: Major intracranial vascular flow voids are preserved. Abnormal FLAIR signal is present in multiple distal left MCA inferior division branches likely reflecting reduced flow due to the high-grade proximal stenosis seen on CTA. Skull and upper cervical spine: Unremarkable bone marrow signal. Sinuses/Orbits: Unremarkable orbits. Mild right maxillary sinus mucosal thickening. Clear mastoid air cells. Other: None. IMPRESSION: Small acute left MCA infarct involving the insula. Electronically Signed   By: Sebastian Ache M.D.   On: 06/19/2017 10:16    Microbiology: No results found for this or any previous visit (from the past 240 hour(s)).   Labs: Basic Metabolic Panel:  Recent Labs Lab 06/17/17 2157 06/20/17 0600 06/21/17 0440  NA 139 139 139  K 3.2* 3.2* 4.4  CL 103 104 106  CO2 25 27 26   GLUCOSE 136* 100* 107*  BUN 13 17 17   CREATININE 0.62 0.80 0.94  CALCIUM 9.4 9.2 9.1  MG  --  2.0 2.0   Liver Function Tests: No results for input(s): AST, ALT, ALKPHOS, BILITOT, PROT, ALBUMIN in the last 168 hours. No results for input(s): LIPASE, AMYLASE in the last 168 hours. No results for input(s): AMMONIA in the last 168 hours. CBC:  Recent Labs Lab 06/17/17 2157 06/20/17 0600 06/21/17 0440  WBC 6.1 7.2 5.7  NEUTROABS 3.0 3.8 2.6  HGB 11.6* 11.9* 11.6*  HCT 34.4* 35.4* 35.4*  MCV 97.7 94.1 96.2  PLT 253 262 265   Cardiac  Enzymes:  Recent Labs Lab 06/17/17 2157  TROPONINI <0.03   BNP: BNP (last 3 results) No results for input(s): BNP in the last 8760 hours.  ProBNP (last 3 results) No results for input(s): PROBNP in the last 8760 hours.  CBG:  Recent Labs Lab 06/20/17 1210 06/20/17 1708 06/20/17 2131 06/21/17 0758 06/21/17 1244  GLUCAP 107* 160* 147* 109* 95       Signed:  Zonia Caplin  [facility] 06/21/2017, 4:07 PM

## 2017-06-21 NOTE — Progress Notes (Signed)
STROKE TEAM PROGRESS NOTE   HISTORY OF PRESENT ILLNESS (per record) Elizabeth Bowman is an 66 y.o. female with a past medical history significant for diabetes and hypertension who presented to [facility] ED with complaints of AMS, confusion and aphasia. Her husband stated that she woke around 0930 this morning without any deficit. They ate breakfast in bed, and when she got out of bed to bring the plates to the kitchen, her husband said that she stumbled and fell against the wall. She continued to the kitchen and when she returned, she was slurring her words and complaining about a terrible headache. Mr. Laning suggested he bring her to the hospital, but she initially refused medical care. She got back into bed and only spoke a few, nearly incomprehensible words to him before going back to sleep.   On initial exam by the neurohospitalist, Elizabeth Bowman was alert, oriented and without speech deficit. She complained of a headache, and nausea, butenied dizziness, blurry vision, photophobia and phonophobia, pain.  Pertinent Imaging: CTA head/neck 06/17/17: Severe stenosis LEFT M2 segment without emergent LVO. Atherosclerosis with moderate stenosis RIGHT cavernous ICA.   Patient was not administered IV t-PA secondary to arriving outside of the treatment window. She was admitted to General Neurology for further evaluation and treatment.   SUBJECTIVE (INTERVAL HISTORY) Her family is at the bedside.  The patient Has just finished TEE and was examined in the room. Dr. Delton See informs me that the TEE was normal and ejection fraction is diminished at 35%. She recommends outpatient cardiac follow-up and further evaluation OBJECTIVE Temp:  [97.7 F (36.5 C)-98.4 F (36.9 C)] 98 F (36.7 C) (08/22 1030) Pulse Rate:  [50-85] 55 (08/22 1220) Cardiac Rhythm: Sinus bradycardia (08/22 0700) Resp:  [11-23] 12 (08/22 1220) BP: (108-186)/(55-96) 139/62 (08/22 1215) SpO2:  [97 %-100 %] 97 % (08/22 1220) Weight:  [165 lb  (74.8 kg)] 165 lb (74.8 kg) (08/22 1039)  CBC:   Recent Labs Lab 06/20/17 0600 06/21/17 0440  WBC 7.2 5.7  NEUTROABS 3.8 2.6  HGB 11.9* 11.6*  HCT 35.4* 35.4*  MCV 94.1 96.2  PLT 262 265    Basic Metabolic Panel:   Recent Labs Lab 06/20/17 0600 06/21/17 0440  NA 139 139  K 3.2* 4.4  CL 104 106  CO2 27 26  GLUCOSE 100* 107*  BUN 17 17  CREATININE 0.80 0.94  CALCIUM 9.2 9.1  MG 2.0 2.0    Lipid Panel:     Component Value Date/Time   CHOL 180 06/19/2017 0310   TRIG 82 06/19/2017 0310   HDL 48 06/19/2017 0310   CHOLHDL 3.8 06/19/2017 0310   VLDL 16 06/19/2017 0310   LDLCALC 116 (H) 06/19/2017 0310   HgbA1c:  Lab Results  Component Value Date   HGBA1C 6.1 (H) 06/18/2017   Urine Drug Screen:     Component Value Date/Time   LABOPIA NONE DETECTED 06/17/2017 2325   COCAINSCRNUR NONE DETECTED 06/17/2017 2325   LABBENZ NONE DETECTED 06/17/2017 2325   AMPHETMU NONE DETECTED 06/17/2017 2325   THCU NONE DETECTED 06/17/2017 2325   LABBARB NONE DETECTED 06/17/2017 2325    Alcohol Level     Component Value Date/Time   ETH <5 06/17/2017 2157    IMAGING  CT HEAD 06/17/2017 1. Small LEFT temporal lobe acute infarct versus beam hardening artifact. 2. Otherwise negative noncontrast CT HEAD for age.  Ct Angio Head W Or Wo Contrast 06/17/2017 CTA HEAD: 1. Severe stenosis LEFT M2 segment without emergent large  vessel occlusion. 2. 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 3. Atherosclerosis with moderate stenosis RIGHT cavernous internal carotid artery.   Dg Chest 2 View 06/17/2017 IMPRESSION: Chronic cardiomegaly.  Lungs remain grossly clear.   Ct Angio Neck W And/or Wo Contrast 06/17/2017 CTA NECK: 1. Atherosclerosis without hemodynamically significant stenosis or acute vascular process. 2. Degenerative cervical spine resulting in moderate canal stenosis C5-6. Severe C3-4 and C5-6 neural foraminal narrowing.   Mr Brain Wo  Contrast 06/19/2017 IMPRESSION: Small acute left MCA infarct involving the insula.  TTE 06/19/2017 Study Conclusions - Left ventricle: The cavity size was mildly dilated. Wall thickness was increased in a pattern of mild LVH. Systolic function was moderately to severely reduced. The estimated ejection fraction was in the range of 30% to 35%. Diffuse hypokinesis. Doppler parameters are consistent with abnormal left ventricular relaxation (grade 1 diastolic dysfunction). - Mitral valve: Calcified annulus. Impressions: - Moderate to severe global reduction in LV systolic function; mild LVH; mild diastolic dysfunction; mild LVE.  Carotid Dopplers no significant hemodynamic stenosis at either carotid bifurcation  PHYSICAL EXAM Pleasant middle aged lady not in distress. . Afebrile. Head is nontraumatic. Neck is supple without bruit.    Cardiac exam no murmur or gallop. Lungs are clear to auscultation. Distal pulses are well felt. Neurological Exam ;  Exam is limited due to TEE sedation. Face symmetric. Tongue midline. Normal strength, tone, reflexes and coordination. Normal sensation. Gait deferred.  ASSESSMENT/PLAN Elizabeth Bowman is a 66 y.o. female with history of diabetes and hypertension who presented to [facility] ED with complaints of AMS, confusion and aphasia. She did not receive IV t-PA due to arriving outside of the treatment window.   Stroke:  Small acute left MCA infarct involving the insula, likely embolic, in the setting of Severe stenosis LEFT M2 segment without emergent LVO and atherosclerosis with moderate stenosis RIGHT cavernous ICA.  Resultant  No deficits  CT head: no acute stroke.  Small LEFT temporal lobe acute infarct versus beam hardening artifact.  MRI head: Small acute left MCA infarct involving the insula  MRA head: not ordered  CTA head/neck: Severe L M2 stenosis without emergent LVO, bilateral paraophthalmic wide necked 4 mm aneurysms,  moderate R ICA  stenosis   2D Echo: EF 30-35% with moderately to severely systolic function Carotid dopplers no significant stenosis  LDL 116  HgbA1c 6.1  SCDs for VTE prophylaxis Diet heart healthy/carb modified Room service appropriate? Yes; Fluid consistency: Thin  No antithrombotic prior to admission, now on aspirin 325 mg daily and clopidogrel 75 mg daily  Patient counseled to be compliant with her antithrombotic medications  Ongoing aggressive stroke risk factor management  Therapy recommendations: none  Disposition:  pending  Hypertension  Stable  Permissive hypertension (OK if < 220/120) but gradually normalize in 5-7 days  Long-term BP goal normotensive  Hyperlipidemia  Home meds:  Atorvastatin 40 mg PO daily  LDL 116, goal < 70  Increase atorvastatin dose to 80 mg PO daily  Continue new statin dose at discharge  Diabetes  HgbA1c 6.1, goal < 7.0  Controlled  Other Stroke Risk Factors  Advanced age  ETOH use, advised to drink no more than 1 drink(s) a day  Systolic heart failure  Other Active Problems  None  Hospital day # 3  I have personally examined this patient, reviewed notes, independently viewed imaging studies, participated in medical decision making and plan of care.ROS completed by me personally and pertinent positives fully documented  I  have made any additions or clarifications directly to the above note. She presented with transient aphasia due to embolic left MCA branch infarct etiology to be due to mind. Continue aspirin and Plavix and she will need loop recorder and prolonged cardiac monitoring. Outpatient cardiac workup following ejection fraction as per Dr. Delton See  t. Greater than 50% time during this 25 minute visit was spent on counseling and coordination of care about her cryptogenic stroke, discussion about evaluation and treatment plan and answering questions follow-up as an outpatient in stroke clinic in 6 weeks. Stroke team will sign off.  Kindly call for questions.. Discussed with Dr. Vivien Presto, MD Medical Director [facility] Pager: [phone] 06/21/2017 1:28 PM   To contact Stroke Continuity provider, please refer to [email or web address]. After hours, contact General Neurology

## 2017-06-21 NOTE — H&P (View-Only) (Signed)
 Progress Note  Patient Name: Elizabeth Bowman Date of Encounter: 06/21/2017  Primary Cardiologist: Skains  Subjective   No new complaints, no sob, no cp  Inpatient Medications    Scheduled Meds: . aspirin  300 mg Rectal Daily   Or  . aspirin  325 mg Oral Daily  . atorvastatin  80 mg Oral q1800  . clopidogrel  75 mg Oral Daily  . enoxaparin (LOVENOX) injection  40 mg Subcutaneous Q24H  . insulin aspart  0-5 Units Subcutaneous QHS  . insulin aspart  0-9 Units Subcutaneous TID WC  . lisinopril  20 mg Oral Daily  . pantoprazole  40 mg Oral Daily   Continuous Infusions:  PRN Meds: acetaminophen **OR** acetaminophen (TYLENOL) oral liquid 160 mg/5 mL **OR** acetaminophen, ondansetron (ZOFRAN) IV **OR** promethazine   Vital Signs    Vitals:   06/20/17 0812 06/20/17 1341 06/20/17 2132 06/21/17 0544  BP: 116/62 (!) 149/86 (!) 108/55 117/60  Pulse: 60 65 (!) 59 (!) 50  Resp:  16 17 18  Temp:  98.4 F (36.9 C) 98.4 F (36.9 C) 97.7 F (36.5 C)  TempSrc:  Oral Oral Oral  SpO2:  100% 98% 99%  Weight:      Height:        Intake/Output Summary (Last 24 hours) at 06/21/17 0845 Last data filed at 06/21/17 0806  Gross per 24 hour  Intake              100 ml  Output             1600 ml  Net            -1500 ml   Filed Weights   06/17/17 2133 06/18/17 1547  Weight: 160 lb (72.6 kg) 167 lb 9.6 oz (76 kg)    Telemetry    NSR - Personally Reviewed  ECG    NSR - Personally Reviewed  Physical Exam   GEN: No acute distress.   Neck: No JVD Cardiac: RRR, no murmurs, rubs, or gallops.  Respiratory: Clear to auscultation bilaterally. GI: Soft, nontender, non-distended  MS: No edema; No deformity. Neuro:  Nonfocal  Psych: Normal affect   Labs    Chemistry Recent Labs Lab 06/17/17 2157 06/20/17 0600 06/21/17 0440  NA 139 139 139  K 3.2* 3.2* 4.4  CL 103 104 106  CO2 25 27 26  GLUCOSE 136* 100* 107*  BUN 13 17 17  CREATININE 0.62 0.80 0.94  CALCIUM 9.4 9.2 9.1   GFRNONAA >60 >60 >60  GFRAA >60 >60 >60  ANIONGAP 11 8 7     Hematology Recent Labs Lab 06/17/17 2157 06/20/17 0600 06/21/17 0440  WBC 6.1 7.2 5.7  RBC 3.52* 3.76* 3.68*  HGB 11.6* 11.9* 11.6*  HCT 34.4* 35.4* 35.4*  MCV 97.7 94.1 96.2  MCH 33.0 31.6 31.5  MCHC 33.7 33.6 32.8  RDW 11.8 12.3 12.6  PLT 253 262 265    Cardiac Enzymes Recent Labs Lab 06/17/17 2157  TROPONINI <0.03   No results for input(s): TROPIPOC in the last 168 hours.   BNPNo results for input(s): BNP, PROBNP in the last 168 hours.   DDimer No results for input(s): DDIMER in the last 168 hours.   Radiology    Mr Brain Wo Contrast  Result Date: 06/19/2017 CLINICAL DATA:  Headache, slurred speech, and ataxia. EXAM: MRI HEAD WITHOUT CONTRAST TECHNIQUE: Multiplanar, multiecho pulse sequences of the brain and surrounding structures were obtained without intravenous contrast. COMPARISON:  Head   CT/CTA 06/17/2017 FINDINGS: Brain: There is a small acute left MCA territory infarct predominantly involving the posterior insula with a punctate infarct in the left corona radiata. No intracranial hemorrhage, mass, midline shift, or extra-axial fluid collection is identified. The ventricles and sulci are normal. No significant chronic white matter disease is seen for age. Vascular: Major intracranial vascular flow voids are preserved. Abnormal FLAIR signal is present in multiple distal left MCA inferior division branches likely reflecting reduced flow due to the high-grade proximal stenosis seen on CTA. Skull and upper cervical spine: Unremarkable bone marrow signal. Sinuses/Orbits: Unremarkable orbits. Mild right maxillary sinus mucosal thickening. Clear mastoid air cells. Other: None. IMPRESSION: Small acute left MCA infarct involving the insula. Electronically Signed   By: Allen  Grady M.D.   On: 06/19/2017 10:16    Cardiac Studies   EF 30%  Patient Profile         Elizabeth Bowman is a 66 y.o. female with a hx of  DM (A1C 6.1), HTN, 20-30 years of prior tobacco abuse, dyslipidemia who is being seen for the evaluation of newly recognized LV dysfunction at the request of Dr. Alekh.   Assessment & Plan    CVA/ dilated cardiomyopathy  - per neuro  - acute L MCA infarct  - neuro does not wish to use warfarin currently   - TEE today at 3pm  - Patient is hesitant about loop recorder. Could use 30 day event monitor if still hesitant.   - would start coreg 3.125 BID if ok with neuro (permissive HTN). On ACE-I  - will repeat ECHO in 3 months. If still low--> further ischemic testing.    Signed, Mark Skains, MD  06/21/2017, 8:45 AM    

## 2017-06-21 NOTE — Interval H&P Note (Signed)
History and Physical Interval Note:  06/21/2017 11:02 AM  Elizabeth Bowman  has presented today for surgery, with the diagnosis of STROKE  The various methods of treatment have been discussed with the patient and family. After consideration of risks, benefits and other options for treatment, the patient has consented to  Procedure(s): TRANSESOPHAGEAL ECHOCARDIOGRAM (TEE) (N/A) as a surgical intervention .  The patient's history has been reviewed, patient examined, no change in status, stable for surgery.  I have reviewed the patient's chart and labs.  Questions were answered to the patient's satisfaction.     [name]

## 2017-06-21 NOTE — CV Procedure (Signed)
     Transesophageal Echocardiogram Note  Kristalynn Bitner [id number] 1951-09-26  Procedure: Transesophageal Echocardiogram Indications: stroke  Procedure Details Consent: Obtained Time Out: Verified patient identification, verified procedure, site/side was marked, verified correct patient position, special equipment/implants available, Radiology Safety Procedures followed,  medications/allergies/relevent history reviewed, required imaging and test results available.  Performed  Medications: During this procedure the patient is administered a total of Versed 4 mg and Fentanyl 50 mcg to achieve and maintain moderate conscious sedation.  The patient's heart rate, blood pressure, and oxygen saturation are monitored continuously during the procedure. The period of conscious sedation is 30 minutes, of which I was present face-to-face 100% of this time.  - Left ventricle: The cavity size was mildly dilated. There was   mild concentric hypertrophy. Systolic function was moderately to   severely reduced. The estimated ejection fraction was in the   range of 30% to 35%. Diffuse hypokinesis. - Aortic valve: No evidence of vegetation. - Mitral valve: There was mild regurgitation. - Left atrium: No evidence of thrombus in the atrial cavity or   appendage. No evidence of thrombus in the atrial cavity or   appendage. No evidence of thrombus in the appendage. - Right atrium: The atrium was dilated. No evidence of thrombus in   the atrial cavity or appendage. - Atrial septum: No defect or patent foramen ovale was identified.   Echo contrast study showed no right-to-left atrial level shunt,   following an increase in RA pressure induced by provocative   maneuvers. - Pulmonic valve: No evidence of vegetation.  Impressions:  - No cardiac source of emboli was indentified. Complications: No apparent complications Patient did tolerate procedure well.  [name], MD, [facility] 06/21/2017, 12:17  PM

## 2017-06-21 NOTE — Consult Note (Signed)
ELECTROPHYSIOLOGY CONSULT NOTE  Patient ID: Elizabeth Bowman MRN: [id number], DOB/AGE: 06-Feb-1951   Admit date: 06/17/2017 Date of Consult: 06/21/2017  Primary Physician: Patient, No Pcp Per Primary Cardiologist: new [facility] Reason for Consultation: Cryptogenic stroke; recommendations regarding Implantable Loop Recorder  History of Present Illness EP has been asked to evaluate Elizabeth Bowman for placement of an implantable loop recorder to monitor for atrial fibrillation by Dr Pearlean Brownie.  The patient was admitted on 06/17/2017 with AMS, confusion, and aphasia.   she has undergone workup for stroke including echocardiogram and carotid dopplers.  The patient has been monitored on telemetry which has demonstrated sinus rhythm with no arrhythmias. TEE demonstrated no cause for stroke.  Echocardiogram this admission demonstrated EF 30-35%, mild LVH, grade 1 diastolic dysfunction.  Lab work is reviewed.  Prior to admission, the patient denies chest pain, shortness of breath, dizziness, palpitations, or syncope.    Past Medical History:  Diagnosis Date  . Complication of anesthesia   . Diabetes mellitus (HCC)   . Former tobacco use   . HTN (hypertension)   . Hyperlipidemia      Surgical History: History reviewed. No pertinent surgical history.   Prescriptions Prior to Admission  Medication Sig Dispense Refill Last Dose  . atorvastatin (LIPITOR) 40 MG tablet Take 40 mg by mouth daily.   06/18/2017 at Unknown time  . lisinopril-hydrochlorothiazide (PRINZIDE,ZESTORETIC) 20-12.5 MG tablet Take 1 tablet by mouth daily.   06/18/2017 at Unknown time  . metFORMIN (GLUCOPHAGE) 500 MG tablet Take by mouth 2 (two) times daily with a meal.   06/18/2017 at Unknown time  . pantoprazole (PROTONIX) 40 MG tablet Take 40 mg by mouth daily.   06/18/2017 at Unknown time    Inpatient Medications:  . aspirin  300 mg Rectal Daily   Or  . aspirin  325 mg Oral Daily  . atorvastatin  80 mg Oral q1800  . clopidogrel   75 mg Oral Daily  . enoxaparin (LOVENOX) injection  40 mg Subcutaneous Q24H  . insulin aspart  0-5 Units Subcutaneous QHS  . insulin aspart  0-9 Units Subcutaneous TID WC  . lisinopril  20 mg Oral Daily  . pantoprazole  40 mg Oral Daily    Allergies: No Known Allergies  Social History   Social History  . Marital status: Married    Spouse name: N/A  . Number of children: N/A  . Years of education: N/A   Occupational History  . Not on file.   Social History Main Topics  . Smoking status: Former Smoker    Quit date: 06/19/2007  . Smokeless tobacco: Former Neurosurgeon     Comment: Smoked for 20-30 years  . Alcohol use Yes     Comment: occasional drink a few times a month, not regularly  . Drug use: No  . Sexual activity: Yes    Partners: Female   Other Topics Concern  . Not on file   Social History Narrative  . No narrative on file     Family History  Problem Relation Age of Onset  . Cancer Father   . Cancer Brother   . CAD Neg Hx   . Heart attack Neg Hx   . Congestive Heart Failure Neg Hx       Review of Systems: All other systems reviewed and are otherwise negative except as noted above.  Physical Exam: Vitals:   06/21/17 1205 06/21/17 1210 06/21/17 1215 06/21/17 1220  BP: 128/66  139/62   Pulse:  60 (!) 55 (!) 58 (!) 55  Resp: 13 12 11 12   Temp:      TempSrc:      SpO2: 97% 97%  97%  Weight:      Height:        GEN- The patient is elderly appearing, alert and oriented x 3 today.   Head- normocephalic, atraumatic Eyes-  Sclera clear, conjunctiva pink Ears- hearing intact Oropharynx- clear Neck- supple Lungs- Clear to ausculation bilaterally, normal work of breathing Heart- Regular rate and rhythm  GI- soft, NT, ND, + BS Extremities- no clubbing, cyanosis, or edema MS- no significant deformity or atrophy Skin- no rash or lesion Psych- euthymic mood, full affect   Labs:   Lab Results  Component Value Date   WBC 5.7 06/21/2017   HGB 11.6 (L)  06/21/2017   HCT 35.4 (L) 06/21/2017   MCV 96.2 06/21/2017   PLT 265 06/21/2017    Recent Labs Lab 06/21/17 0440  NA 139  K 4.4  CL 106  CO2 26  BUN 17  CREATININE 0.94  CALCIUM 9.1  GLUCOSE 107*     Radiology/Studies: Ct Angio Head W Or Wo Contrast Result Date: 06/17/2017 CLINICAL DATA:  Confusion and aphasia, last seen normal at 10 a.m. LEFT-sided headache. History of diabetes. EXAM: CT ANGIOGRAPHY HEAD AND NECK TECHNIQUE: Multidetector CT imaging of the head and neck was performed using the standard protocol during bolus administration of intravenous contrast. Multiplanar CT image reconstructions and MIPs were obtained to evaluate the vascular anatomy. Carotid stenosis measurements (when applicable) are obtained utilizing NASCET criteria, using the distal internal carotid diameter as the denominator. CONTRAST:  100 cc Isovue 370 COMPARISON:  None. FINDINGS: CT HEAD FINDINGS BRAIN: No intraparenchymal hemorrhage, mass effect nor midline shift. The ventricles and sulci are normal for age. Patchy supratentorial white matter hypodensities less than expected for patient's age, though non-specific are most compatible with chronic small vessel ischemic disease. Faint small hypodensity LEFT temporal lobe (axial 18/34). No abnormal extra-axial fluid collections. Basal cisterns are patent. VASCULAR: Moderate calcific atherosclerosis of the carotid siphons. SKULL: No skull fracture. Large tooth 13 periapical abscess. No significant scalp soft tissue swelling. SINUSES/ORBITS: Mild lobulated RIGHT maxillary sinus mucosal thickening without air-fluid levels. Mastoid air cells are well aerated. The included ocular globes and orbital contents are non-suspicious. OTHER: None. CTA NECK AORTIC ARCH: Normal appearance of the thoracic arch, normal branch pattern. Mild calcific atherosclerosis. The origins of the innominate, left Common carotid artery and subclavian artery are widely patent. RIGHT CAROTID SYSTEM:  Common carotid artery is widely patent, mild calcific atherosclerosis. Normal appearance of the carotid bifurcation without hemodynamically significant stenosis by NASCET criteria. Moderate atherosclerosis proximal LEFT internal carotid artery without stenosis. LEFT CAROTID SYSTEM: Common carotid artery is widely patent, mild calcific atherosclerosis. Patent carotid carotid bifurcation without hemodynamically significant stenosis by NASCET criteria, moderate eccentric calcific atherosclerosis. Normal appearance of the included internal carotid artery. VERTEBRAL ARTERIES:Codominant vertebral artery's. Mild calcific atherosclerosis proximal bilateral vertebral artery's which are patent throughout the course. Mild extrinsic compression due to degenerative cervical spine. SKELETON: No acute osseous process though bone windows have not been submitted. Severe C5-6 degenerative disc, moderate at C3-4. Moderate to severe RIGHT C3-4 and severe LEFT greater than RIGHT C5-6 neural foraminal narrowing. Moderate canal stenosis C5-6. OTHER NECK: Soft tissues of the neck are non-acute though, not tailored for evaluation. CTA HEAD ANTERIOR CIRCULATION: Patent cervical internal carotid arteries, petrous, cavernous and supra clinoid internal carotid arteries. Moderate stenosis RIGHT cavernous  internal carotid artery. Wide necked 4 mm posteriorly directed aneurysm LEFT anterior genu (series 10, axial 245/349). Wide necked 4 mm laterally directed RIGHT paraophthalmic aneurysm. Widely patent anterior communicating artery. Patent anterior and middle cerebral arteries. Severe stenosis LEFT M2 segment, superior division. No large vessel occlusion, contrast extravasation. POSTERIOR CIRCULATION: Patent vertebral arteries, vertebrobasilar junction and basilar artery, as well as main branch vessels. Patent posterior cerebral arteries, mild luminal regularity. No large vessel occlusion, significant stenosis, contrast extravasation or aneurysm.  VENOUS SINUSES: Major dural venous sinuses are patent though not tailored for evaluation on this angiographic examination. ANATOMIC VARIANTS: Aplastic RIGHT A1 segment, bilateral anterior cerebral arteries arise from LEFT A1-2 junction. DELAYED PHASE: No abnormal intracranial enhancement. MIP images reviewed. IMPRESSION: CT HEAD: 1. Small LEFT temporal lobe acute infarct versus beam hardening artifact. 2. Otherwise negative noncontrast CT HEAD for age. CTA NECK: 1. Atherosclerosis without hemodynamically significant stenosis or acute vascular process. 2. Degenerative cervical spine resulting in moderate canal stenosis C5-6. Severe C3-4 and C5-6 neural foraminal narrowing. CTA HEAD: 1. Severe stenosis LEFT M2 segment without emergent large vessel occlusion. 2. 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 3. Atherosclerosis with moderate stenosis RIGHT cavernous internal carotid artery. 4. Neuro-Interventional Radiology consultation is suggested to evaluate the appropriateness of potential treatment. Non-emergent evaluation can be arranged by calling [phone] during usual hours. Emergency evaluation can be requested by paging [phone]. Aortic Atherosclerosis (ICD10-I70.0). Electronically Signed   By: Awilda Metro M.D.   On: 06/17/2017 23:55   12-lead ECG sinus rhythm, rate 74 (personally reviewed) All prior EKG's in EPIC reviewed with no documented atrial fibrillation  Telemetry sinus rhythm (personally reviewed)  Assessment and Plan:  1. Cryptogenic stroke The patient presents with cryptogenic stroke.  TEE was without source for infection.  I spoke at length with the patient about monitoring for afib with an implantable loop recorder.  Risks, benefits, and alteratives to implantable loop recorder were discussed with the patient today.   At this time, the patient is very clear in their decision to proceed with implantable loop recorder.   Wound care was reviewed with the patient (keep  incision clean and dry for 3 days).  Wound check scheduled and entered in AVS.  Please call with questions.   Gypsy Balsam, NP 06/21/2017 2:17 PM  EP Attending  Patient seen and examined. Agree with above. I have reviewed the indications for insertion of an ILR and she wishes to proceed.   Leonia Reeves.D.

## 2017-06-21 NOTE — Progress Notes (Signed)
Progress Note  Patient Name: Elizabeth Bowman Date of Encounter: 06/21/2017  Primary Cardiologist: Anne Fu  Subjective   No new complaints, no sob, no cp  Inpatient Medications    Scheduled Meds: . aspirin  300 mg Rectal Daily   Or  . aspirin  325 mg Oral Daily  . atorvastatin  80 mg Oral q1800  . clopidogrel  75 mg Oral Daily  . enoxaparin (LOVENOX) injection  40 mg Subcutaneous Q24H  . insulin aspart  0-5 Units Subcutaneous QHS  . insulin aspart  0-9 Units Subcutaneous TID WC  . lisinopril  20 mg Oral Daily  . pantoprazole  40 mg Oral Daily   Continuous Infusions:  PRN Meds: acetaminophen **OR** acetaminophen (TYLENOL) oral liquid 160 mg/5 mL **OR** acetaminophen, ondansetron (ZOFRAN) IV **OR** promethazine   Vital Signs    Vitals:   06/20/17 0812 06/20/17 1341 06/20/17 2132 06/21/17 0544  BP: 116/62 (!) 149/86 (!) 108/55 117/60  Pulse: 60 65 (!) 59 (!) 50  Resp:  16 17 18   Temp:  98.4 F (36.9 C) 98.4 F (36.9 C) 97.7 F (36.5 C)  TempSrc:  Oral Oral Oral  SpO2:  100% 98% 99%  Weight:      Height:        Intake/Output Summary (Last 24 hours) at 06/21/17 0845 Last data filed at 06/21/17 0806  Gross per 24 hour  Intake              100 ml  Output             1600 ml  Net            -1500 ml   Filed Weights   06/17/17 2133 06/18/17 1547  Weight: 160 lb (72.6 kg) 167 lb 9.6 oz (76 kg)    Telemetry    NSR - Personally Reviewed  ECG    NSR - Personally Reviewed  Physical Exam   GEN: No acute distress.   Neck: No JVD Cardiac: RRR, no murmurs, rubs, or gallops.  Respiratory: Clear to auscultation bilaterally. GI: Soft, nontender, non-distended  MS: No edema; No deformity. Neuro:  Nonfocal  Psych: Normal affect   Labs    Chemistry Recent Labs Lab 06/17/17 2157 06/20/17 0600 06/21/17 0440  NA 139 139 139  K 3.2* 3.2* 4.4  CL 103 104 106  CO2 25 27 26   GLUCOSE 136* 100* 107*  BUN 13 17 17   CREATININE 0.62 0.80 0.94  CALCIUM 9.4 9.2 9.1   GFRNONAA >60 >60 >60  GFRAA >60 >60 >60  ANIONGAP 11 8 7      Hematology Recent Labs Lab 06/17/17 2157 06/20/17 0600 06/21/17 0440  WBC 6.1 7.2 5.7  RBC 3.52* 3.76* 3.68*  HGB 11.6* 11.9* 11.6*  HCT 34.4* 35.4* 35.4*  MCV 97.7 94.1 96.2  MCH 33.0 31.6 31.5  MCHC 33.7 33.6 32.8  RDW 11.8 12.3 12.6  PLT 253 262 265    Cardiac Enzymes Recent Labs Lab 06/17/17 2157  TROPONINI <0.03   No results for input(s): TROPIPOC in the last 168 hours.   BNPNo results for input(s): BNP, PROBNP in the last 168 hours.   DDimer No results for input(s): DDIMER in the last 168 hours.   Radiology    Mr Brain Wo Contrast  Result Date: 06/19/2017 CLINICAL DATA:  Headache, slurred speech, and ataxia. EXAM: MRI HEAD WITHOUT CONTRAST TECHNIQUE: Multiplanar, multiecho pulse sequences of the brain and surrounding structures were obtained without intravenous contrast. COMPARISON:  Head  CT/CTA 06/17/2017 FINDINGS: Brain: There is a small acute left MCA territory infarct predominantly involving the posterior insula with a punctate infarct in the left corona radiata. No intracranial hemorrhage, mass, midline shift, or extra-axial fluid collection is identified. The ventricles and sulci are normal. No significant chronic white matter disease is seen for age. Vascular: Major intracranial vascular flow voids are preserved. Abnormal FLAIR signal is present in multiple distal left MCA inferior division branches likely reflecting reduced flow due to the high-grade proximal stenosis seen on CTA. Skull and upper cervical spine: Unremarkable bone marrow signal. Sinuses/Orbits: Unremarkable orbits. Mild right maxillary sinus mucosal thickening. Clear mastoid air cells. Other: None. IMPRESSION: Small acute left MCA infarct involving the insula. Electronically Signed   By: Sebastian Ache M.D.   On: 06/19/2017 10:16    Cardiac Studies   EF 30%  Patient Profile         Elizabeth Bowman is a 66 y.o. female with a hx of  DM (A1C 6.1), HTN, 20-30 years of prior tobacco abuse, dyslipidemia who is being seen for the evaluation of newly recognized LV dysfunction at the request of Dr. Hanley Ben.   Assessment & Plan    CVA/ dilated cardiomyopathy  - per neuro  - acute L MCA infarct  - neuro does not wish to use warfarin currently   - TEE today at 3pm  - Patient is hesitant about loop recorder. Could use 30 day event monitor if still hesitant.   - would start coreg 3.125 BID if ok with neuro (permissive HTN). On ACE-I  - will repeat ECHO in 3 months. If still low--> further ischemic testing.    Signed, Donato Schultz, MD  06/21/2017, 8:45 AM

## 2017-06-21 NOTE — Care Management Important Message (Signed)
Important Message  Patient Details  Name: Elizabeth Bowman MRN: [id number] Date of Birth: 12-29-1950   Medicare Important Message Given:  Yes    Kyla Balzarine 06/21/2017, 9:55 AM

## 2017-06-22 ENCOUNTER — Encounter (HOSPITAL_COMMUNITY): Payer: Self-pay | Admitting: Internal Medicine

## 2017-07-06 ENCOUNTER — Ambulatory Visit (INDEPENDENT_AMBULATORY_CARE_PROVIDER_SITE_OTHER): Payer: Self-pay | Admitting: *Deleted

## 2017-07-06 DIAGNOSIS — I639 Cerebral infarction, unspecified: Secondary | ICD-10-CM

## 2017-07-06 LAB — CUP PACEART INCLINIC DEVICE CHECK
Implantable Pulse Generator Implant Date: 20180822
MDC IDC SESS DTM: 20180906164404

## 2017-07-06 NOTE — Progress Notes (Signed)
Wound check appointment. Steri-strips removed. Wound without redness or edema. Incision edges approximated, wound well healed. Battery status: Good. R-waves 0.68 mV. 0 symptom episodes, 0 tachy episodes, 0 pause episodes, 0 brady episodes. 0 AF episodes (0% burden). Monthly summary reports and ROV with GT PRN.

## 2017-07-21 ENCOUNTER — Ambulatory Visit (INDEPENDENT_AMBULATORY_CARE_PROVIDER_SITE_OTHER): Payer: Medicare Other | Admitting: *Deleted

## 2017-07-21 DIAGNOSIS — I639 Cerebral infarction, unspecified: Secondary | ICD-10-CM

## 2017-07-24 ENCOUNTER — Ambulatory Visit (INDEPENDENT_AMBULATORY_CARE_PROVIDER_SITE_OTHER): Payer: Medicare Other | Admitting: Nurse Practitioner

## 2017-07-24 ENCOUNTER — Encounter (INDEPENDENT_AMBULATORY_CARE_PROVIDER_SITE_OTHER): Payer: Self-pay

## 2017-07-24 ENCOUNTER — Encounter: Payer: Self-pay | Admitting: Nurse Practitioner

## 2017-07-24 VITALS — BP 102/70 | HR 89 | Ht 67.0 in | Wt 161.8 lb

## 2017-07-24 DIAGNOSIS — I5022 Chronic systolic (congestive) heart failure: Secondary | ICD-10-CM | POA: Diagnosis not present

## 2017-07-24 DIAGNOSIS — I639 Cerebral infarction, unspecified: Secondary | ICD-10-CM | POA: Diagnosis not present

## 2017-07-24 MED ORDER — CLOPIDOGREL BISULFATE 75 MG PO TABS: 75.0000 mg | ORAL_TABLET | Freq: Every day | ORAL | 3 refills | Status: DC

## 2017-07-24 MED ORDER — LISINOPRIL 10 MG PO TABS: 10.0000 mg | ORAL_TABLET | Freq: Every day | ORAL | 3 refills | Status: DC

## 2017-07-24 MED ORDER — CARVEDILOL 3.125 MG PO TABS: 3.1250 mg | ORAL_TABLET | Freq: Two times a day (BID) | ORAL | 3 refills | Status: DC

## 2017-07-24 NOTE — Patient Instructions (Addendum)
We will be checking the following labs today - BMET and CBC   Medication Instructions:    Continue with your current medicines. BUT  I am stopping the Lisinopril HCT   I am just going to have you on plain Lisinopril and at a lower dose - 10 mg a day - this is at your drug store    Testing/Procedures To Be Arranged:  N/A  Follow-Up:   See me next week. We will call you tomorrow with an appointment.     Other Special Instructions:   N/A    If you need a refill on your cardiac medications before your next appointment, please call your pharmacy.   Call the [facility] office at [phone] if you have any questions, problems or concerns.

## 2017-07-24 NOTE — Progress Notes (Addendum)
CARDIOLOGY OFFICE NOTE  Date:  07/24/2017    Elizabeth Bowman Date of Birth: Sep 16, 1951 Medical Record #[id number]  PCP:  Ardyth Gal, MD  Cardiologist:  [facility]    Chief Complaint  Patient presents with  . Follow-up    Post hospital visit - seen for Dr. Rosine Abe    History of Present Illness: Elizabeth Bowman is a 66 y.o. female who presents today for a post hospital visit. Seen for Dr. Rosine Abe.   She has a history of DM, HTN, prior tobacco abuse and HLD.   Admitted last month with stroke - she delayed presenting to the hospital. CT angio head/neck possible acute left temporal infarct, severe stenosis of the L M2 segment, bilateral paraophthalmic wide necked 4 mm aneurysms, moderate stenosis of right cavernous internal carotid artery; MR brain showed small acute L MCA infarct.  Outside the window for TPA - no code stroke initiated. As part of her work up echo was obtained showing EF of 30 to 35%. Noted newly diagnosed cerebrovascular aneurysms. She is being treated with aspirin and Plavix. She has had a loop implanted looking for AF.   Comes in today. Here with her husband. Has been home about a month - some good days and some "weak" days. Typically can't stay up much past an hour or so - has to go lay down because of being weak. BP is running low. She says it has been down in the 60's systolic. Went to a wake today - may not have eaten. Feels very weak today. BP is quite low here today. No chest pain. Not short of breath. Notes her speech has improved. No swelling. Other than the weakness, she feels like she is doing ok.   Past Medical History:  Diagnosis Date  . Complication of anesthesia   . Diabetes mellitus (HCC)   . Former tobacco use   . HTN (hypertension)   . Hyperlipidemia     Past Surgical History:  Procedure Laterality Date  . LOOP RECORDER INSERTION N/A 06/21/2017   Procedure: LOOP RECORDER INSERTION;  Surgeon: Marinus Maw, MD;  Location:  [facility] INVASIVE CV LAB;  Service: Cardiovascular;  Laterality: N/A;  . TEE WITHOUT CARDIOVERSION N/A 06/21/2017   Procedure: TRANSESOPHAGEAL ECHOCARDIOGRAM (TEE);  Surgeon: Lars Masson, MD;  Location[facility] ENDOSCOPY;  Service: Cardiovascular;  Laterality: N/A;     Medications: Current Meds  Medication Sig  . aspirin 325 MG tablet Take 1 tablet (325 mg total) by mouth daily.  Marland Kitchen atorvastatin (LIPITOR) 80 MG tablet Take 1 tablet (80 mg total) by mouth daily at 6 PM.  . carvedilol (COREG) 3.125 MG tablet Take 1 tablet (3.125 mg total) by mouth 2 (two) times daily with a meal.  . clopidogrel (PLAVIX) 75 MG tablet Take 1 tablet (75 mg total) by mouth daily.  . metFORMIN (GLUCOPHAGE) 500 MG tablet Take by mouth 2 (two) times daily with a meal.  . pantoprazole (PROTONIX) 40 MG tablet Take 40 mg by mouth daily.  . [DISCONTINUED] carvedilol (COREG) 3.125 MG tablet Take 1 tablet (3.125 mg total) by mouth 2 (two) times daily with a meal.  . [DISCONTINUED] clopidogrel (PLAVIX) 75 MG tablet Take 1 tablet (75 mg total) by mouth daily.  . [DISCONTINUED] lisinopril-hydrochlorothiazide (PRINZIDE,ZESTORETIC) 20-12.5 MG tablet Take 1 tablet by mouth daily.     Allergies: No Known Allergies  Social History: The patient  reports that she quit smoking about 10 years ago. She has quit using smokeless tobacco.  She reports that she drinks alcohol. She reports that she does not use drugs.   Family History: The patient's family history includes Cancer in her brother and father.   Review of Systems: Please see the history of present illness.   Otherwise, the review of systems is positive for none.   All other systems are reviewed and negative.   Physical Exam: VS:  BP 102/70 (BP Location: Left Arm, Patient Position: Sitting, Cuff Size: Normal)   Pulse 89   Ht 5\' 7"  (1.702 m)   Wt 161 lb 12.8 oz (73.4 kg)   SpO2 96% Comment: at rest  BMI 25.34 kg/m  .  BMI Body mass index is 25.34 kg/m.  Wt Readings  from Last 3 Encounters:  07/24/17 161 lb 12.8 oz (73.4 kg)  06/21/17 165 lb (74.8 kg)   BP by me is 70/60.   General: Pleasant. Elderly female who is alert and in no acute distress.   HEENT: Normal.  Neck: Supple, no JVD, carotid bruits, or masses noted.  Cardiac: Regular rate and rhythm. No murmurs, rubs, or gallops. No edema.  Respiratory:  Lungs are clear to auscultation bilaterally with normal work of breathing.  GI: Soft and nontender.  MS: No deformity or atrophy. Gait and ROM intact.  Skin: Warm and dry. Color is normal.  Neuro:  Strength and sensation are intact and no gross focal deficits noted.  Psych: Alert, appropriate and with normal affect.   LABORATORY DATA:  EKG:  EKG is not ordered today.  Lab Results  Component Value Date   WBC 5.7 06/21/2017   HGB 11.6 (L) 06/21/2017   HCT 35.4 (L) 06/21/2017   PLT 265 06/21/2017   GLUCOSE 107 (H) 06/21/2017   CHOL 180 06/19/2017   TRIG 82 06/19/2017   HDL 48 06/19/2017   LDLCALC 116 (H) 06/19/2017   NA 139 06/21/2017   K 4.4 06/21/2017   CL 106 06/21/2017   CREATININE 0.94 06/21/2017   BUN 17 06/21/2017   CO2 26 06/21/2017   TSH 2.701 07-16-17   INR 1.06 06/17/2017   HGBA1C 6.1 (H) 06/18/2017     BNP (last 3 results) No results for input(s): BNP in the last 8760 hours.  ProBNP (last 3 results) No results for input(s): PROBNP in the last 8760 hours.   Other Studies Reviewed Today:  2d echo Jul 16, 2017 Study Conclusions  - Left ventricle: The cavity size was mildly dilated. Wall thickness was increased in a pattern of mild LVH. Systolic function was moderately to severely reduced. The estimated ejection fraction was in the range of 30% to 35%. Diffuse hypokinesis. Doppler parameters are consistent with abnormal left ventricular relaxation (grade 1 diastolic dysfunction). - Mitral valve: Calcified annulus. Impressions: - Moderate to severe global reduction in LV systolic function;  mild LVH; mild diastolic dysfunction; mild LVE.  Assessment/Plan:  Acute left MCA infarct -Likely embolic -CT head showed small left temporal lobe acute infarct versus beam hardening artifact -CTA showed severe stenosis left M2 segment without emergent large vessel occlusion -MRI brain showed small left acute MCA involving the insula -Echocardiogram EF of 30-35%, grade 1 diastolic dysfunction -TEE: EF of 3035%, diffuse hypokinesis, no evidence of thrombus or vegetation, no defect or patent foramen ovale identified -Carotid Doppler showed no significant multiple dynamic stenosis of either carotid bifurcation -She is on statin, Plavix, aspirin - needs refills today.    New onset Cardiomyopathy -EF noted 30-35% -On ACE/beta blocker. Her BP is very soft - she is symptomatic.  Stopping her Lisinopril HCt and changing to just plain Lisinopril and at a lower dose at 10 mg a day. Lab today. She is compensated other than the low BP. She was given crackers and water here - BP up to 104/60 by me.   Need to readdress on return work up needed to determine if NICM or ischemic. ?stress testing to be ordered.   Diabetes mellitus, type II -followed by PCP  Hyperlipidemia - she is on statin therapy.   Essential hypertension -See above - med changes as above.   Hypokalemia -rechecking lab today.    Current medicines are reviewed with the patient today.  The patient does not have concerns regarding medicines other than what has been noted above.  The following changes have been made:  See above.  Labs/ tests ordered today include:    Orders Placed This Encounter  Procedures  . Basic metabolic panel  . CBC     Disposition:   FU with me in about a week.    Patient is agreeable to this plan and will call if any problems develop in the interim.   SignedNorma Fredrickson, NP  07/24/2017 4:21 PM  [facility] [address] [place],  [place]  [address] Phone: [phone] Fax: [phone]

## 2017-07-24 NOTE — Progress Notes (Signed)
Carelink Summary Report / Loop Recorder 

## 2017-07-25 ENCOUNTER — Telehealth: Payer: Self-pay | Admitting: *Deleted

## 2017-07-25 LAB — BASIC METABOLIC PANEL
BUN/Creatinine Ratio: 14 (ref 12–28)
BUN: 19 mg/dL (ref 8–27)
CO2: 25 mmol/L (ref 20–29)
Calcium: 10.2 mg/dL (ref 8.7–10.3)
Chloride: 100 mmol/L (ref 96–106)
Creatinine, Ser: 1.33 mg/dL — ABNORMAL HIGH (ref 0.57–1.00)
GFR calc Af Amer: 47 mL/min/{1.73_m2} — ABNORMAL LOW (ref >59)
GFR calc non Af Amer: 41 mL/min/{1.73_m2} — ABNORMAL LOW (ref >59)
Glucose: 107 mg/dL — ABNORMAL HIGH (ref 65–99)
Potassium: 3.9 mmol/L (ref 3.5–5.2)
Sodium: 140 mmol/L (ref 134–144)

## 2017-07-25 LAB — CUP PACEART REMOTE DEVICE CHECK
MDC IDC PG IMPLANT DT: 20180822
MDC IDC SESS DTM: 20180921183842

## 2017-07-25 LAB — CBC
Hematocrit: 35.7 % (ref 34.0–46.6)
Hemoglobin: 12.2 g/dL (ref 11.1–15.9)
MCH: 32.4 pg (ref 26.6–33.0)
MCHC: 34.2 g/dL (ref 31.5–35.7)
MCV: 95 fL (ref 79–97)
Platelets: 307 10*3/uL (ref 150–379)
RBC: 3.76 x10E6/uL — ABNORMAL LOW (ref 3.77–5.28)
RDW: 13.1 % (ref 12.3–15.4)
WBC: 6.9 10*3/uL (ref 3.4–10.8)

## 2017-07-25 NOTE — Telephone Encounter (Signed)
Calling pt to schedule appointment for upcoming week for a 1 week f/u.

## 2017-08-02 ENCOUNTER — Ambulatory Visit (INDEPENDENT_AMBULATORY_CARE_PROVIDER_SITE_OTHER): Payer: Medicare Other | Admitting: Nurse Practitioner

## 2017-08-02 ENCOUNTER — Encounter: Payer: Self-pay | Admitting: Nurse Practitioner

## 2017-08-02 VITALS — BP 136/78 | HR 78 | Ht 67.0 in | Wt 163.8 lb

## 2017-08-02 DIAGNOSIS — I639 Cerebral infarction, unspecified: Secondary | ICD-10-CM

## 2017-08-02 DIAGNOSIS — I5022 Chronic systolic (congestive) heart failure: Secondary | ICD-10-CM

## 2017-08-02 MED ORDER — CARVEDILOL 3.125 MG PO TABS: 6.2500 mg | ORAL_TABLET | Freq: Two times a day (BID) | ORAL | 3 refills | Status: DC

## 2017-08-02 NOTE — Progress Notes (Signed)
CARDIOLOGY OFFICE NOTE  Date:  08/02/2017    Elizabeth Bowman Date of Birth: 1950/12/20 Medical Record #[id number]  PCP:  Ardyth Gal, MD  Cardiologist:  Rick Duff    Chief Complaint  Patient presents with  . Cardiomyopathy    Follow up visit - seen for Dr. Rosine Abe    History of Present Illness: Elizabeth Bowman is a 66 y.o. female who presents today for a follow up visit. Seen for Dr. Rosine Abe.   She has a history of DM, HTN, prior tobacco abuse and HLD.   Admitted in August with stroke - she delayed presenting to the hospital. CT angio head/neck possible acute left temporal infarct, severe stenosis of the L M2 segment, bilateral paraophthalmic wide necked 4 mm aneurysms,moderate stenosis of right cavernous internal carotid artery; MR brain showed small acute L MCA infarct.  Outside the window for TPA - no code stroke initiated. As part of her work up echo was obtained showing EF of 30 to 35%. Noted newly diagnosed cerebrovascular aneurysms. She is being treated with aspirin and Plavix. She has had a loop implanted looking for AF.   I saw her about 10 days ago - BP pretty low - symptomatic even here in the office - had to cut some of her medicines back - changed to just plain ACE and at lower dose - stopped HCTZ.   Comes in today. Here with her husband. She is doing well. Her cuff matches up pretty well. She feels better. No more "weak" spells. No chest pain. Not short of breath. No swelling. Not dizzy. Overall, she is doing better.   Past Medical History:  Diagnosis Date  . Complication of anesthesia   . Diabetes mellitus (HCC)   . Former tobacco use   . HTN (hypertension)   . Hyperlipidemia     Past Surgical History:  Procedure Laterality Date  . LOOP RECORDER INSERTION N/A 06/21/2017   Procedure: LOOP RECORDER INSERTION;  Surgeon: Marinus Maw, MD;  Location: [facility] INVASIVE CV LAB;  Service: Cardiovascular;  Laterality: N/A;  . TEE WITHOUT CARDIOVERSION  N/A 06/21/2017   Procedure: TRANSESOPHAGEAL ECHOCARDIOGRAM (TEE);  Surgeon: Lars Masson, MD;  Location: [facility] ENDOSCOPY;  Service: Cardiovascular;  Laterality: N/A;     Medications: Current Meds  Medication Sig  . aspirin 325 MG tablet Take 1 tablet (325 mg total) by mouth daily.  Marland Kitchen atorvastatin (LIPITOR) 80 MG tablet Take 1 tablet (80 mg total) by mouth daily at 6 PM.  . carvedilol (COREG) 3.125 MG tablet Take 2 tablets (6.25 mg total) by mouth 2 (two) times daily with a meal.  . clopidogrel (PLAVIX) 75 MG tablet Take 1 tablet (75 mg total) by mouth daily.  Marland Kitchen lisinopril (PRINIVIL,ZESTRIL) 10 MG tablet Take 1 tablet (10 mg total) by mouth daily.  . metFORMIN (GLUCOPHAGE) 500 MG tablet Take by mouth 2 (two) times daily with a meal.  . pantoprazole (PROTONIX) 40 MG tablet Take 40 mg by mouth daily.  . [DISCONTINUED] carvedilol (COREG) 3.125 MG tablet Take 1 tablet (3.125 mg total) by mouth 2 (two) times daily with a meal.     Allergies: No Known Allergies  Social History: The patient  reports that she quit smoking about 10 years ago. She has quit using smokeless tobacco. She reports that she drinks alcohol. She reports that she does not use drugs.   Family History: The patient's family history includes Cancer in her brother and father.   Review of Systems:  Please see the history of present illness.   Otherwise, the review of systems is positive for none.   All other systems are reviewed and negative.   Physical Exam: VS:  BP 136/78 (BP Location: Left Arm, Patient Position: Sitting, Cuff Size: Normal)   Pulse 78   Ht 5\' 7"  (1.702 m)   Wt 163 lb 12.8 oz (74.3 kg)   SpO2 97% Comment: at rest  BMI 25.65 kg/m  .  BMI Body mass index is 25.65 kg/m.  Wt Readings from Last 3 Encounters:  08/02/17 163 lb 12.8 oz (74.3 kg)  07/24/17 161 lb 12.8 oz (73.4 kg)  06/21/17 165 lb (74.8 kg)    General: Pleasant. Well developed, well nourished and in no acute distress.   HEENT: Normal.   Neck: Supple, no JVD, carotid bruits, or masses noted.  Cardiac: Regular rate and rhythm. No murmurs, rubs, or gallops. No edema.  Respiratory:  Lungs are clear to auscultation bilaterally with normal work of breathing.  GI: Soft and nontender.  MS: No deformity or atrophy. Gait and ROM intact.  Skin: Warm and dry. Color is normal.  Neuro:  Strength and sensation are intact and no gross focal deficits noted.  Psych: Alert, appropriate and with normal affect.   LABORATORY DATA:  EKG:  EKG is not ordered today.  Lab Results  Component Value Date   WBC 6.9 07/24/2017   HGB 12.2 07/24/2017   HCT 35.7 07/24/2017   PLT 307 07/24/2017   GLUCOSE 107 (H) 07/24/2017   CHOL 180 06/19/2017   TRIG 82 06/19/2017   HDL 48 06/19/2017   LDLCALC 116 (H) 06/19/2017   NA 140 07/24/2017   K 3.9 07/24/2017   CL 100 07/24/2017   CREATININE 1.33 (H) 07/24/2017   BUN 19 07/24/2017   CO2 25 07/24/2017   TSH 2.701 29-Jun-2017   INR 1.06 06/17/2017   HGBA1C 6.1 (H) 06/18/2017     BNP (last 3 results) No results for input(s): BNP in the last 8760 hours.  ProBNP (last 3 results) No results for input(s): PROBNP in the last 8760 hours.   Other Studies Reviewed Today:  2d echo 06-29-17 Study Conclusions  - Left ventricle: The cavity size was mildly dilated. Wall thickness was increased in a pattern of mild LVH. Systolic function was moderately to severely reduced. The estimated ejection fraction was in the range of 30% to 35%. Diffuse hypokinesis. Doppler parameters are consistent with abnormal left ventricular relaxation (grade 1 diastolic dysfunction). - Mitral valve: Calcified annulus. Impressions: - Moderate to severe global reduction in LV systolic function; mild LVH; mild diastolic dysfunction; mild LVE.  Assessment/Plan:  Acute left MCA infarct -Likely embolic -CT head showed small left temporal lobe acute infarct versus beam hardening artifact -CTA showed  severe stenosis left M2 segment without emergent large vessel occlusion -MRIbrain showed small left acute MCA involving the insula -Echocardiogram EF of 30-35%, grade 1 diastolic dysfunction -TEE: EF of 3035%, diffuse hypokinesis, no evidence of thrombus or vegetation, no defect or patent foramen ovale identified -Carotid Doppler showed no significant multiple dynamic stenosis of either carotid bifurcation -She is on statin, Plavix, aspirin    New onset Cardiomyopathy -EF noted 30-35% -On ACE/beta blocker. Her BP was very soft - she was symptomatic. Had to cut her HCTZ out and reduced her ACE. She is much better clinically - will try to increase her Coreg to 6.25 mg BID but with the understanding that if her BP drops again - ok to  cut the dose back to 3.125 mg BID.   Will arrange Lexiscan as well to rule out ischemia as the etiology for her LV dysfunction. She has multiple CV risk factors.    Diabetes mellitus, type II -followed by PCP  Hyperlipidemia - she is on statin therapy.   Essential hypertension - BP has improved. Trying to increase Coreg  Hypokalemia - improved on recheck.    Current medicines are reviewed with the patient today.  The patient does not have concerns regarding medicines other than what has been noted above.  The following changes have been made:  See above.  Labs/ tests ordered today include:    Orders Placed This Encounter  Procedures  . MYOCARDIAL PERFUSION IMAGING     Disposition:   FU with me in about a month.   Patient is agreeable to this plan and will call if any problems develop in the interim.   SignedNorma Fredrickson, NP  08/02/2017 4:00 PM  [facility] [address] [place], [place]  [address] Phone: [phone] Fax: [phone]

## 2017-08-02 NOTE — Patient Instructions (Addendum)
We will be checking the following labs today - NONE   Medication Instructions:    Continue with your current medicines. BUT  I would like to try and increase your Coreg to 6.25 mg twice a day - take 2 of your 3.125 mg tablets twice a day (4 a day total). If you start feeling bad/BP goes back down - go back to just one pill twice a day    Testing/Procedures To Be Arranged:  Lexiscan Myoview - to look for blocked arteries that may be causing this weakness of your heart muscle  Follow-Up:   See me in about a month.     Other Special Instructions:   N/A    If you need a refill on your cardiac medications before your next appointment, please call your pharmacy.   Call the [facility] office at [phone] if you have any questions, problems or concerns.

## 2017-08-15 ENCOUNTER — Telehealth: Payer: Self-pay | Admitting: Nurse Practitioner

## 2017-08-15 NOTE — Telephone Encounter (Signed)
Patient called to request refills for Metformin.  Instructed patient to contact PCP to fill DMII medication. Confirmed she has ample cardiac medications. She was grateful for call and agrees with treatment plan.

## 2017-08-15 NOTE — Telephone Encounter (Signed)
New Message     Need new prescription sent in for increased dosage , pt states she takes 2 tablets a day ??  *STAT* If patient is at the pharmacy, call can be transferred to refill team.   1. Which medications need to be refilled? (please list name of each medication and dose if known)  lipitor   2. Which pharmacy/location (including street and city if local pharmacy) is medication to be sent to? [facility]  [address] [place]  3. Do they need a 30 day or 90 day supply?  90

## 2017-08-21 ENCOUNTER — Telehealth (HOSPITAL_COMMUNITY): Payer: Self-pay | Admitting: *Deleted

## 2017-08-21 ENCOUNTER — Ambulatory Visit (INDEPENDENT_AMBULATORY_CARE_PROVIDER_SITE_OTHER): Payer: Medicare Other | Admitting: *Deleted

## 2017-08-21 DIAGNOSIS — I639 Cerebral infarction, unspecified: Secondary | ICD-10-CM | POA: Diagnosis not present

## 2017-08-21 NOTE — Telephone Encounter (Signed)
Patient given detailed instructions per Myocardial Perfusion Study Information Sheet for the test on 08/23/17 at 0945. Patient notified to arrive 15 minutes early and that it is imperative to arrive on time for appointment to keep from having the test rescheduled.  If you need to cancel or reschedule your appointment, please call the office within 24 hours of your appointment. . Patient verbalized understanding.Keghan Mcfarren, Adelene Idler

## 2017-08-21 NOTE — Progress Notes (Signed)
Carelink Summary Report / Loop Recorder 

## 2017-08-22 ENCOUNTER — Telehealth (HOSPITAL_COMMUNITY): Payer: Self-pay | Admitting: *Deleted

## 2017-08-22 NOTE — Telephone Encounter (Signed)
Patient given detailed instructions per Myocardial Perfusion Study Information Sheet for the test on 08/25/17. Patient notified to arrive 15 minutes early and that it is imperative to arrive on time for appointment to keep from having the test rescheduled.  If you need to cancel or reschedule your appointment, please call the office within 24 hours of your appointment. . Patient verbalized understanding. Ricky Ala

## 2017-08-23 ENCOUNTER — Encounter (HOSPITAL_COMMUNITY): Payer: Medicare Other

## 2017-08-23 LAB — CUP PACEART REMOTE DEVICE CHECK
Date Time Interrogation Session: 20181021184046
Implantable Pulse Generator Implant Date: 20180822

## 2017-08-25 ENCOUNTER — Ambulatory Visit (HOSPITAL_COMMUNITY): Payer: Medicare Other | Attending: Cardiovascular Disease

## 2017-08-25 DIAGNOSIS — I5022 Chronic systolic (congestive) heart failure: Secondary | ICD-10-CM | POA: Diagnosis present

## 2017-08-25 LAB — MYOCARDIAL PERFUSION IMAGING
LV dias vol: 161 mL (ref 46–106)
LV sys vol: 103 mL
Peak HR: 87 {beats}/min
RATE: 0.24
Rest HR: 60 {beats}/min
SDS: 5
SRS: 3
SSS: 8
TID: 1.03

## 2017-08-25 MED ORDER — TECHNETIUM TC 99M TETROFOSMIN IV KIT
10.2000 | PACK | Freq: Once | INTRAVENOUS | Status: AC | PRN
  Administered 2017-08-25: 10.2 via INTRAVENOUS
  Filled 2017-08-25: qty 11

## 2017-08-25 MED ORDER — TECHNETIUM TC 99M TETROFOSMIN IV KIT
30.2000 | PACK | Freq: Once | INTRAVENOUS | Status: AC | PRN
  Administered 2017-08-25: 30.2 via INTRAVENOUS
  Filled 2017-08-25: qty 31

## 2017-08-25 MED ORDER — REGADENOSON 0.4 MG/5ML IV SOLN
0.4000 mg | Freq: Once | INTRAVENOUS | Status: AC
  Administered 2017-08-25: 0.4 mg via INTRAVENOUS

## 2017-09-06 ENCOUNTER — Encounter: Payer: Self-pay | Admitting: Nurse Practitioner

## 2017-09-06 ENCOUNTER — Ambulatory Visit (INDEPENDENT_AMBULATORY_CARE_PROVIDER_SITE_OTHER): Payer: Medicare Other | Admitting: Nurse Practitioner

## 2017-09-06 VITALS — BP 126/88 | HR 85 | Ht 67.0 in | Wt 158.1 lb

## 2017-09-06 DIAGNOSIS — R9439 Abnormal result of other cardiovascular function study: Secondary | ICD-10-CM | POA: Diagnosis not present

## 2017-09-06 DIAGNOSIS — I639 Cerebral infarction, unspecified: Secondary | ICD-10-CM | POA: Diagnosis not present

## 2017-09-06 DIAGNOSIS — I5022 Chronic systolic (congestive) heart failure: Secondary | ICD-10-CM

## 2017-09-06 NOTE — Patient Instructions (Addendum)
We will be checking the following labs today - BMET, CBC, PT and PTT   Medication Instructions:    Continue with your current medicines.     Testing/Procedures To Be Arranged:  Cardiac catheterization  Follow-Up:  See Dr. Anne FuSkains in January   Other Special Instructions:   Your provider has recommended a cardiac catheterization  You are scheduled for a cardiac catheterization on Friday, November 9th at 12 noon with Dr. SwazilandJordan or associate.  [facility] at [address], [place] -  2nd Floor Short Stay on Friday, November 9th at 10 AM.    Special note: Every effort is made to have your procedure done on time.   Please understand that emergencies sometimes delay a scheduled   procedure.  No food or drink after midnight on Thursday.  You may take your morning medications with a sip of water on the day of your procedure.  Please take a baby aspirin (81 mg) on the morning of your procedure.   Medications to HOLD - STOP METFORMIN AS OF NOW  Plan for a one night stay -- bring personal belongings.  Bring a current list of your medications and current insurance cards.  You MUST have a responsible person to drive you home. Someone MUST be with you the first 24 hours after you arrive home or your discharge will be delayed. Wear clothes that are easy to get on and off and wear slip on shoes.    Coronary Angiogram A coronary angiogram, also called coronary angiography, is an X-ray procedure used to look at the arteries in the heart. In this procedure, a dye (contrast dye) is injected through a long, hollow tube (catheter). The catheter is about the size of a piece of cooked spaghetti and is inserted through your groin, wrist, or arm. The dye is injected into each artery, and X-rays are then taken to show if there is a blockage in the arteries of your heart.  LET [facility] PROVID[facility] KNOW ABOUT: Any allergies you  have, including allergies to shellfish or contrast dye.  All medicines you are taking, including vitamins, herbs, eye drops, creams, and over-the-counter medicines.  Previous problems you or members of your family have had with the use of anesthetics.  Any blood disorders you have.  Previous surgeries you have had. History of kidney problems or failure.  Other medical conditions you have.  RISKS AND COMPLICATIONS  Generally, a coronary angiogram is a safe procedure. However, about 1 person out of 1000 can have problems that may include: Allergic reaction to the dye. Bleeding/bruising from the access site or other locations. Kidney injury, especially in people with impaired kidney function. Stroke (rare). Heart attack (rare). Irregular rhythms (rare) Death (rare)  BEFORE THE PROCEDURE  Do not eat or drink anything after midnight the night before the procedure or as directed by your health care provider.  Ask your health care provider about changing or stopping your regular medicines. This is especially important if you are taking diabetes medicines or blood thinners.  PROCEDURE You may be given a medicine to help you relax (sedative) before the procedure. This medicine is given through an intravenous (IV) access tube that is inserted into one of your veins.  The area where the catheter will be inserted will be washed and shaved. This is usually done in the groin but may be done in the fold of your arm (near your elbow) or in  the wrist.  A medicine will be given to numb the area where the catheter will be inserted (local anesthetic).  The health care provider will insert the catheter into an artery. The catheter will be guided by using a special type of X-ray (fluoroscopy) of the blood vessel being examined.  A special dye will then be injected into the catheter, and X-rays will be taken. The dye will help to show where any narrowing or blockages are located in the heart  arteries.    AFTER THE PROCEDURE  If the procedure is done through the leg, you will be kept in bed lying flat for several hours. You will be instructed to not bend or cross your legs. The insertion site will be checked frequently.  The pulse in your feet or wrist will be checked frequently.  Additional blood tests, X-rays, and an electrocardiogram may be done.         If you need a refill on your cardiac medications before your next appointment, please call your pharmacy.   Call the [facility] office at [phone] if you have any questions, problems or concerns.

## 2017-09-06 NOTE — Progress Notes (Signed)
CARDIOLOGY OFFICE NOTE  Date:  09/06/2017    Elizabeth Bowman Date of Birth: 26-Jan-1951 Medical Record #[id number]  PCP:  Ardyth Gal, MD  Cardiologist:  Rick Duff    Chief Complaint  Patient presents with  . Follow-up    Follow up visit after Myoview - seen for Dr. Anne Fu    History of Present Illness: Elizabeth Bowman is a 67 y.o. female who presents today for a follow up visit. Seen for Dr. Rosine Abe.   She has a history of DM, HTN, prior tobacco abuse and HLD.   Admitted in August with stroke - she delayed presenting to the hospital. CT angio head/neck possible acute left temporal infarct, severe stenosis of the L M2 segment, bilateral paraophthalmic wide necked 4 mm aneurysms,moderate stenosis of right cavernous internal carotid artery; MR brain showed small acute L MCA infarct. Outside the window for TPA - no code stroke initiated. As part of her work up echo was obtained showing EF of 30 to 35%. Noted newly diagnosed cerebrovascular aneurysms. She is being treated with aspirin and Plavix. She has had a loop implanted looking for AF.   I saw her back in September for her post hospital visit - BP pretty low - symptomatic even here in the office - had to cut some of her medicines back - changed to just plain ACE and at lower dose - stopped HCTZ. On follow up visit in October - she was doing much better. No symptoms. BP better. Referred on for Lexiscan to make sure that CAD was not playing a role with her cardiomyopathy - see below.   Comes in today. Here with her husband. Need to discuss possible cardiac catheterization. She says she is doing well. No chest pain. Not short of breath. No more "weak" spells. No swelling. She is not doing much around the house - but feels like she could. She feels as if she has recovered well from her stroke back in August. She is on DAPT with aspirin/Plavix.   Past Medical History:  Diagnosis Date  . Complication of anesthesia   .  Diabetes mellitus (HCC)   . Former tobacco use   . HTN (hypertension)   . Hyperlipidemia     History reviewed. No pertinent surgical history.   Medications: Current Meds  Medication Sig  . aspirin 325 MG tablet Take 1 tablet (325 mg total) by mouth daily.  Marland Kitchen atorvastatin (LIPITOR) 80 MG tablet Take 1 tablet (80 mg total) by mouth daily at 6 PM.  . carvedilol (COREG) 3.125 MG tablet Take 2 tablets (6.25 mg total) by mouth 2 (two) times daily with a meal.  . clopidogrel (PLAVIX) 75 MG tablet Take 1 tablet (75 mg total) by mouth daily.  Marland Kitchen lisinopril (PRINIVIL,ZESTRIL) 10 MG tablet Take 1 tablet (10 mg total) by mouth daily.  . metFORMIN (GLUCOPHAGE) 500 MG tablet Take by mouth 2 (two) times daily with a meal.  . pantoprazole (PROTONIX) 40 MG tablet Take 40 mg by mouth daily.     Allergies: No Known Allergies  Social History: The patient  reports that she quit smoking about 10 years ago. She has quit using smokeless tobacco. She reports that she drinks alcohol. She reports that she does not use drugs.   Family History: The patient's family history includes Cancer in her brother and father.   Review of Systems: Please see the history of present illness.   Otherwise, the review of systems is positive for none.  All other systems are reviewed and negative.   Physical Exam: VS:  BP 126/88 (BP Location: Left Arm, Patient Position: Sitting, Cuff Size: Normal)   Pulse 85   Ht 5\' 7"  (1.702 m)   Wt 158 lb 1.9 oz (71.7 kg)   SpO2 98% Comment: at rest  BMI 24.77 kg/m  .  BMI Body mass index is 24.77 kg/m.  Wt Readings from Last 3 Encounters:  09/06/17 158 lb 1.9 oz (71.7 kg)  08/02/17 163 lb 12.8 oz (74.3 kg)  07/24/17 161 lb 12.8 oz (73.4 kg)    General: Pleasant. Well developed, well nourished and in no acute distress. Weight is down a few pounds.   HEENT: Normal.  Neck: Supple, no JVD, carotid bruits, or masses noted.  Cardiac: Regular rate and rhythm. No murmurs, rubs, or  gallops. No edema.  Respiratory:  Lungs are clear to auscultation bilaterally with normal work of breathing.  GI: Soft and nontender.  MS: No deformity or atrophy. Gait and ROM intact.  Skin: Warm and dry. Color is normal.  Neuro:  Strength and sensation are intact and no gross focal deficits noted.  Psych: Alert, appropriate and with normal affect.   LABORATORY DATA:  EKG:  EKG is not ordered today.  Lab Results  Component Value Date   WBC 6.9 07/24/2017   HGB 12.2 07/24/2017   HCT 35.7 07/24/2017   PLT 307 07/24/2017   GLUCOSE 107 (H) 07/24/2017   CHOL 180 06/19/2017   TRIG 82 06/19/2017   HDL 48 06/19/2017   LDLCALC 116 (H) 06/19/2017   NA 140 07/24/2017   K 3.9 07/24/2017   CL 100 07/24/2017   CREATININE 1.33 (H) 07/24/2017   BUN 19 07/24/2017   CO2 25 07/24/2017   TSH 2.701 06/20/2017   INR 1.06 06/17/2017   HGBA1C 6.1 (H) 06/18/2017     BNP (last 3 results) No results for input(s): BNP in the last 8760 hours.  ProBNP (last 3 results) No results for input(s): PROBNP in the last 8760 hours.   Other Studies Reviewed Today:  Myoview Study Highlights from 07/2017     The left ventricular ejection fraction is moderately decreased (30-44%).  Nuclear stress EF: 36%.  There was no ST segment deviation noted during stress.  Defect 1: There is a small defect of mild severity present in the apex location.  Defect 2: There is a medium defect of moderate severity present in the basal inferior and mid inferior location. There is ischemia in this distribution  This is a high risk study with reduced ejection fraction and areas of infarct and ischemia identified.   Consider cardiac catheterization if stable from stroke perspective. Donato SchultzMark Skains, MD    2d echo 06/20/17 Study Conclusions  - Left ventricle: The cavity size was mildly dilated. Wall thickness was increased in a pattern of mild LVH. Systolic function was moderately to severely reduced. The  estimated ejection fraction was in the range of 30% to 35%. Diffuse hypokinesis. Doppler parameters are consistent with abnormal left ventricular relaxation (grade 1 diastolic dysfunction). - Mitral valve: Calcified annulus. Impressions: - Moderate to severe global reduction in LV systolic function; mild LVH; mild diastolic dysfunction; mild LVE.    Assessment/Plan:  Acute left MCA infarct -Likely embolic -CT head showed small left temporal lobe acute infarct versus beam hardening artifact -CTA showed severe stenosis left M2 segment without emergent large vessel occlusion -MRIbrain showed small left acute MCA involving the insula -Echocardiogram EF of 30-35%, grade 1  diastolic dysfunction -TEE: EF of 3035%, diffuse hypokinesis, no evidence of thrombus or vegetation, no defect or patent foramen ovale identified -Carotid Doppler showed no significant multiple dynamic stenosis of either carotid bifurcation -She is on statin, Plavix, aspirin   She continues to do well. No real residual deficit noted.    New onset Cardiomyopathy -EF noted 30-35% -On ACE/beta blocker. Her BP was very soft at her initial followup - she was symptomatic. Had to cut her HCTZ out and reduced her ACE. She continues to do well clinically. I have left her on her current regimen for now. No changes made today.   Lexiscan now abnormal - cardiac catheterization was recommended. She is felt to be stable from her stroke - The patient understands that risks include but are not limited to stroke (1 in 1000), death (1 in 1000), kidney failure [usually temporary] (1 in 500), bleeding (1 in 200), allergic reaction [possibly serious] (1 in 200), and agrees to proceed. She is anxious to proceed - arranged for Friday with Dr. Swaziland.    Diabetes mellitus, type II -followed by PCP  She is on Metformin - will stop as of now.   Hyperlipidemia - she is on statin therapy.   Essential hypertension - BP  looks ok on current regimen.    Current medicines are reviewed with the patient today.  The patient does not have concerns regarding medicines other than what has been noted above.  The following changes have been made:  See above.  Labs/ tests ordered today include:   No orders of the defined types were placed in this encounter.    Disposition:   FU with me or Dr. Anne Fu in about 2 months  Patient is agreeable to this plan and will call if any problems develop in the interim.   SignedNorma Fredrickson, NP  09/06/2017 2:59 PM  [facility] [address] [place], [place]  [address] Phone: [phone] Fax: [phone]

## 2017-09-06 NOTE — H&P (View-Only) (Signed)
CARDIOLOGY OFFICE NOTE  Date:  09/06/2017    Elizabeth Bowman Date of Birth: 26-Jan-1951 Medical Record #[id number]  PCP:  Ardyth Gal, MD  Cardiologist:  Rick Duff    Chief Complaint  Patient presents with  . Follow-up    Follow up visit after Myoview - seen for Dr. Anne Fu    History of Present Illness: Elizabeth Bowman is a 67 y.o. female who presents today for a follow up visit. Seen for Dr. Rosine Abe.   She has a history of DM, HTN, prior tobacco abuse and HLD.   Admitted in August with stroke - she delayed presenting to the hospital. CT angio head/neck possible acute left temporal infarct, severe stenosis of the L M2 segment, bilateral paraophthalmic wide necked 4 mm aneurysms,moderate stenosis of right cavernous internal carotid artery; MR brain showed small acute L MCA infarct. Outside the window for TPA - no code stroke initiated. As part of her work up echo was obtained showing EF of 30 to 35%. Noted newly diagnosed cerebrovascular aneurysms. She is being treated with aspirin and Plavix. She has had a loop implanted looking for AF.   I saw her back in September for her post hospital visit - BP pretty low - symptomatic even here in the office - had to cut some of her medicines back - changed to just plain ACE and at lower dose - stopped HCTZ. On follow up visit in October - she was doing much better. No symptoms. BP better. Referred on for Lexiscan to make sure that CAD was not playing a role with her cardiomyopathy - see below.   Comes in today. Here with her husband. Need to discuss possible cardiac catheterization. She says she is doing well. No chest pain. Not short of breath. No more "weak" spells. No swelling. She is not doing much around the house - but feels like she could. She feels as if she has recovered well from her stroke back in August. She is on DAPT with aspirin/Plavix.   Past Medical History:  Diagnosis Date  . Complication of anesthesia   .  Diabetes mellitus (HCC)   . Former tobacco use   . HTN (hypertension)   . Hyperlipidemia     History reviewed. No pertinent surgical history.   Medications: Current Meds  Medication Sig  . aspirin 325 MG tablet Take 1 tablet (325 mg total) by mouth daily.  Marland Kitchen atorvastatin (LIPITOR) 80 MG tablet Take 1 tablet (80 mg total) by mouth daily at 6 PM.  . carvedilol (COREG) 3.125 MG tablet Take 2 tablets (6.25 mg total) by mouth 2 (two) times daily with a meal.  . clopidogrel (PLAVIX) 75 MG tablet Take 1 tablet (75 mg total) by mouth daily.  Marland Kitchen lisinopril (PRINIVIL,ZESTRIL) 10 MG tablet Take 1 tablet (10 mg total) by mouth daily.  . metFORMIN (GLUCOPHAGE) 500 MG tablet Take by mouth 2 (two) times daily with a meal.  . pantoprazole (PROTONIX) 40 MG tablet Take 40 mg by mouth daily.     Allergies: No Known Allergies  Social History: The patient  reports that she quit smoking about 10 years ago. She has quit using smokeless tobacco. She reports that she drinks alcohol. She reports that she does not use drugs.   Family History: The patient's family history includes Cancer in her brother and father.   Review of Systems: Please see the history of present illness.   Otherwise, the review of systems is positive for none.  All other systems are reviewed and negative.   Physical Exam: VS:  BP 126/88 (BP Location: Left Arm, Patient Position: Sitting, Cuff Size: Normal)   Pulse 85   Ht 5\' 7"  (1.702 m)   Wt 158 lb 1.9 oz (71.7 kg)   SpO2 98% Comment: at rest  BMI 24.77 kg/m  .  BMI Body mass index is 24.77 kg/m.  Wt Readings from Last 3 Encounters:  09/06/17 158 lb 1.9 oz (71.7 kg)  08/02/17 163 lb 12.8 oz (74.3 kg)  07/24/17 161 lb 12.8 oz (73.4 kg)    General: Pleasant. Well developed, well nourished and in no acute distress. Weight is down a few pounds.   HEENT: Normal.  Neck: Supple, no JVD, carotid bruits, or masses noted.  Cardiac: Regular rate and rhythm. No murmurs, rubs, or  gallops. No edema.  Respiratory:  Lungs are clear to auscultation bilaterally with normal work of breathing.  GI: Soft and nontender.  MS: No deformity or atrophy. Gait and ROM intact.  Skin: Warm and dry. Color is normal.  Neuro:  Strength and sensation are intact and no gross focal deficits noted.  Psych: Alert, appropriate and with normal affect.   LABORATORY DATA:  EKG:  EKG is not ordered today.  Lab Results  Component Value Date   WBC 6.9 07/24/2017   HGB 12.2 07/24/2017   HCT 35.7 07/24/2017   PLT 307 07/24/2017   GLUCOSE 107 (H) 07/24/2017   CHOL 180 06/19/2017   TRIG 82 06/19/2017   HDL 48 06/19/2017   LDLCALC 116 (H) 06/19/2017   NA 140 07/24/2017   K 3.9 07/24/2017   CL 100 07/24/2017   CREATININE 1.33 (H) 07/24/2017   BUN 19 07/24/2017   CO2 25 07/24/2017   TSH 2.701 06/20/2017   INR 1.06 06/17/2017   HGBA1C 6.1 (H) 06/18/2017     BNP (last 3 results) No results for input(s): BNP in the last 8760 hours.  ProBNP (last 3 results) No results for input(s): PROBNP in the last 8760 hours.   Other Studies Reviewed Today:  Myoview Study Highlights from 07/2017     The left ventricular ejection fraction is moderately decreased (30-44%).  Nuclear stress EF: 36%.  There was no ST segment deviation noted during stress.  Defect 1: There is a small defect of mild severity present in the apex location.  Defect 2: There is a medium defect of moderate severity present in the basal inferior and mid inferior location. There is ischemia in this distribution  This is a high risk study with reduced ejection fraction and areas of infarct and ischemia identified.   Consider cardiac catheterization if stable from stroke perspective. Donato SchultzMark Skains, MD    2d echo 06/20/17 Study Conclusions  - Left ventricle: The cavity size was mildly dilated. Wall thickness was increased in a pattern of mild LVH. Systolic function was moderately to severely reduced. The  estimated ejection fraction was in the range of 30% to 35%. Diffuse hypokinesis. Doppler parameters are consistent with abnormal left ventricular relaxation (grade 1 diastolic dysfunction). - Mitral valve: Calcified annulus. Impressions: - Moderate to severe global reduction in LV systolic function; mild LVH; mild diastolic dysfunction; mild LVE.    Assessment/Plan:  Acute left MCA infarct -Likely embolic -CT head showed small left temporal lobe acute infarct versus beam hardening artifact -CTA showed severe stenosis left M2 segment without emergent large vessel occlusion -MRIbrain showed small left acute MCA involving the insula -Echocardiogram EF of 30-35%, grade 1  diastolic dysfunction -TEE: EF of 3035%, diffuse hypokinesis, no evidence of thrombus or vegetation, no defect or patent foramen ovale identified -Carotid Doppler showed no significant multiple dynamic stenosis of either carotid bifurcation -She is on statin, Plavix, aspirin   She continues to do well. No real residual deficit noted.    New onset Cardiomyopathy -EF noted 30-35% -On ACE/beta blocker. Her BP was very soft at her initial followup - she was symptomatic. Had to cut her HCTZ out and reduced her ACE. She continues to do well clinically. I have left her on her current regimen for now. No changes made today.   Lexiscan now abnormal - cardiac catheterization was recommended. She is felt to be stable from her stroke - The patient understands that risks include but are not limited to stroke (1 in 1000), death (1 in 1000), kidney failure [usually temporary] (1 in 500), bleeding (1 in 200), allergic reaction [possibly serious] (1 in 200), and agrees to proceed. She is anxious to proceed - arranged for Friday with Dr. Swaziland.    Diabetes mellitus, type II -followed by PCP  She is on Metformin - will stop as of now.   Hyperlipidemia - she is on statin therapy.   Essential hypertension - BP  looks ok on current regimen.    Current medicines are reviewed with the patient today.  The patient does not have concerns regarding medicines other than what has been noted above.  The following changes have been made:  See above.  Labs/ tests ordered today include:   No orders of the defined types were placed in this encounter.    Disposition:   FU with me or Dr. Anne Fu in about 2 months  Patient is agreeable to this plan and will call if any problems develop in the interim.   SignedNorma Fredrickson, NP  09/06/2017 2:59 PM  [facility] [address] [place], [place]  [address] Phone: [phone] Fax: [phone]

## 2017-09-07 ENCOUNTER — Telehealth: Payer: Self-pay

## 2017-09-07 LAB — CBC
Hematocrit: 35.3 % (ref 34.0–46.6)
Hemoglobin: 12.1 g/dL (ref 11.1–15.9)
MCH: 32 pg (ref 26.6–33.0)
MCHC: 34.3 g/dL (ref 31.5–35.7)
MCV: 93 fL (ref 79–97)
Platelets: 304 10*3/uL (ref 150–379)
RBC: 3.78 x10E6/uL (ref 3.77–5.28)
RDW: 13.8 % (ref 12.3–15.4)
WBC: 7.8 10*3/uL (ref 3.4–10.8)

## 2017-09-07 LAB — BASIC METABOLIC PANEL
BUN/Creatinine Ratio: 13 (ref 12–28)
BUN: 12 mg/dL (ref 8–27)
CO2: 26 mmol/L (ref 20–29)
Calcium: 9.6 mg/dL (ref 8.7–10.3)
Chloride: 101 mmol/L (ref 96–106)
Creatinine, Ser: 0.93 mg/dL (ref 0.57–1.00)
GFR calc Af Amer: 73 mL/min/{1.73_m2} (ref >59)
GFR calc non Af Amer: 63 mL/min/{1.73_m2} (ref >59)
Glucose: 111 mg/dL — ABNORMAL HIGH (ref 65–99)
Potassium: 3.6 mmol/L (ref 3.5–5.2)
Sodium: 144 mmol/L (ref 134–144)

## 2017-09-07 LAB — PT AND PTT
INR: 1 (ref 0.8–1.2)
Prothrombin Time: 10.7 s (ref 9.1–12.0)
aPTT: 28 s (ref 24–33)

## 2017-09-07 NOTE — Telephone Encounter (Signed)
Left generic message on Pt cellphone requesting call back to discuss procedure.  This nurse name and # left for call back.

## 2017-09-07 NOTE — Telephone Encounter (Signed)
New Message ° ° pt verbalized that she is returning call for rn °

## 2017-09-07 NOTE — Telephone Encounter (Signed)
Returned call to Pt.  Patient contacted pre-catheterization at [facility] scheduled for:  09/08/2017@ 1200 Verified arrival time and place:  NT@ 1000 Confirmed AM meds to be taken pre-cath with sip of water: Pt has been hold metformin since seeing L. Gerhardt-notified she will need to hold for 48 hours post cath Take ASA/Plavix Confirmed patient has responsible person to drive home post procedure and observe patient for 24 hours:  yes Addl concerns:  All questions answered

## 2017-09-08 ENCOUNTER — Other Ambulatory Visit: Payer: Self-pay

## 2017-09-08 ENCOUNTER — Ambulatory Visit (HOSPITAL_COMMUNITY)
Admission: RE | Admit: 2017-09-08 | Discharge: 2017-09-08 | Disposition: A | Discharge status: Home / Self Care | Payer: Medicare Other | Source: Ambulatory Visit | Attending: Cardiology | Admitting: Cardiology

## 2017-09-08 ENCOUNTER — Encounter (HOSPITAL_COMMUNITY): Payer: Self-pay

## 2017-09-08 ENCOUNTER — Encounter (HOSPITAL_COMMUNITY)
Admission: RE | Disposition: A | Discharge status: Home / Self Care | Payer: Self-pay | Source: Ambulatory Visit | Attending: Cardiology

## 2017-09-08 DIAGNOSIS — Z7902 Long term (current) use of antithrombotics/antiplatelets: Secondary | ICD-10-CM | POA: Insufficient documentation

## 2017-09-08 DIAGNOSIS — I5022 Chronic systolic (congestive) heart failure: Secondary | ICD-10-CM

## 2017-09-08 DIAGNOSIS — Z79899 Other long term (current) drug therapy: Secondary | ICD-10-CM | POA: Diagnosis not present

## 2017-09-08 DIAGNOSIS — I2510 Atherosclerotic heart disease of native coronary artery without angina pectoris: Secondary | ICD-10-CM | POA: Insufficient documentation

## 2017-09-08 DIAGNOSIS — R9439 Abnormal result of other cardiovascular function study: Secondary | ICD-10-CM | POA: Diagnosis present

## 2017-09-08 DIAGNOSIS — E785 Hyperlipidemia, unspecified: Secondary | ICD-10-CM | POA: Diagnosis not present

## 2017-09-08 DIAGNOSIS — I428 Other cardiomyopathies: Secondary | ICD-10-CM | POA: Diagnosis present

## 2017-09-08 DIAGNOSIS — Z7984 Long term (current) use of oral hypoglycemic drugs: Secondary | ICD-10-CM | POA: Diagnosis not present

## 2017-09-08 DIAGNOSIS — Z7982 Long term (current) use of aspirin: Secondary | ICD-10-CM | POA: Diagnosis not present

## 2017-09-08 DIAGNOSIS — I639 Cerebral infarction, unspecified: Secondary | ICD-10-CM

## 2017-09-08 DIAGNOSIS — I10 Essential (primary) hypertension: Secondary | ICD-10-CM | POA: Diagnosis not present

## 2017-09-08 DIAGNOSIS — E119 Type 2 diabetes mellitus without complications: Secondary | ICD-10-CM

## 2017-09-08 DIAGNOSIS — I420 Dilated cardiomyopathy: Secondary | ICD-10-CM

## 2017-09-08 DIAGNOSIS — Z87891 Personal history of nicotine dependence: Secondary | ICD-10-CM | POA: Diagnosis not present

## 2017-09-08 HISTORY — PX: LEFT HEART CATH AND CORONARY ANGIOGRAPHY: CATH118249

## 2017-09-08 LAB — GLUCOSE, CAPILLARY: GLUCOSE-CAPILLARY: 105 mg/dL — AB (ref 65–99)

## 2017-09-08 SURGERY — LEFT HEART CATH AND CORONARY ANGIOGRAPHY
Anesthesia: LOCAL

## 2017-09-08 MED ORDER — IOPAMIDOL (ISOVUE-370) INJECTION 76%
INTRAVENOUS | Status: AC
  Filled 2017-09-08: qty 100

## 2017-09-08 MED ORDER — VERAPAMIL HCL 2.5 MG/ML IV SOLN
INTRAVENOUS | Status: DC | PRN
  Administered 2017-09-08: 10 mL via INTRA_ARTERIAL

## 2017-09-08 MED ORDER — SODIUM CHLORIDE 0.9 % WEIGHT BASED INFUSION: 1.0000 mL/kg/h | INTRAVENOUS | Status: AC

## 2017-09-08 MED ORDER — SODIUM CHLORIDE 0.9 % IV SOLN
INTRAVENOUS | Status: DC
  Administered 2017-09-08: 11:00:00 via INTRAVENOUS

## 2017-09-08 MED ORDER — SODIUM CHLORIDE 0.9% FLUSH: 3.0000 mL | Freq: Two times a day (BID) | INTRAVENOUS | Status: DC

## 2017-09-08 MED ORDER — DIAZEPAM 5 MG PO TABS: 5.0000 mg | ORAL_TABLET | ORAL | Status: DC

## 2017-09-08 MED ORDER — FENTANYL CITRATE (PF) 100 MCG/2ML IJ SOLN
INTRAMUSCULAR | Status: DC | PRN
  Administered 2017-09-08: 25 ug via INTRAVENOUS

## 2017-09-08 MED ORDER — FENTANYL CITRATE (PF) 100 MCG/2ML IJ SOLN
INTRAMUSCULAR | Status: AC
  Filled 2017-09-08: qty 2

## 2017-09-08 MED ORDER — METFORMIN HCL 500 MG PO TABS: 500.0000 mg | ORAL_TABLET | Freq: Two times a day (BID) | ORAL | Status: DC

## 2017-09-08 MED ORDER — LIDOCAINE HCL (PF) 1 % IJ SOLN
INTRAMUSCULAR | Status: AC
  Filled 2017-09-08: qty 30

## 2017-09-08 MED ORDER — SODIUM CHLORIDE 0.9 % IV SOLN: 250.0000 mL | INTRAVENOUS | Status: DC | PRN

## 2017-09-08 MED ORDER — SODIUM CHLORIDE 0.9% FLUSH: 3.0000 mL | INTRAVENOUS | Status: DC | PRN

## 2017-09-08 MED ORDER — LIDOCAINE HCL (PF) 1 % IJ SOLN
INTRAMUSCULAR | Status: DC | PRN
  Administered 2017-09-08: 2 mL

## 2017-09-08 MED ORDER — MIDAZOLAM HCL 2 MG/2ML IJ SOLN
INTRAMUSCULAR | Status: DC | PRN
  Administered 2017-09-08: 1 mg via INTRAVENOUS

## 2017-09-08 MED ORDER — VERAPAMIL HCL 2.5 MG/ML IV SOLN
INTRAVENOUS | Status: AC
  Filled 2017-09-08: qty 2

## 2017-09-08 MED ORDER — MIDAZOLAM HCL 2 MG/2ML IJ SOLN
INTRAMUSCULAR | Status: AC
  Filled 2017-09-08: qty 2

## 2017-09-08 MED ORDER — IOPAMIDOL (ISOVUE-370) INJECTION 76%
INTRAVENOUS | Status: DC | PRN
  Administered 2017-09-08: 70 mL via INTRA_ARTERIAL

## 2017-09-08 MED ORDER — ASPIRIN 81 MG PO CHEW: 81.0000 mg | CHEWABLE_TABLET | ORAL | Status: DC

## 2017-09-08 MED ORDER — HEPARIN SODIUM (PORCINE) 1000 UNIT/ML IJ SOLN
INTRAMUSCULAR | Status: AC
  Filled 2017-09-08: qty 1

## 2017-09-08 MED ORDER — HEPARIN (PORCINE) IN NACL 2-0.9 UNIT/ML-% IJ SOLN
INTRAMUSCULAR | Status: AC
  Filled 2017-09-08: qty 1000

## 2017-09-08 MED ORDER — HEPARIN (PORCINE) IN NACL 2-0.9 UNIT/ML-% IJ SOLN
INTRAMUSCULAR | Status: AC | PRN
  Administered 2017-09-08: 1000 mL

## 2017-09-08 MED ORDER — HEPARIN SODIUM (PORCINE) 1000 UNIT/ML IJ SOLN
INTRAMUSCULAR | Status: DC | PRN
  Administered 2017-09-08: 3500 [IU] via INTRAVENOUS

## 2017-09-08 SURGICAL SUPPLY — 13 items
CATH INFINITI 5 FR JL3.5 (CATHETERS) ×2 IMPLANT
CATH INFINITI 5FR ANG PIGTAIL (CATHETERS) ×2 IMPLANT
CATH INFINITI JR4 5F (CATHETERS) ×2 IMPLANT
DEVICE RAD COMP TR BAND LRG (VASCULAR PRODUCTS) ×2 IMPLANT
GLIDESHEATH SLEND SS 6F .021 (SHEATH) ×2 IMPLANT
GUIDEWIRE INQWIRE 1.5J.035X260 (WIRE) ×1 IMPLANT
INQWIRE 1.5J .035X260CM (WIRE) ×2
KIT HEART LEFT (KITS) ×2 IMPLANT
PACK CARDIAC CATHETERIZATION (CUSTOM PROCEDURE TRAY) ×2 IMPLANT
SYR MEDRAD MARK V 150ML (SYRINGE) ×2 IMPLANT
TRANSDUCER W/STOPCOCK (MISCELLANEOUS) ×2 IMPLANT
TUBING CIL FLEX 10 FLL-RA (TUBING) ×2 IMPLANT
WIRE HI TORQ VERSACORE-J 145CM (WIRE) ×2 IMPLANT

## 2017-09-08 NOTE — Discharge Instructions (Signed)

## 2017-09-08 NOTE — Interval H&P Note (Signed)
History and Physical Interval Note:  09/08/2017 12:09 PM  Elizabeth Bowman  has presented today for surgery, with the diagnosis of abnormal mioview  The various methods of treatment have been discussed with the patient and family. After consideration of risks, benefits and other options for treatment, the patient has consented to  Procedure(s): LEFT HEART CATH AND CORONARY ANGIOGRAPHY (N/A) as a surgical intervention .  The patient's history has been reviewed, patient examined, no change in status, stable for surgery.  I have reviewed the patient's chart and labs.  Questions were answered to the patient's satisfaction.   Cath Lab Visit (complete for each Cath Lab visit)  Clinical Evaluation Leading to the Procedure:   ACS: No.  Non-ACS:    Anginal Classification: No Symptoms  Anti-ischemic medical therapy: Minimal Therapy (1 class of medications)  Non-Invasive Test Results: High-risk stress test findings: cardiac mortality >3%/year  Prior CABG: No previous CABG        Theron Arista [facility] 09/08/2017 12:09 PM

## 2017-09-11 ENCOUNTER — Encounter (HOSPITAL_COMMUNITY): Payer: Self-pay | Admitting: Cardiology

## 2017-09-19 ENCOUNTER — Ambulatory Visit (INDEPENDENT_AMBULATORY_CARE_PROVIDER_SITE_OTHER): Payer: Medicare Other | Admitting: *Deleted

## 2017-09-19 DIAGNOSIS — I639 Cerebral infarction, unspecified: Secondary | ICD-10-CM | POA: Diagnosis not present

## 2017-09-20 NOTE — Progress Notes (Signed)
Carelink Summary Report / Loop Recorder 

## 2017-09-26 ENCOUNTER — Ambulatory Visit: Payer: Medicare Other | Admitting: Nurse Practitioner

## 2017-10-05 LAB — CUP PACEART REMOTE DEVICE CHECK
Date Time Interrogation Session: 20181120190855
MDC IDC PG IMPLANT DT: 20180822

## 2017-10-19 ENCOUNTER — Ambulatory Visit (INDEPENDENT_AMBULATORY_CARE_PROVIDER_SITE_OTHER): Payer: Medicare Other | Admitting: *Deleted

## 2017-10-19 DIAGNOSIS — I639 Cerebral infarction, unspecified: Secondary | ICD-10-CM | POA: Diagnosis not present

## 2017-10-20 NOTE — Progress Notes (Signed)
Carelink Summary Report / Loop Recorder 

## 2017-10-23 ENCOUNTER — Ambulatory Visit: Payer: Medicare Other | Admitting: Nurse Practitioner

## 2017-10-23 NOTE — Progress Notes (Deleted)
CARDIOLOGY OFFICE NOTE  Date:  10/23/2017    Elizabeth Bowman Date of Birth: [birth year] Medical Record #[id number]  PCP:  Ardyth Gal, MD  Cardiologist:  Tyrone Sage & ***    No chief complaint on file.   History of Present Illness: Elizabeth Bowman is a 66 y.o. female who presents today for a ***  Seen for Dr. Rosine Abe.   She has a history of DM, HTN, prior tobacco abuse and HLD.   Admittedin Augustwith stroke - she delayed presenting to the hospital. CT angio head/neck possible acute left temporal infarct, severe stenosis of the L M2 segment, bilateral paraophthalmic wide necked 4 mm aneurysms,moderate stenosis of right cavernous internal carotid artery; MR brain showed small acute L MCA infarct. Outside the window for TPA - no code stroke initiated. As part of her work up echo was obtained showing EF of 30 to 35%. Noted newly diagnosed cerebrovascular aneurysms. She is being treated with aspirin and Plavix. She has had a loop implanted looking for AF.  I saw her back in September for her post hospital visit - BP pretty low - symptomatic even here in the office - had to cut some of her medicines back - changed to just plain ACE and at lower dose - stopped HCTZ.On follow up visit in October - she was doing much better. No symptoms. BP better. Referred on for Lexiscan to make sure that CAD was not playing a role with her cardiomyopathy - see below.   Comes in today. Here with her husband.Need to discuss possible cardiac catheterization. She says she is doing well. No chest pain. Not short of breath. No more "weak" spells. No swelling. She is not doing much around the house - but feels like she could. She feels as if she has recovered well from her stroke back in August. She is on DAPT with aspirin/Plavix.   Comes in today. Here with   Past Medical History:  Diagnosis Date  . Complication of anesthesia   . Diabetes mellitus (HCC)   . Former tobacco use   . HTN  (hypertension)   . Hyperlipidemia     Past Surgical History:  Procedure Laterality Date  . LEFT HEART CATH AND CORONARY ANGIOGRAPHY N/A 09/08/2017   Procedure: LEFT HEART CATH AND CORONARY ANGIOGRAPHY;  Surgeon: Swaziland, Peter M, MD;  Location: [facility] INVASIVE CV LAB;  Service: Cardiovascular;  Laterality: N/A;  . LOOP RECORDER INSERTION N/A 06/21/2017   Procedure: LOOP RECORDER INSERTION;  Surgeon: Marinus Maw, MD;  Location: [facility] INVASIVE CV LAB;  Service: Cardiovascular;  Laterality: N/A;  . TEE WITHOUT CARDIOVERSION N/A 06/21/2017   Procedure: TRANSESOPHAGEAL ECHOCARDIOGRAM (TEE);  Surgeon: Lars Masson, MD;  [facility];  Service: Cardiovascular;  Laterality: N/A;     Medications: No outpatient medications have been marked as taking for the 10/23/17 encounter (Appointment) with Rosalio Macadamia, NP.     Allergies: No Known Allergies  Social History: The patient  reports that she quit smoking about 10 years ago. She has quit using smokeless tobacco. She reports that she drinks alcohol. She reports that she does not use drugs.   Family History: The patient's ***family history includes Cancer in her brother and father.   Review of Systems: Please see the history of present illness.   Otherwise, the review of systems is positive for {NONE DEFAULTED:18576::"none"}.   All other systems are reviewed and negative.   Physical Exam: VS:  There were no vitals taken  for this visit. Marland Kitchen.  BMI There is no height or weight on file to calculate BMI.  Wt Readings from Last 3 Encounters:  09/08/17 157 lb (71.2 kg)  09/06/17 158 lb 1.9 oz (71.7 kg)  08/02/17 163 lb 12.8 oz (74.3 kg)    General: Pleasant. Well developed, well nourished and in no acute distress.   HEENT: Normal.  Neck: Supple, no JVD, carotid bruits, or masses noted.  Cardiac: ***Regular rate and rhythm. No murmurs, rubs, or gallops. No edema.  Respiratory:  Lungs are clear to auscultation bilaterally with normal  work of breathing.  GI: Soft and nontender.  MS: No deformity or atrophy. Gait and ROM intact.  Skin: Warm and dry. Color is normal.  Neuro:  Strength and sensation are intact and no gross focal deficits noted.  Psych: Alert, appropriate and with normal affect.   LABORATORY DATA:  EKG:  EKG {ACTION; IS/IS ZOX:[id number]}OT:[id number]} ordered today. This demonstrates ***.  Lab Results  Component Value Date   WBC 7.8 09/06/2017   HGB 12.1 09/06/2017   HCT 35.3 09/06/2017   PLT 304 09/06/2017   GLUCOSE 111 (H) 09/06/2017   CHOL 180 06/19/2017   TRIG 82 06/19/2017   HDL 48 06/19/2017   LDLCALC 116 (H) 06/19/2017   NA 144 09/06/2017   K 3.6 09/06/2017   CL 101 09/06/2017   CREATININE 0.93 09/06/2017   BUN 12 09/06/2017   CO2 26 09/06/2017   TSH 2.701 06/20/2017   INR 1.0 09/06/2017   HGBA1C 6.1 (H) 06/18/2017     BNP (last 3 results) No results for input(s): BNP in the last 8760 hours.  ProBNP (last 3 results) No results for input(s): PROBNP in the last 8760 hours.   Other Studies Reviewed Today:  Myoview Study Highlights from 07/2017     The left ventricular ejection fraction is moderately decreased (30-44%).  Nuclear stress EF: 36%.  There was no ST segment deviation noted during stress.  Defect 1: There is a small defect of mild severity present in the apex location.  Defect 2: There is a medium defect of moderate severity present in the basal inferior and mid inferior location. There is ischemia in this distribution  This is a high risk study with reduced ejection fraction and areas of infarct and ischemia identified.  Consider cardiac catheterization if stable from stroke perspective. Donato SchultzMark Skains, MD    2d echo 06/20/17 Study Conclusions  - Left ventricle: The cavity size was mildly dilated. Wall thickness was increased in a pattern of mild LVH. Systolic function was moderately to severely reduced. The estimated ejection fraction was in the range of  30% to 35%. Diffuse hypokinesis. Doppler parameters are consistent with abnormal left ventricular relaxation (grade 1 diastolic dysfunction). - Mitral valve: Calcified annulus. Impressions: - Moderate to severe global reduction in LV systolic function; mild LVH; mild diastolic dysfunction; mild LVE.    Assessment/Plan:  Acute left MCA infarct -Likely embolic -CT head showed small left temporal lobe acute infarct versus beam hardening artifact -CTA showed severe stenosis left M2 segment without emergent large vessel occlusion -MRIbrain showed small left acute MCA involving the insula -Echocardiogram EF of 30-35%, grade 1 diastolic dysfunction -TEE: EF of 3035%, diffuse hypokinesis, no evidence of thrombus or vegetation, no defect or patent foramen ovale identified -Carotid Doppler showed no significant multiple dynamic stenosis of either carotid bifurcation -She is on statin, Plavix, aspirin   She continues to do well. No real residual deficit noted.    New  onset Cardiomyopathy -EF noted 30-35% -On ACE/beta blocker. Her BPwas very soft at her initial followup - shewas symptomatic.Had to cut her HCTZ out and reduced her ACE. She continues to do well clinically. I have left her on her current regimen for now. No changes made today.   Lexiscan now abnormal - cardiac catheterization was recommended. She is felt to be stable from her stroke - The patient understands that risks include but are not limited to stroke (1 in 1000), death (1 in 1000), kidney failure [usually temporary] (1 in 500), bleeding (1 in 200), allergic reaction [possibly serious] (1 in 200), and agrees to proceed. She is anxious to proceed - arranged for Friday with Dr. Swaziland.    Diabetes mellitus, type II -followed by PCP  She is on Metformin - will stop as of now.   Hyperlipidemia - she is on statin therapy.   Essential hypertension -BP looks ok on current regimen.    Current  medicines are reviewed with the patient today.  The patient does not have concerns regarding medicines other than what has been noted above.  The following changes have been made:  See above.  Labs/ tests ordered today include:   No orders of the defined types were placed in this encounter.    Disposition:   FU with *** in {gen number 1-65:537482} {Days to years:10300}.   Patient is agreeable to this plan and will call if any problems develop in the interim.   SignedNorma Fredrickson, NP  10/23/2017 7:55 AM  [facility] [address] [place], [place]  [address] Phone: [phone] Fax: [phone]

## 2017-11-02 LAB — CUP PACEART REMOTE DEVICE CHECK
Implantable Pulse Generator Implant Date: 20180822
MDC IDC SESS DTM: 20181220193620

## 2017-11-09 ENCOUNTER — Encounter: Payer: Self-pay | Admitting: Cardiology

## 2017-11-09 ENCOUNTER — Ambulatory Visit (INDEPENDENT_AMBULATORY_CARE_PROVIDER_SITE_OTHER): Payer: Medicare Other | Admitting: Cardiology

## 2017-11-09 VITALS — BP 120/70 | HR 82 | Ht 67.0 in | Wt 160.8 lb

## 2017-11-09 DIAGNOSIS — I639 Cerebral infarction, unspecified: Secondary | ICD-10-CM | POA: Diagnosis not present

## 2017-11-09 DIAGNOSIS — I5022 Chronic systolic (congestive) heart failure: Secondary | ICD-10-CM

## 2017-11-09 MED ORDER — CARVEDILOL 6.25 MG PO TABS: 6.2500 mg | ORAL_TABLET | Freq: Two times a day (BID) | ORAL | 3 refills | Status: DC

## 2017-11-09 NOTE — Patient Instructions (Signed)
Medication Instructions:  Please increase Carvedilol to 6.25 mg twice a day. Continue all other medications as listed.  Follow-Up: Follow up in 3 months with Norma Fredrickson, NP.  Follow up in 6 months with Dr. Anne Fu.  You will receive a letter in the mail 2 months before you are due.  Please call us when you receive this letter to schedule your follow up appointment.  If you need a refill on your cardiac medications before your next appointment, please call your pharmacy.  Thank you for choosing [facility]!!

## 2017-11-09 NOTE — Progress Notes (Signed)
Cardiology Office Note:    Date:  11/09/2017   ID:  Elizabeth Bowman, DOB [birth year], MRN [id number]  PCP:  Elizabeth Gal, MD  Cardiologist:  No primary care provider on file.   Referring MD: Elizabeth Gal, MD     History of Present Illness:    Elizabeth Bowman is a 67 y.o. female with minor nonobstructive coronary artery disease, cardiomyopathy dilated 30% EF, aneurysms noted bilateral paraophthalmic wide necked 4 mm aneurysms, aspirin Plavix, loop recorder looking for A. fib, she underwent cardiac catheterization as below.  Continue with medical management.  Fatigue is main complaint.  Encouraged exercise. No SOB, no orthopnea, no chest pain.  Past Medical History:  Diagnosis Date  . Complication of anesthesia   . Diabetes mellitus (HCC)   . Former tobacco use   . HTN (hypertension)   . Hyperlipidemia     Past Surgical History:  Procedure Laterality Date  . LEFT HEART CATH AND CORONARY ANGIOGRAPHY N/A 09/08/2017   Procedure: LEFT HEART CATH AND CORONARY ANGIOGRAPHY;  Surgeon: Swaziland, Peter M, MD;  Location: [facility] INVASIVE CV LAB;  Service: Cardiovascular;  Laterality: N/A;  . LOOP RECORDER INSERTION N/A 06/21/2017   Procedure: LOOP RECORDER INSERTION;  Surgeon: Marinus Maw, MD;  Location: [facility] INVASIVE CV LAB;  Service: Cardiovascular;  Laterality: N/A;  . TEE WITHOUT CARDIOVERSION N/A 06/21/2017   Procedure: TRANSESOPHAGEAL ECHOCARDIOGRAM (TEE);  Surgeon: Lars Masson, MD;  Location: [facility] ENDOSCOPY;  Service: Cardiovascular;  Laterality: N/A;    Current Medications: Current Meds  Medication Sig  . aspirin EC 81 MG tablet Take 81 mg daily by mouth.  Marland Kitchen atorvastatin (LIPITOR) 80 MG tablet Take 1 tablet (80 mg total) by mouth daily at 6 PM.  . carvedilol (COREG) 3.125 MG tablet Take 2 tablets (6.25 mg total) by mouth 2 (two) times daily with a meal.  . clopidogrel (PLAVIX) 75 MG tablet Take 1 tablet (75 mg total) by mouth daily.  Marland Kitchen lisinopril (PRINIVIL,ZESTRIL) 10 MG tablet Take 1  tablet (10 mg total) by mouth daily.  . metFORMIN (GLUCOPHAGE) 500 MG tablet Take 1 tablet (500 mg total) 2 (two) times daily with a meal by mouth.  . pantoprazole (PROTONIX) 40 MG tablet Take 40 mg by mouth daily.     Allergies:   Patient has no known allergies.   Social History   Socioeconomic History  . Marital status: Married    Spouse name: None  . Number of children: None  . Years of education: None  . Highest education level: None  Social Needs  . Financial resource strain: None  . Food insecurity - worry: None  . Food insecurity - inability: None  . Transportation needs - medical: None  . Transportation needs - non-medical: None  Occupational History  . None  Tobacco Use  . Smoking status: Former Smoker    Last attempt to quit: 06/19/2007    Years since quitting: 10.4  . Smokeless tobacco: Former Neurosurgeon  . Tobacco comment: Smoked for 20-30 years  Substance and Sexual Activity  . Alcohol use: Yes    Comment: occasional drink a few times a month, not regularly  . Drug use: No  . Sexual activity: Yes    Partners: Female  Other Topics Concern  . None  Social History Narrative  . None     Family History: The patient's family history includes Cancer in her brother and father. There is no history of CAD, Heart attack, or Congestive Heart Failure.  ROS:  Please see the history of present illness.     All other systems reviewed and are negative.  EKGs/Labs/Other Studies Reviewed:    The following studies were reviewed today:  Cath 09/08/17  Ost LAD to Dist LAD lesion is 20% stenosed.  Ost Cx to Prox Cx lesion is 10% stenosed with 10% stenosed side branch in Ost 1st Mrg.  There is severe left ventricular systolic dysfunction.  LV end diastolic pressure is normal.  The left ventricular ejection fraction is 25-35% by visual estimate.   1. Minor nonobstructive CAD 2. Severe LV dysfunction 3. Normal LVEDP  Plan: medical management.  ECHO 06/19/17 - Left  ventricle: The cavity size was mildly dilated. Wall   thickness was increased in a pattern of mild LVH. Systolic   function was moderately to severely reduced. The estimated   ejection fraction was in the range of 30% to 35%. Diffuse   hypokinesis. Doppler parameters are consistent with abnormal left   ventricular relaxation (grade 1 diastolic dysfunction). - Mitral valve: Calcified annulus.  Impressions:  - Moderate to severe global reduction in LV systolic function; mild   LVH; mild diastolic dysfunction; mild LVE.  EKG:  EKG is not ordered today.    Recent Labs: 06/20/2017: TSH 2.701 06/21/2017: Magnesium 2.0 09/06/2017: BUN 12; Creatinine, Ser 0.93; Hemoglobin 12.1; Platelets 304; Potassium 3.6; Sodium 144  Recent Lipid Panel    Component Value Date/Time   CHOL 180 06/19/2017 0310   TRIG 82 06/19/2017 0310   HDL 48 06/19/2017 0310   CHOLHDL 3.8 06/19/2017 0310   VLDL 16 06/19/2017 0310   LDLCALC 116 (H) 06/19/2017 0310    Physical Exam:    VS:  BP 120/70   Pulse 82   Ht 5\' 7"  (1.702 m)   Wt 160 lb 12.8 oz (72.9 kg)   SpO2 98%   BMI 25.18 kg/m     Wt Readings from Last 3 Encounters:  11/09/17 160 lb 12.8 oz (72.9 kg)  09/08/17 157 lb (71.2 kg)  09/06/17 158 lb 1.9 oz (71.7 kg)     GEN:  Well nourished, well developed in no acute distress HEENT: Normal NECK: No JVD; No carotid bruits LYMPHATICS: No lymphadenopathy CARDIAC: RRR, no murmurs, rubs, gallops RESPIRATORY:  Clear to auscultation without rales, wheezing or rhonchi  ABDOMEN: Soft, non-tender, non-distended MUSCULOSKELETAL:  No edema; No deformity  SKIN: Warm and dry NEUROLOGIC:  Alert and oriented x 3 PSYCHIATRIC:  Normal affect   ASSESSMENT:    No diagnosis found. PLAN:    In order of problems listed above:  Dilated cardiomyopathy, nonischemic --We will go ahead and increase her carvedilol from 3.125-6.25 twice a day.  She has had low blood pressure in the past.  We will do this carefully.   Continue with lisinopril 10.  No diuretic at this time.  Fatigue -Continue to advocate movement, exercise.  Loop recorder -This was placed during setting of stroke.  No current evidence of atrial fibrillation.  We will have her come back in in 3 months to see Elizabeth Bowman, 6 months to see me   Medication Adjustments/Labs and Tests Ordered: Current medicines are reviewed at length with the patient today.  Concerns regarding medicines are outlined above.  No orders of the defined types were placed in this encounter.  No orders of the defined types were placed in this encounter.   Signed, Donato Schultz, MD  11/09/2017 3:58 PM    [facility]

## 2017-11-14 ENCOUNTER — Ambulatory Visit: Payer: Medicare Other | Admitting: Cardiology

## 2017-11-20 ENCOUNTER — Ambulatory Visit (INDEPENDENT_AMBULATORY_CARE_PROVIDER_SITE_OTHER): Payer: Medicare Other | Admitting: *Deleted

## 2017-11-20 DIAGNOSIS — I639 Cerebral infarction, unspecified: Secondary | ICD-10-CM

## 2017-11-20 NOTE — Progress Notes (Signed)
Carelink Summary Report / Loop Recorder 

## 2017-11-28 LAB — CUP PACEART REMOTE DEVICE CHECK
Implantable Pulse Generator Implant Date: 20180822
MDC IDC SESS DTM: 20190119203922

## 2017-12-20 ENCOUNTER — Ambulatory Visit (INDEPENDENT_AMBULATORY_CARE_PROVIDER_SITE_OTHER): Payer: Medicare Other | Admitting: *Deleted

## 2017-12-20 DIAGNOSIS — I639 Cerebral infarction, unspecified: Secondary | ICD-10-CM | POA: Diagnosis not present

## 2017-12-20 NOTE — Progress Notes (Signed)
Carelink Summary Report / Loop Recorder 

## 2018-01-22 ENCOUNTER — Ambulatory Visit (INDEPENDENT_AMBULATORY_CARE_PROVIDER_SITE_OTHER): Payer: Medicare Other | Admitting: *Deleted

## 2018-01-22 DIAGNOSIS — I639 Cerebral infarction, unspecified: Secondary | ICD-10-CM

## 2018-01-22 LAB — CUP PACEART REMOTE DEVICE CHECK
Implantable Pulse Generator Implant Date: 20180822
MDC IDC SESS DTM: 20190219202014

## 2018-01-22 NOTE — Progress Notes (Signed)
Carelink Summary Report / Loop Recorder 

## 2018-02-05 ENCOUNTER — Ambulatory Visit: Payer: Medicare Other | Admitting: Nurse Practitioner

## 2018-02-05 NOTE — Progress Notes (Deleted)
CARDIOLOGY OFFICE NOTE  Date:  02/05/2018    Elizabeth Bowman Date of Birth: 11/25/1950 Medical Record #[id number]  PCP:  Ardyth Gal, MD  Cardiologist:  Tyrone Sage & ***    No chief complaint on file.   History of Present Illness: Elizabeth Bowman is a 67 y.o. female who presents today for a ***   Comes in today. Here with   Past Medical History:  Diagnosis Date  . Complication of anesthesia   . Diabetes mellitus (HCC)   . Former tobacco use   . HTN (hypertension)   . Hyperlipidemia     Past Surgical History:  Procedure Laterality Date  . LEFT HEART CATH AND CORONARY ANGIOGRAPHY N/A 09/08/2017   Procedure: LEFT HEART CATH AND CORONARY ANGIOGRAPHY;  Surgeon: Swaziland, Peter M, MD;  Location: [facility] INVASIVE CV LAB;  Service: Cardiovascular;  Laterality: N/A;  . LOOP RECORDER INSERTION N/A 06/21/2017   Procedure: LOOP RECORDER INSERTION;  Surgeon: Marinus Maw, MD;  Location: [facility] INVASIVE CV LAB;  Service: Cardiovascular;  Laterality: N/A;  . TEE WITHOUT CARDIOVERSION N/A 06/21/2017   Procedure: TRANSESOPHAGEAL ECHOCARDIOGRAM (TEE);  Surgeon: Lars Masson, MD;  Location: [facility] ENDOSCOPY;  Service: Cardiovascular;  Laterality: N/A;     Medications: No outpatient medications have been marked as taking for the 02/05/18 encounter (Appointment) with Rosalio Macadamia, NP.     Allergies: No Known Allergies  Social History: The patient  reports that she quit smoking about 10 years ago. She has quit using smokeless tobacco. She reports that she drinks alcohol. She reports that she does not use drugs.   Family History: The patient's ***family history includes Cancer in her brother and father.   Review of Systems: Please see the history of present illness.   Otherwise, the review of systems is positive for {NONE DEFAULTED:18576::"none"}.   All other systems are reviewed and negative.   Physical Exam: VS:  There were no vitals taken for this visit. Marland Kitchen  BMI There is no height or  weight on file to calculate BMI.  Wt Readings from Last 3 Encounters:  11/09/17 160 lb 12.8 oz (72.9 kg)  09/08/17 157 lb (71.2 kg)  09/06/17 158 lb 1.9 oz (71.7 kg)    General: Pleasant. Well developed, well nourished and in no acute distress.   HEENT: Normal.  Neck: Supple, no JVD, carotid bruits, or masses noted.  Cardiac: ***Regular rate and rhythm. No murmurs, rubs, or gallops. No edema.  Respiratory:  Lungs are clear to auscultation bilaterally with normal work of breathing.  GI: Soft and nontender.  MS: No deformity or atrophy. Gait and ROM intact.  Skin: Warm and dry. Color is normal.  Neuro:  Strength and sensation are intact and no gross focal deficits noted.  Psych: Alert, appropriate and with normal affect.   LABORATORY DATA:  EKG:  EKG {ACTION; IS/IS [id number]} ordered today. This demonstrates ***.  Lab Results  Component Value Date   WBC 7.8 09/06/2017   HGB 12.1 09/06/2017   HCT 35.3 09/06/2017   PLT 304 09/06/2017   GLUCOSE 111 (H) 09/06/2017   CHOL 180 06/19/2017   TRIG 82 06/19/2017   HDL 48 06/19/2017   LDLCALC 116 (H) 06/19/2017   NA 144 09/06/2017   K 3.6 09/06/2017   CL 101 09/06/2017   CREATININE 0.93 09/06/2017   BUN 12 09/06/2017   CO2 26 09/06/2017   TSH 2.701 06/20/2017   INR 1.0 09/06/2017   HGBA1C 6.1 (H) 06/18/2017  BNP (last 3 results) No results for input(s): BNP in the last 8760 hours.  ProBNP (last 3 results) No results for input(s): PROBNP in the last 8760 hours.   Other Studies Reviewed Today:   Assessment/Plan:   Current medicines are reviewed with the patient today.  The patient does not have concerns regarding medicines other than what has been noted above.  The following changes have been made:  See above.  Labs/ tests ordered today include:   No orders of the defined types were placed in this encounter.    Disposition:   FU with *** in {gen number 9-56:213086} {Days to years:10300}.   Patient is  agreeable to this plan and will call if any problems develop in the interim.   SignedNorma Fredrickson, NP  02/05/2018 7:42 AM  [facility] [address] [place], [place]  [address] Phone: [phone] Fax: [phone]

## 2018-02-23 ENCOUNTER — Ambulatory Visit (INDEPENDENT_AMBULATORY_CARE_PROVIDER_SITE_OTHER): Payer: Medicare Other | Admitting: *Deleted

## 2018-02-23 DIAGNOSIS — I639 Cerebral infarction, unspecified: Secondary | ICD-10-CM

## 2018-02-23 NOTE — Progress Notes (Signed)
Carelink Summary Report / Loop Recorder 

## 2018-03-03 LAB — CUP PACEART REMOTE DEVICE CHECK
Date Time Interrogation Session: 20190324194012
MDC IDC PG IMPLANT DT: 20180822

## 2018-03-06 NOTE — Progress Notes (Signed)
CARDIOLOGY OFFICE NOTE  Date:  03/07/2018    Elizabeth Bowman Date of Birth: Nov 19, 1950 Medical Record #[id number]  PCP:  Ardyth Gal, MD  Cardiologist:  Rick Duff    Chief Complaint  Patient presents with  . Congestive Heart Failure  . Cardiomyopathy    4 month check - seen for Dr. Anne Fu    History of Present Illness: Elizabeth Bowman is a 67 y.o. female who presents today for a 4 month check. Seen for Dr. Anne Fu.   She has a history of minor nonobstructive coronary artery disease, NICM with 30% EF, wide neced 4 mm laterally directed RIGHT parophthalmic aneurysm. She has a loop in place due to prior crytogenic stroke. She is on Plavix.   Seen back in January - some fatigue but otherwise felt to be doing ok.   Comes in today. Here with her husband. She says she is doing ok. Admits she does not get enough activity. May have some degree of depression. She admits she should be doing more. No chest pain. Breathing is ok. No swelling. Sugars are good. She had lab in [facility] about a month ago - looked good - did not have her lipids done. Overall, has no real concern.   Past Medical History:  Diagnosis Date  . Complication of anesthesia   . Diabetes mellitus (HCC)   . Former tobacco use   . HTN (hypertension)   . Hyperlipidemia     Past Surgical History:  Procedure Laterality Date  . LEFT HEART CATH AND CORONARY ANGIOGRAPHY N/A 09/08/2017   Procedure: LEFT HEART CATH AND CORONARY ANGIOGRAPHY;  Surgeon: Swaziland, Peter M, MD;  Location: [facility] INVASIVE CV LAB;  Service: Cardiovascular;  Laterality: N/A;  . LOOP RECORDER INSERTION N/A 06/21/2017   Procedure: LOOP RECORDER INSERTION;  Surgeon: Marinus Maw, MD;  Location: [facility] INVASIVE CV LAB;  Service: Cardiovascular;  Laterality: N/A;  . TEE WITHOUT CARDIOVERSION N/A 06/21/2017   Procedure: TRANSESOPHAGEAL ECHOCARDIOGRAM (TEE);  Surgeon: Lars Masson, MD;  Location: [facility] ENDOSCOPY;  Service: Cardiovascular;   Laterality: N/A;     Medications: Current Meds  Medication Sig  . aspirin EC 81 MG tablet Take 81 mg daily by mouth.  Marland Kitchen atorvastatin (LIPITOR) 80 MG tablet Take 1 tablet (80 mg total) by mouth daily at 6 PM.  . carvedilol (COREG) 6.25 MG tablet Take 1 tablet (6.25 mg total) by mouth 2 (two) times daily with a meal.  . clopidogrel (PLAVIX) 75 MG tablet Take 1 tablet (75 mg total) by mouth daily.  Marland Kitchen lisinopril (PRINIVIL,ZESTRIL) 10 MG tablet Take 1 tablet (10 mg total) by mouth daily.  . metFORMIN (GLUCOPHAGE) 500 MG tablet Take 1 tablet (500 mg total) 2 (two) times daily with a meal by mouth.  . pantoprazole (PROTONIX) 40 MG tablet Take 40 mg by mouth daily as needed (heartburn).      Allergies: No Known Allergies  Social History: The patient  reports that she quit smoking about 10 years ago. She has quit using smokeless tobacco. She reports that she drinks alcohol. She reports that she does not use drugs.   Family History: The patient's family history includes Cancer in her brother and father.   Review of Systems: Please see the history of present illness.   Otherwise, the review of systems is positive for none.   All other systems are reviewed and negative.   Physical Exam: VS:  BP 118/62 (BP Location: Left Arm, Patient Position: Sitting,  Cuff Size: Normal)   Pulse 67   Ht 5\' 7"  (1.702 m)   Wt 161 lb 12.8 oz (73.4 kg)   SpO2 96% Comment: at rest  BMI 25.34 kg/m  .  BMI Body mass index is 25.34 kg/m.  Wt Readings from Last 3 Encounters:  03/07/18 161 lb 12.8 oz (73.4 kg)  11/09/17 160 lb 12.8 oz (72.9 kg)  09/08/17 157 lb (71.2 kg)    General: Pleasant. Well developed, well nourished and in no acute distress.   HEENT: Normal.  Neck: Supple, no JVD, carotid bruits, or masses noted.  Cardiac: Regular rate and rhythm. No murmurs, rubs, or gallops. No edema.  Respiratory:  Lungs are clear to auscultation bilaterally with normal work of breathing.  GI: Soft and nontender.   MS: No deformity or atrophy. Gait and ROM intact.  Skin: Warm and dry. Color is normal.  Neuro:  Strength and sensation are intact and no gross focal deficits noted.  Psych: Alert, appropriate and with normal affect.   LABORATORY DATA:  EKG:  EKG is not ordered today.  Lab Results  Component Value Date   WBC 7.8 09/06/2017   HGB 12.1 09/06/2017   HCT 35.3 09/06/2017   PLT 304 09/06/2017   GLUCOSE 111 (H) 09/06/2017   CHOL 180 06/19/2017   TRIG 82 06/19/2017   HDL 48 06/19/2017   LDLCALC 116 (H) 06/19/2017   NA 144 09/06/2017   K 3.6 09/06/2017   CL 101 09/06/2017   CREATININE 0.93 09/06/2017   BUN 12 09/06/2017   CO2 26 09/06/2017   TSH 2.701 06/20/2017   INR 1.0 09/06/2017   HGBA1C 6.1 (H) 06/18/2017     BNP (last 3 results) No results for input(s): BNP in the last 8760 hours.  ProBNP (last 3 results) No results for input(s): PROBNP in the last 8760 hours.   Other Studies Reviewed Today:  Cath 09/08/17  Ost LAD to [facility] LAD lesion is 20% stenosed.  Ost Cx to Prox Cx lesion is 10% stenosed with 10% stenosed side branch in Ost 1st Mrg.  There is severe left ventricular systolic dysfunction.  LV end diastolic pressure is normal.  The left ventricular ejection fraction is 25-35% by visual estimate.  1. Minor nonobstructive CAD 2. Severe LV dysfunction 3. Normal LVEDP  Plan: medical management.  ECHO 06/19/17 - Left ventricle: The cavity size was mildly dilated. Wall thickness was increased in a pattern of mild LVH. Systolic function was moderately to severely reduced. The estimated ejection fraction was in the range of 30% to 35%. Diffuse hypokinesis. Doppler parameters are consistent with abnormal left ventricular relaxation (grade 1 diastolic dysfunction). - Mitral valve: Calcified annulus.  Impressions:  - Moderate to severe global reduction in LV systolic function; mild LVH; mild diastolic dysfunction; mild  LVE.    Assessment/Plan:  1. NICM - On fair regimen. BP soft - would hold on further titration. She endorses fatigue - I think this is more depression related but could be from Coreg. For now, no changes. She is doing well clinically.   2. Underlying loop recorder - no AF noted.   3. Chronic fatigue - see #1  4. Prior stroke - doing well - no residual. Remains on Plavix.   5. Abnormal CTA of the head/neck - discussed with Dr. Anne Fu - not sure what follow up she will need - will send a note to Dr. Pearlean Brownie asking him to review and his recommendations. She does not follow up with neurology.  6. HLD - she has had recent labs - no lipids - will check today.   Current medicines are reviewed with the patient today.  The patient does not have concerns regarding medicines other than what has been noted above.  The following changes have been made:  See above.  Labs/ tests ordered today include:   No orders of the defined types were placed in this encounter.   Disposition:   FU with Dr. Anne Fu as planned in July.    Patient is agreeable to this plan and will call if any problems develop in the interim.   SignedNorma Fredrickson, NP  03/07/2018 10:35 AM  [facility] [address] [place], [place]  [address] Phone: [phone] Fax: [phone]

## 2018-03-07 ENCOUNTER — Ambulatory Visit (INDEPENDENT_AMBULATORY_CARE_PROVIDER_SITE_OTHER): Payer: Medicare Other | Admitting: Nurse Practitioner

## 2018-03-07 ENCOUNTER — Encounter: Payer: Self-pay | Admitting: Nurse Practitioner

## 2018-03-07 VITALS — BP 118/62 | HR 67 | Ht 67.0 in | Wt 161.8 lb

## 2018-03-07 DIAGNOSIS — I5022 Chronic systolic (congestive) heart failure: Secondary | ICD-10-CM

## 2018-03-07 DIAGNOSIS — E785 Hyperlipidemia, unspecified: Secondary | ICD-10-CM

## 2018-03-07 LAB — LIPID PANEL
Chol/HDL Ratio: 3.3 ratio (ref 0.0–4.4)
Cholesterol, Total: 160 mg/dL (ref 100–199)
HDL: 48 mg/dL (ref >39)
LDL Calculated: 95 mg/dL (ref 0–99)
Triglycerides: 84 mg/dL (ref 0–149)
VLDL Cholesterol Cal: 17 mg/dL (ref 5–40)

## 2018-03-07 NOTE — Patient Instructions (Addendum)
We will be checking the following labs today - Lipids   Medication Instructions:    Continue with your current medicines.     Testing/Procedures To Be Arranged:  N/A  Follow-Up:   See Dr. Anne Fu as planned in July    Other Special Instructions:   I will send a note to the stroke doctor to see if you need anymore scans on your head/neck    If you need a refill on your cardiac medications before your next appointment, please call your pharmacy.   Call the [facility] office at [phone] if you have any questions, problems or concerns.

## 2018-03-19 ENCOUNTER — Other Ambulatory Visit: Payer: Self-pay | Admitting: *Deleted

## 2018-03-19 ENCOUNTER — Telehealth: Payer: Self-pay | Admitting: *Deleted

## 2018-03-19 DIAGNOSIS — I729 Aneurysm of unspecified site: Secondary | ICD-10-CM

## 2018-03-19 NOTE — Telephone Encounter (Signed)
-----   Message from Rosalio Macadamia, NP sent at 03/19/2018  8:48 AM EDT ----- Duwayne Heck,  I have talked to Dr. Anne Fu - we would like to get Ms. Sliney referred back to neurology - Dr. Pearlean Brownie to discuss the aneurysm and to see what/if any further testing/follow up is indicated.   Lawson Fiscal ----- Message ----- From: Rosalio Macadamia, NP Sent: 03/07/2018  10:44 AM To: Micki Riley, MD, Rosalio Macadamia, NP  Hi Dr. Pearlean Brownie  Dr. Anne Fu and I were wondering if you would review the CTA of the head/neck on Ms. Sax. She was admitted last August with stroke.   She is doing well.   Of concern was the "Wide necked 4 mm laterally directed RIGHT paraophthalmic aneurysm".   Is this something that needs further follow up? Thanks so much for your help.  Lawson Fiscal

## 2018-03-19 NOTE — Telephone Encounter (Signed)
S/w pt is aware of Dr.Skain's recommendation's.  Referral placed in system for neurology, stroke/aneurysm.

## 2018-03-20 LAB — CUP PACEART REMOTE DEVICE CHECK
Date Time Interrogation Session: 20190426193618
MDC IDC PG IMPLANT DT: 20180822

## 2018-03-28 ENCOUNTER — Ambulatory Visit (INDEPENDENT_AMBULATORY_CARE_PROVIDER_SITE_OTHER): Payer: Medicare Other | Admitting: *Deleted

## 2018-03-28 DIAGNOSIS — I5022 Chronic systolic (congestive) heart failure: Secondary | ICD-10-CM

## 2018-03-30 NOTE — Progress Notes (Signed)
Carelink Summary Report / Loop Recorder 

## 2018-04-23 LAB — CUP PACEART REMOTE DEVICE CHECK
Date Time Interrogation Session: 20190529193932
MDC IDC PG IMPLANT DT: 20180822

## 2018-05-01 ENCOUNTER — Ambulatory Visit (INDEPENDENT_AMBULATORY_CARE_PROVIDER_SITE_OTHER): Payer: Medicare Other | Admitting: *Deleted

## 2018-05-01 DIAGNOSIS — I639 Cerebral infarction, unspecified: Secondary | ICD-10-CM | POA: Diagnosis not present

## 2018-05-01 NOTE — Progress Notes (Signed)
Carelink Summary Report / Loop Recorder 

## 2018-05-15 ENCOUNTER — Ambulatory Visit: Payer: Medicare Other | Admitting: Cardiology

## 2018-05-15 ENCOUNTER — Encounter: Payer: Self-pay | Admitting: Cardiology

## 2018-05-15 VITALS — BP 158/72 | HR 73 | Ht 67.0 in | Wt 158.0 lb

## 2018-05-15 DIAGNOSIS — Z79899 Other long term (current) drug therapy: Secondary | ICD-10-CM | POA: Diagnosis not present

## 2018-05-15 DIAGNOSIS — I5022 Chronic systolic (congestive) heart failure: Secondary | ICD-10-CM | POA: Diagnosis not present

## 2018-05-15 DIAGNOSIS — I639 Cerebral infarction, unspecified: Secondary | ICD-10-CM | POA: Diagnosis not present

## 2018-05-15 DIAGNOSIS — E785 Hyperlipidemia, unspecified: Secondary | ICD-10-CM | POA: Diagnosis not present

## 2018-05-15 MED ORDER — SACUBITRIL-VALSARTAN 24-26 MG PO TABS: 1.0000 | ORAL_TABLET | Freq: Two times a day (BID) | ORAL | 6 refills | Status: DC

## 2018-05-15 NOTE — Progress Notes (Signed)
Cardiology Office Note:    Date:  05/15/2018   ID:  Elizabeth Bowman, DOB 09/03/1951, MRN [id number]  PCP:  Ardyth Gal, MD  Cardiologist:  No primary care provider on file.   Referring MD: Ardyth Gal, MD     History of Present Illness:    Elizabeth Bowman is a 67 y.o. female with minor nonobstructive coronary artery disease, cardiomyopathy dilated 30% EF, aneurysms noted bilateral paraophthalmic wide necked 4 mm aneurysms, aspirin Plavix, loop recorder looking for A. fib, she underwent cardiac catheterization as below.  Continue with medical management.  05/15/2018-overall doing quite well.  No significant shortness of breath orthopnea PND syncope bleeding or chest pain.  She is compliant with her medications.  No side effects of lisinopril.  EF 30 to 35%.  No atrial fibrillation on loop recorder.  Catheterization with minimal CAD.  Starting Sherryll Burger  Past Medical History:  Diagnosis Date  . Complication of anesthesia   . Diabetes mellitus (HCC)   . Former tobacco use   . HTN (hypertension)   . Hyperlipidemia     Past Surgical History:  Procedure Laterality Date  . LEFT HEART CATH AND CORONARY ANGIOGRAPHY N/A 09/08/2017   Procedure: LEFT HEART CATH AND CORONARY ANGIOGRAPHY;  Surgeon: [place]azil[place], Peter M, MD;  Location: [facility] INVASIVE CV LAB;  Service: Cardiovascular;  Laterality: N/A;  . LOOP RECORDER INSERTION N/A 06/21/2017   Procedure: LOOP RECORDER INSERTION;  Surgeon: Marinus Maw, MD;  Location: [facility] INVASIVE CV LAB;  Service: Cardiovascular;  Laterality: N/A;  . TEE WITHOUT CARDIOVERSION N/A 06/21/2017   Procedure: TRANSESOPHAGEAL ECHOCARDIOGRAM (TEE);  Surgeon: Lars Masson, MD;  Location: [facility] ENDOSCOPY;  Service: Cardiovascular;  Laterality: N/A;    Current Medications: Current Meds  Medication Sig  . aspirin EC 81 MG tablet Take 81 mg daily by mouth.  Marland Kitchen atorvastatin (LIPITOR) 80 MG tablet Take 1 tablet (80 mg total) by mouth daily at 6 PM.  . carvedilol (COREG) 6.25 MG  tablet Take 1 tablet (6.25 mg total) by mouth 2 (two) times daily with a meal.  . clopidogrel (PLAVIX) 75 MG tablet Take 1 tablet (75 mg total) by mouth daily.  . pantoprazole (PROTONIX) 40 MG tablet Take 40 mg by mouth daily as needed (heartburn).      Allergies:   Patient has no known allergies.   Social History   Socioeconomic History  . Marital status: Married    Spouse name: Not on file  . Number of children: Not on file  . Years of education: Not on file  . Highest education level: Not on file  Occupational History  . Not on file  Social Needs  . Financial resource strain: Not on file  . Food insecurity:    Worry: Not on file    Inability: Not on file  . Transportation needs:    Medical: Not on file    Non-medical: Not on file  Tobacco Use  . Smoking status: Former Smoker    Last attempt to quit: 06/19/2007    Years since quitting: 10.9  . Smokeless tobacco: Former Neurosurgeon  . Tobacco comment: Smoked for 20-30 years  Substance and Sexual Activity  . Alcohol use: Yes    Comment: occasional drink a few times a month, not regularly  . Drug use: No  . Sexual activity: Yes    Partners: Female  Lifestyle  . Physical activity:    Days per week: Not on file    Minutes per session: Not on  file  . Stress: Not on file  Relationships  . Social connections:    Talks on phone: Not on file    Gets together: Not on file    Attends religious service: Not on file    Active member of club or organization: Not on file    Attends meetings of clubs or organizations: Not on file    Relationship status: Not on file  Other Topics Concern  . Not on file  Social History Narrative  . Not on file     Family History: The patient's family history includes Cancer in her brother and father. There is no history of CAD, Heart attack, or Congestive Heart Failure.  ROS:   Please see the history of present illness.    All other review of systems negative.  EKGs/Labs/Other Studies Reviewed:     The following studies were reviewed today:  Cath 09/08/17  Ost LAD to Dist LAD lesion is 20% stenosed.  Ost Cx to Prox Cx lesion is 10% stenosed with 10% stenosed side branch in Ost 1st Mrg.  There is severe left ventricular systolic dysfunction.  LV end diastolic pressure is normal.  The left ventricular ejection fraction is 25-35% by visual estimate.   1. Minor nonobstructive CAD 2. Severe LV dysfunction 3. Normal LVEDP  Plan: medical management.  ECHO 06/19/17 - Left ventricle: The cavity size was mildly dilated. Wall   thickness was increased in a pattern of mild LVH. Systolic   function was moderately to severely reduced. The estimated   ejection fraction was in the range of 30% to 35%. Diffuse   hypokinesis. Doppler parameters are consistent with abnormal left   ventricular relaxation (grade 1 diastolic dysfunction). - Mitral valve: Calcified annulus.  Impressions:  - Moderate to severe global reduction in LV systolic function; mild   LVH; mild diastolic dysfunction; mild LVE.  EKG:  EKG is not ordered today.    Recent Labs: 06/20/2017: TSH 2.701 06/21/2017: Magnesium 2.0 09/06/2017: BUN 12; Creatinine, Ser 0.93; Hemoglobin 12.1; Platelets 304; Potassium 3.6; Sodium 144  Recent Lipid Panel    Component Value Date/Time   CHOL 160 03/07/2018 1041   TRIG 84 03/07/2018 1041   HDL 48 03/07/2018 1041   CHOLHDL 3.3 03/07/2018 1041   CHOLHDL 3.8 06/19/2017 0310   VLDL 16 06/19/2017 0310   LDLCALC 95 03/07/2018 1041    Physical Exam:    VS:  BP (!) 158/72   Pulse 73   Ht 5\' 7"  (1.702 m)   Wt 158 lb (71.7 kg)   SpO2 96%   BMI 24.75 kg/m     Wt Readings from Last 3 Encounters:  05/15/18 158 lb (71.7 kg)  03/07/18 161 lb 12.8 oz (73.4 kg)  11/09/17 160 lb 12.8 oz (72.9 kg)     GEN: Well nourished, well developed, in no acute distress  HEENT: normal  Neck: no JVD, carotid bruits, or masses Cardiac: RRR; no murmurs, rubs, or gallops,no edema    Respiratory:  clear to auscultation bilaterally, normal work of breathing GI: soft, nontender, nondistended, + BS MS: no deformity or atrophy  Skin: warm and dry, no rash Neuro:  Alert and Oriented x 3, Strength and sensation are intact Psych: euthymic mood, full affect   ASSESSMENT:    1. Chronic systolic heart failure (HCC)   2. Long-term use of high-risk medication   3. Hyperlipidemia, unspecified hyperlipidemia type   4. Cryptogenic stroke (HCC)    PLAN:    In order of  problems listed above:  Dilated cardiomyopathy, nonischemic -She continues to do quite well symptom wise.  EF 30 to 35%. -Carvedilol 6.25 twice a day.  We will go ahead and start Entresto 24/26 twice daily.  She knows to hold lisinopril 10 mg for the next 36 hours prior to initiation.  We will have a basic metabolic profile in 1 week.  We will have her follow-up.    No diuretic at this time.  Fatigue -Continue to advocate movement, exercise.  No recent change in issues.  Loop recorder -This was placed during setting of stroke.  No current evidence of atrial fibrillation.  This remains stable.  History of stroke -Continues Plavix 75 monotherapy.  We will have her come back in in 2- 3 months to see Lawson Fiscal, 12 months to see me   Medication Adjustments/Labs and Tests Ordered: Current medicines are reviewed at length with the patient today.  Concerns regarding medicines are outlined above.  Orders Placed This Encounter  Procedures  . Basic Metabolic Panel (BMET)   Meds ordered this encounter  Medications  . sacubitril-valsartan (ENTRESTO) 24-26 MG    Sig: Take 1 tablet by mouth 2 (two) times daily.    Dispense:  60 tablet    Refill:  6    Signed, Donato Schultz, MD  05/15/2018 1:29 PM    [facility]

## 2018-05-15 NOTE — Patient Instructions (Addendum)
Medication Instructions:  Stop Lisinopril, must be off a full 36 hours before starting [employer] taking Entresto 24/26 MG 1 Tablet, by mouth, two times a day, WAIT TO START UNTIL LISINOPRIL IS STOPPED FOR 36 HRS  Labwork: BMET: In 1 week, in office  Follow-Up: 2 Month follow up with Haydee Monica, NP   1 Year follow up with Dr. Anne Fu.   If you need a refill on your cardiac medications before your next appointment, please call your pharmacy.

## 2018-06-04 ENCOUNTER — Ambulatory Visit (INDEPENDENT_AMBULATORY_CARE_PROVIDER_SITE_OTHER): Payer: Medicare Other | Admitting: *Deleted

## 2018-06-04 DIAGNOSIS — I639 Cerebral infarction, unspecified: Secondary | ICD-10-CM

## 2018-06-05 NOTE — Progress Notes (Signed)
Carelink Summary Report / Loop Recorder 

## 2018-06-07 LAB — CUP PACEART REMOTE DEVICE CHECK
Date Time Interrogation Session: 20190701201207
MDC IDC PG IMPLANT DT: 20180822

## 2018-06-11 ENCOUNTER — Other Ambulatory Visit: Payer: Medicare Other | Admitting: *Deleted

## 2018-06-11 DIAGNOSIS — Z79899 Other long term (current) drug therapy: Secondary | ICD-10-CM

## 2018-06-11 DIAGNOSIS — I5022 Chronic systolic (congestive) heart failure: Secondary | ICD-10-CM

## 2018-06-12 LAB — BASIC METABOLIC PANEL
BUN/Creatinine Ratio: 13 (ref 12–28)
BUN: 10 mg/dL (ref 8–27)
CALCIUM: 10.1 mg/dL (ref 8.7–10.3)
CHLORIDE: 101 mmol/L (ref 96–106)
CO2: 29 mmol/L (ref 20–29)
CREATININE: 0.77 mg/dL (ref 0.57–1.00)
GFR calc non Af Amer: 79 mL/min/{1.73_m2} (ref >59)
GFR, EST AFRICAN AMERICAN: 91 mL/min/{1.73_m2} (ref >59)
GLUCOSE: 99 mg/dL (ref 65–99)
Potassium: 3.6 mmol/L (ref 3.5–5.2)
Sodium: 143 mmol/L (ref 134–144)

## 2018-06-13 ENCOUNTER — Ambulatory Visit: Payer: Medicare Other | Admitting: Neurology

## 2018-06-13 ENCOUNTER — Encounter: Payer: Self-pay | Admitting: Neurology

## 2018-06-13 VITALS — BP 107/67 | HR 72 | Ht 67.0 in | Wt 158.6 lb

## 2018-06-13 DIAGNOSIS — I669 Occlusion and stenosis of unspecified cerebral artery: Secondary | ICD-10-CM

## 2018-06-13 DIAGNOSIS — I639 Cerebral infarction, unspecified: Secondary | ICD-10-CM

## 2018-06-13 NOTE — Progress Notes (Signed)
[facility] [address] [place]. [place] [address] [phone]       OFFICE FOLLOW-UP NOTE  [name] Date of Birth:  11/13/1950 Medical Record Number:  [id number]   HPI: [name] is a pleasant 67 year old lady seen today for initial office follow-up visit following hospital admission for stroke in August 2018.  History is obtained from her and personal review of electronic medical records as well as imaging films. Elizabeth Bowman is an 67 y.o. female with a past medical history significant for diabetes and hypertension presented to [facility] ED with complaints of AMS, confusion and aphasia. Her husband stated that she woke around 0930 this morning without any deficit. They ate breakfast in bed, and when she got out of bed to bring the plates to the kitchen, her husband said that she stumbled and fell against the wall. She continued to the kitchen and when she returned, she was slurring her words and complaining about a terrible headache. Mr. Alverson suggested he bring her to the hospital, but she initially refused medical care. She got back into bed and only spoke a few, nearly incomprehensible words to him before going back to sleep.She presented outside time window for intervention.CTA head/neck 06/17/17: Severe stenosis LEFT M2 segment without emergent LVO. Atherosclerosis with moderate stenosis RIGHT cavernous ICA.Bilateral paraophthalmic wide necked 4 mm aneurysms. 3. Atherosclerosis with moderate stenosis RIGHT cavernous internal carotid artery. CTA NECK: 1. Atherosclerosis without hemodynamically significant stenosis or acute vascular process. MRI Brain showed Small acute left MCA infarct involving the insula. TTE showed Systolic function was moderately to severely reduced. The estimated ejection fraction was in the range of 30% to 35%. Diffuse hypokinesis. TEE showed no clot or thrombus.Loop recorder was inserted on 06/21/2017 and so far PAF has not been found.HbA1c was  6.1 and LDL cholesterol was 116 mg%.She was started on aspirin and plavix and has done well without any recurrent symptoms. For unclear reasons she did not follow up until today. She states her BP is good and today is 106/67 and she is tolerating lipitor well without side effects and last lipid check 1 week ago was good.She is active and eats health and has lost some weight.Her speech is nearly normal and occasional word substitutions when tired only.  ROS:   14 system review of systems is positive for no complaints today.  And all other systems negative  PMH:  Past Medical History:  Diagnosis Date  . Complication of anesthesia   . Diabetes mellitus (HCC)   . Former tobacco use   . HTN (hypertension)   . Hyperlipidemia   . Stroke [facility])     Social History:  Social History   Socioeconomic History  . Marital status: Married    Spouse name: Not on file  . Number of children: Not on file  . Years of education: Not on file  . Highest education level: Not on file  Occupational History  . Not on file  Social Needs  . Financial resource strain: Not on file  . Food insecurity:    Worry: Not on file    Inability: Not on file  . Transportation needs:    Medical: Not on file    Non-medical: Not on file  Tobacco Use  . Smoking status: Former Smoker    Last attempt to quit: 06/19/2007    Years since quitting: 10.9  . Smokeless tobacco: Former Neurosurgeon  . Tobacco comment: Smoked for 20-30 years  Substance and Sexual Activity  .  Alcohol use: Yes    Comment: occasional drink a few times a month, not regularly  . Drug use: No  . Sexual activity: Yes    Partners: Female  Lifestyle  . Physical activity:    Days per week: Not on file    Minutes per session: Not on file  . Stress: Not on file  Relationships  . Social connections:    Talks on phone: Not on file    Gets together: Not on file    Attends religious service: Not on file    Active member of club or organization: Not on file     Attends meetings of clubs or organizations: Not on file    Relationship status: Not on file  . Intimate partner violence:    Fear of current or ex partner: Not on file    Emotionally abused: Not on file    Physically abused: Not on file    Forced sexual activity: Not on file  Other Topics Concern  . Not on file  Social History Narrative  . Not on file    Medications:   Current Outpatient Medications on File Prior to Visit  Medication Sig Dispense Refill  . atorvastatin (LIPITOR) 80 MG tablet Take 1 tablet (80 mg total) by mouth daily at 6 PM. 30 tablet 0  . carvedilol (COREG) 6.25 MG tablet Take 1 tablet (6.25 mg total) by mouth 2 (two) times daily with a meal. 180 tablet 3  . clopidogrel (PLAVIX) 75 MG tablet Take 1 tablet (75 mg total) by mouth daily. 90 tablet 3  . metFORMIN (GLUCOPHAGE) 500 MG tablet TAKE 1 TABLET BY MOUTH ONCE DAILY WITH BREAKFAST    . pantoprazole (PROTONIX) 40 MG tablet Take 40 mg by mouth daily as needed (heartburn).     . sacubitril-valsartan (ENTRESTO) 24-26 MG Take 1 tablet by mouth 2 (two) times daily. 60 tablet 6   No current facility-administered medications on file prior to visit.     Allergies:  No Known Allergies  Physical Exam General: well developed, well nourished middle-aged lady, seated, in no evident distress Head: head normocephalic and atraumatic.  Neck: supple with no carotid or supraclavicular bruits Cardiovascular: regular rate and rhythm, no murmurs Musculoskeletal: no deformity Skin:  no rash/petichiae Vascular:  Normal pulses all extremities Vitals:   06/13/18 1131  BP: 107/67  Pulse: 72   Neurologic Exam Mental Status: Awake and fully alert. Oriented to place and time. Recent and remote memory intact. Attention span, concentration and fund of knowledge appropriate. Mood and affect appropriate.  Fluent speech with only occasional paraphasias.  Good repetition and comprehension and naming Cranial Nerves: Fundoscopic exam  reveals sharp disc margins. Pupils equal, briskly reactive to light. Extraocular movements full without nystagmus. Visual fields full to confrontation. Hearing intact. Facial sensation intact. Face, tongue, palate moves normally and symmetrically.  Motor: Normal bulk and tone. Normal strength in all tested extremity muscles. Sensory.: intact to touch ,pinprick .position and vibratory sensation.  Coordination: Rapid alternating movements normal in all extremities. Finger-to-nose and heel-to-shin performed accurately bilaterally. Gait and Station: Arises from chair without difficulty. Stance is normal. Gait demonstrates normal stride length and balance . Able to heel, toe and tandem walk without difficulty.  Reflexes: 1+ and symmetric. Toes downgoing.   NIHSS  1 Modified Rankin  1   ASSESSMENT: 54 year patient with left MCA branch infarct in August 2018 secondary to symptomatic high-grade left MCA stenosis versus cryptogenic with partially recanalized hrombus.  Multiple vascular  risk factors of diabetes, hypertension, hyperlipidemia, cardiomyopathy and intracranial stenosis.  She has done quite well without any significant deficits.    PLAN: I had a long d/w patient about her remote stroke, left MCA stenosis, risk for recurrent stroke/TIAs, personally independently reviewed imaging studies and stroke evaluation results and answered questions.Continue  Plavix 75 mg daily alone and discontinue aspirin for secondary stroke prevention and maintain strict control of hypertension with blood pressure goal below 130/90, diabetes with hemoglobin A1c goal below 6.5% and lipids with LDL cholesterol goal below 70 mg/dL. I also advised the patient to eat a healthy diet with plenty of whole grains, cereals, fruits and vegetables, exercise regularly and maintain ideal body weight . Check follow-up carotid and transcranial Doppler studies to follow her MCA stenosis. Followup in the future with my nurse practitioner  Shanda Bumps in 6 months or call earlier if necessary. Greater than 50% of time during this 25 minute visit was spent on counseling,explanation of diagnosis, planning of further management, discussion with patient and family and coordination of care Delia Heady, MD  [facility] [address] [place], [place] [address]  Phone [phone] Fax [phone] Note: This document was prepared with digital dictation and possible smart phrase technology. Any transcriptional errors that result from this process are unintentional

## 2018-06-13 NOTE — Patient Instructions (Signed)
I had a long d/w patient about her remote stroke, left MCA stenosis, risk for recurrent stroke/TIAs, personally independently reviewed imaging studies and stroke evaluation results and answered questions.Continue  Plavix 75 mg daily alone and discontinue aspirin for secondary stroke prevention and maintain strict control of hypertension with blood pressure goal below 130/90, diabetes with hemoglobin A1c goal below 6.5% and lipids with LDL cholesterol goal below 70 mg/dL. I also advised the patient to eat a healthy diet with plenty of whole grains, cereals, fruits and vegetables, exercise regularly and maintain ideal body weight . Check follow-up carotid and transcranial Doppler studies to follow her MCA stenosis. Followup in the future with my nurse practitioner Shanda Bumps in 6 months or call earlier if necessary.   Stroke Prevention Some medical conditions and behaviors are associated with a higher chance of having a stroke. You can help prevent a stroke by making nutrition, lifestyle, and other changes, including managing any medical conditions you may have. What nutrition changes can be made?  Eat healthy foods. You can do this by: ? Choosing foods high in fiber, such as fresh fruits and vegetables and whole grains. ? Eating at least 5 or more servings of fruits and vegetables a day. Try to fill half of your plate at each meal with fruits and vegetables. ? Choosing lean protein foods, such as lean cuts of meat, poultry without skin, fish, tofu, beans, and nuts. ? Eating low-fat dairy products. ? Avoiding foods that are high in salt (sodium). This can help lower blood pressure. ? Avoiding foods that have saturated fat, trans fat, and cholesterol. This can help prevent high cholesterol. ? Avoiding processed and premade foods.  Follow your health care provider's specific guidelines for losing weight, controlling high blood pressure (hypertension), lowering high cholesterol, and managing diabetes. These  may include: ? Reducing your daily calorie intake. ? Limiting your daily sodium intake to 1,500 milligrams (mg). ? Using only healthy fats for cooking, such as olive oil, canola oil, or sunflower oil. ? Counting your daily carbohydrate intake. What lifestyle changes can be made?  Maintain a healthy weight. Talk to your health care provider about your ideal weight.  Get at least 30 minutes of moderate physical activity at least 5 days a week. Moderate activity includes brisk walking, biking, and swimming.  Do not use any products that contain nicotine or tobacco, such as cigarettes and e-cigarettes. If you need help quitting, ask your health care provider. It may also be helpful to avoid exposure to secondhand smoke.  Limit alcohol intake to no more than 1 drink a day for nonpregnant women and 2 drinks a day for men. One drink equals 12 oz of beer, 5 oz of wine, or 1 oz of hard liquor.  Stop any illegal drug use.  Avoid taking birth control pills. Talk to your health care provider about the risks of taking birth control pills if: ? You are over 70 years old. ? You smoke. ? You get migraines. ? You have ever had a blood clot. What other changes can be made?  Manage your cholesterol levels. ? Eating a healthy diet is important for preventing high cholesterol. If cholesterol cannot be managed through diet alone, you may also need to take medicines. ? Take any prescribed medicines to control your cholesterol as told by your health care provider.  Manage your diabetes. ? Eating a healthy diet and exercising regularly are important parts of managing your blood sugar. If your blood sugar cannot be managed  through diet and exercise, you may need to take medicines. ? Take any prescribed medicines to control your diabetes as told by your health care provider.  Control your hypertension. ? To reduce your risk of stroke, try to keep your blood pressure below 130/80. ? Eating a healthy diet and  exercising regularly are an important part of controlling your blood pressure. If your blood pressure cannot be managed through diet and exercise, you may need to take medicines. ? Take any prescribed medicines to control hypertension as told by your health care provider. ? Ask your health care provider if you should monitor your blood pressure at home. ? Have your blood pressure checked every year, even if your blood pressure is normal. Blood pressure increases with age and some medical conditions.  Get evaluated for sleep disorders (sleep apnea). Talk to your health care provider about getting a sleep evaluation if you snore a lot or have excessive sleepiness.  Take over-the-counter and prescription medicines only as told by your health care provider. Aspirin or blood thinners (antiplatelets or anticoagulants) may be recommended to reduce your risk of forming blood clots that can lead to stroke.  Make sure that any other medical conditions you have, such as atrial fibrillation or atherosclerosis, are managed. What are the warning signs of a stroke? The warning signs of a stroke can be easily remembered as BEFAST.  B is for balance. Signs include: ? Dizziness. ? Loss of balance or coordination. ? Sudden trouble walking.  E is for eyes. Signs include: ? A sudden change in vision. ? Trouble seeing.  F is for face. Signs include: ? Sudden weakness or numbness of the face. ? The face or eyelid drooping to one side.  A is for arms. Signs include: ? Sudden weakness or numbness of the arm, usually on one side of the body.  S is for speech. Signs include: ? Trouble speaking (aphasia). ? Trouble understanding.  T is for time. ? These symptoms may represent a serious problem that is an emergency. Do not wait to see if the symptoms will go away. Get medical help right away. Call your local emergency services (911 in the [place] Do not drive yourself to the hospital.  Other signs of stroke  may include: ? A sudden, severe headache with no known cause. ? Nausea or vomiting. ? Seizure.  Where to find more information: For more information, visit:  American Stroke Association: www.strokeassociation.org  National Stroke Association: www.stroke.org  Summary  You can prevent a stroke by eating healthy, exercising, not smoking, limiting alcohol intake, and managing any medical conditions you may have.  Do not use any products that contain nicotine or tobacco, such as cigarettes and e-cigarettes. If you need help quitting, ask your health care provider. It may also be helpful to avoid exposure to secondhand smoke.  Remember BEFAST for warning signs of stroke. Get help right away if you or a loved one has any of these signs. This information is not intended to replace advice given to you by your health care provider. Make sure you discuss any questions you have with your health care provider. Document Released: 11/24/2004 Document Revised: 11/22/2016 Document Reviewed: 11/22/2016 Elsevier Interactive Patient Education  [employer].

## 2018-06-19 ENCOUNTER — Telehealth: Payer: Self-pay | Admitting: Cardiology

## 2018-06-19 NOTE — Telephone Encounter (Signed)
Spoke w/ pt and requested that she send a manual transmission b/c her home monitor has not updated in at least 14 days.   

## 2018-06-20 ENCOUNTER — Ambulatory Visit (HOSPITAL_COMMUNITY)
Admission: RE | Admit: 2018-06-20 | Discharge: 2018-06-20 | Disposition: A | Discharge status: Home / Self Care | Payer: Medicare Other | Source: Ambulatory Visit | Attending: Neurology | Admitting: Neurology

## 2018-06-20 ENCOUNTER — Encounter (HOSPITAL_COMMUNITY): Payer: Medicare Other

## 2018-06-20 ENCOUNTER — Ambulatory Visit (HOSPITAL_BASED_OUTPATIENT_CLINIC_OR_DEPARTMENT_OTHER)
Admission: RE | Admit: 2018-06-20 | Discharge: 2018-06-20 | Disposition: A | Discharge status: Home / Self Care | Payer: Medicare Other | Source: Ambulatory Visit | Attending: Neurology | Admitting: Neurology

## 2018-06-20 DIAGNOSIS — I669 Occlusion and stenosis of unspecified cerebral artery: Secondary | ICD-10-CM

## 2018-06-20 DIAGNOSIS — I6523 Occlusion and stenosis of bilateral carotid arteries: Secondary | ICD-10-CM | POA: Insufficient documentation

## 2018-06-27 ENCOUNTER — Encounter (HOSPITAL_COMMUNITY): Payer: Medicare Other

## 2018-07-03 ENCOUNTER — Telehealth: Payer: Self-pay

## 2018-07-03 NOTE — Telephone Encounter (Signed)
Notes recorded by Hildred Alamin, RN on 07/03/2018 at 10:10 AM EDT RN call patient on home phone. Rn was told to call her cell number. RN call patient that her transcranial doppler was normal. PT verbalized understanding. ------

## 2018-07-03 NOTE — Telephone Encounter (Signed)
RN call patient that her vas carotid doppler shows no significant carotid stenosis in the neck. Pt verbalized understanding. ------

## 2018-07-03 NOTE — Telephone Encounter (Signed)
-----   Message from Micki Riley, MD sent at 06/29/2018 11:52 AM EDT ----- Joneen Roach inform the patient had carotid ultrasound study shows no significant bilateral  lcarotid stenosis in the neck

## 2018-07-05 ENCOUNTER — Ambulatory Visit (INDEPENDENT_AMBULATORY_CARE_PROVIDER_SITE_OTHER): Payer: Medicare Other | Admitting: *Deleted

## 2018-07-05 ENCOUNTER — Telehealth: Payer: Self-pay | Admitting: Nurse Practitioner

## 2018-07-05 DIAGNOSIS — I639 Cerebral infarction, unspecified: Secondary | ICD-10-CM | POA: Diagnosis not present

## 2018-07-05 NOTE — Telephone Encounter (Incomplete)
New Message ° ° ° ° ° ° ° ° ° ° °*STAT* If patient is at the pharmacy, call can be transferred to refill team. ° ° °1. Which medications need to be refilled? (please list name of each medication and dose if known)  ° °2. Which pharmacy/location (including street and city if local pharmacy) is medication to be sent to? ° °3. Do they need a 30 day or 90 day supply?  ° °

## 2018-07-06 NOTE — Progress Notes (Signed)
Carelink Summary Report / Loop Recorder 

## 2018-07-16 LAB — CUP PACEART REMOTE DEVICE CHECK
Implantable Pulse Generator Implant Date: 20180822
MDC IDC SESS DTM: 20190803200717

## 2018-07-20 ENCOUNTER — Ambulatory Visit (INDEPENDENT_AMBULATORY_CARE_PROVIDER_SITE_OTHER): Payer: Medicare Other | Admitting: Physician Assistant

## 2018-07-20 ENCOUNTER — Encounter: Payer: Self-pay | Admitting: Physician Assistant

## 2018-07-20 VITALS — BP 178/98 | HR 70 | Ht 67.0 in | Wt 156.0 lb

## 2018-07-20 DIAGNOSIS — Z8673 Personal history of transient ischemic attack (TIA), and cerebral infarction without residual deficits: Secondary | ICD-10-CM | POA: Diagnosis not present

## 2018-07-20 DIAGNOSIS — I10 Essential (primary) hypertension: Secondary | ICD-10-CM | POA: Diagnosis not present

## 2018-07-20 DIAGNOSIS — I700 Atherosclerosis of aorta: Secondary | ICD-10-CM | POA: Diagnosis not present

## 2018-07-20 DIAGNOSIS — I5022 Chronic systolic (congestive) heart failure: Secondary | ICD-10-CM | POA: Diagnosis not present

## 2018-07-20 HISTORY — DX: Atherosclerosis of aorta: I70.0

## 2018-07-20 MED ORDER — CARVEDILOL 6.25 MG PO TABS: 6.2500 mg | ORAL_TABLET | Freq: Two times a day (BID) | ORAL | 3 refills | Status: DC

## 2018-07-20 MED ORDER — LISINOPRIL 20 MG PO TABS: 20.0000 mg | ORAL_TABLET | Freq: Every day | ORAL | 3 refills | Status: DC

## 2018-07-20 NOTE — Progress Notes (Signed)
Cardiology Office Note:    Date:  07/20/2018   ID:  CARSEN Bowman, DOB January 12, 1951, MRN [id number]  PCP:  Elizabeth Elk, MD  Cardiologist:  Elizabeth Furbish, MD  Electrophysiologist:  None   Referring MD: Elizabeth Elk, MD   Chief Complaint  Patient presents with  . Blood Pressure Elevated    History of Present Illness:    Elizabeth Bowman is a 67 y.o. 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  The patient was last seen in clinic by Dr. Marlou Bowman in July 2019.  Her heart failure regimen was adjusted by Entresto.  Elizabeth Bowman returns for evaluation of blood pressure.  When she went to the pharmacy to pick up Elizabeth Bowman, she discovered that after the first 30 days the cost would be over $400 a month.  Therefore, she did not get this drug.  She remained on lisinopril.  When this ran out, she had no refills.  She has been out of lisinopril for the last 3 weeks.  She is only taking carvedilol once a day.  She changed the dose of carvedilol on her own sometime ago because her blood pressure was running low and she was symptomatic.  Currently she denies chest pain, shortness of breath, headache, blurry vision, orthopnea, PND, lower extremity swelling.  She denies syncope.  Prior CV studies:   The following studies were reviewed today:  Carotid US 06/20/18 Final Interpretation: Right Carotid: Velocities in the right ICA are consistent with a 1-39% stenosis. Left Carotid: Velocities in the left ICA are consistent with a 1-39% stenosis. Vertebrals: Bilateral vertebral arteries demonstrate antegrade flow.  Transcranial doppler 06/20/18 Final Interpretation: This was a normal transcranial Doppler study, with normal flow direction  and velocity of all identified vessels of the anterior and posterior circulations, with no evidence of stenosis  Cardiac Catheterization 09/08/17 LAD ost 20 LCx ost 10, OM1 10 EF 25-35  Nuclear stress test 08/25/17  The left ventricular ejection fraction is moderately decreased (30-44%).  Nuclear stress EF: 36%.  There was no ST segment deviation noted during stress.  Defect 1: There is a small defect of mild severity present in the apex location.  Defect 2: There is a medium defect of moderate severity present in the basal inferior and mid inferior location. There is ischemia in this distribution  This is a high risk study with reduced ejection fraction and areas of infarct and ischemia identified.  Consider cardiac catheterization if stable from stroke perspective.  TEE 06/21/17 Mild conc LVH, EF 30-35, diff HK, mild MR  Echo 06/19/17 Mild LVH, EF 30-35, diff HK, Gr 1 DD, MAC  Past Medical History:  Diagnosis Date  . Aortic atherosclerosis ([place]) 07/20/2018   Noted on CT in 2018  . Complication of anesthesia   . Diabetes mellitus ([place])   . Former tobacco use   . HTN (hypertension)   . Hyperlipidemia   . Stroke [facility])    Surgical Hx: The patient  has a past surgical history that includes TEE without cardioversion (N/A, 06/21/2017); LOOP RECORDER INSERTION (N/A, 06/21/2017); and LEFT HEART CATH AND CORONARY ANGIOGRAPHY (N/A, 09/08/2017).   Current Medications: Current Meds  Medication Sig  . atorvastatin (LIPITOR) 80 MG tablet Take 1 tablet (80 mg total) by mouth daily at 6 PM.  . clopidogrel (PLAVIX)  75 MG tablet Take 1 tablet (75 mg total) by mouth daily.  . metFORMIN (GLUCOPHAGE) 500 MG tablet TAKE 1 TABLET BY MOUTH ONCE DAILY WITH BREAKFAST  . pantoprazole (PROTONIX) 40 MG tablet Take 40 mg by mouth daily as needed (heartburn).   . [DISCONTINUED] carvedilol (COREG) 6.25 MG tablet Take 1 tablet (6.25 mg total) by mouth 2 (two) times daily with a meal.  . [DISCONTINUED]  sacubitril-valsartan (ENTRESTO) 24-26 MG Take 1 tablet by mouth 2 (two) times daily.     Allergies:   Patient has no known allergies.   Social History   Tobacco Use  . Smoking status: Former Smoker    Last attempt to quit: 06/19/2007    Years since quitting: 11.0  . Smokeless tobacco: Former Systems developer  . Tobacco comment: Smoked for 20-30 years  Substance Use Topics  . Alcohol use: Yes    Comment: occasional drink a few times a month, not regularly  . Drug use: No     Family Hx: The patient's family history includes Cancer in her brother and father. There is no history of CAD, Heart attack, or Congestive Heart Failure.  ROS:   Please see the history of present illness.    ROS All other systems reviewed and are negative.   EKGs/Labs/Other Test Reviewed:    EKG:  EKG is  ordered today.  The ekg ordered today demonstrates normal sinus rhythm, heart rate 70, normal axis, QTC 434, LVH, similar to old EKGs  Recent Labs: 09/06/2017: Hemoglobin 12.1; Platelets 304 06/11/2018: BUN 10; Creatinine, Ser 0.77; Potassium 3.6; Sodium 143   Recent Lipid Panel Lab Results  Component Value Date/Time   CHOL 160 03/07/2018 10:41 AM   TRIG 84 03/07/2018 10:41 AM   HDL 48 03/07/2018 10:41 AM   CHOLHDL 3.3 03/07/2018 10:41 AM   CHOLHDL 3.8 06/19/2017 03:10 AM   LDLCALC 95 03/07/2018 10:41 AM    Physical Exam:    VS:  BP (!) 178/98 (BP Location: Left Arm, Patient Position: Sitting, Cuff Size: Normal)   Pulse 70   Ht _0  (1.702 m)   Wt 156 lb (70.8 kg)   BMI 24.43 kg/m     Wt Readings from Last 3 Encounters:  07/20/18 156 lb (70.8 kg)  06/13/18 158 lb 9.6 oz (71.9 kg)  05/15/18 158 lb (71.7 kg)     Physical Exam  Constitutional: She is oriented to person, place, and time. She appears well-developed and well-nourished. No distress.  HENT:  Head: Normocephalic and atraumatic.  Eyes: No scleral icterus.  Neck: Neck supple. No JVD present. No thyromegaly present.  Cardiovascular:  Normal rate and regular rhythm.  No murmur heard. Pulmonary/Chest: Effort normal. She has no rales.  Abdominal: Soft. She exhibits no distension.  Musculoskeletal: She exhibits no edema.  Lymphadenopathy:    She has no cervical adenopathy.  Neurological: She is alert and oriented to person, place, and time.  Skin: Skin is warm and dry.  Psychiatric: She has a normal mood and affect.    ASSESSMENT & PLAN:    Essential hypertension Blood pressure out of control.  She is only taking carvedilol once a day.  She has not been on lisinopril for 3 weeks.  Her previous dose of lisinopril was actually lisinopril/HCTZ 20/12.5 mg daily.  She did have to reduce her carvedilol on her own due to symptomatic low blood pressure.  Therefore, I have recommended that we start her back on lisinopril 20 mg daily (without HCTZ).  I have  also recommended that she take carvedilol twice a day as scheduled.  -Start lisinopril 20 mg daily  -Change carvedilol to 6.25 mg twice daily  -Follow-up 1 week  Chronic systolic CHF (congestive heart failure) (HCC) EF 30-35.  NYHA 2.  She is not volume overloaded.  As noted, [name] was too expensive.  We will see if there is any available financial assistance.  If not, she will continue on lisinopril.  Consider adding hydralazine/nitrates if blood pressure remains uncontrolled.  Adjust carvedilol and resume lisinopril as noted above.  History of stroke Status post ILR.  No documented atrial fibrillation.  Aortic atherosclerosis (HCC) Continue clopidogrel.   Dispo:  Return in about 1 week (around 07/27/2018) for Close Follow Up w/ Truitt Merle, NP, or Richardson Dopp, PA-C.   Medication Adjustments/Labs and Tests Ordered: Current medicines are reviewed at length with the patient today.  Concerns regarding medicines are outlined above.  Tests Ordered: Orders Placed This Encounter  Procedures  . EKG 12-Lead   Medication Changes: Meds ordered this encounter  Medications   . carvedilol (COREG) 6.25 MG tablet    Sig: Take 1 tablet (6.25 mg total) by mouth 2 (two) times daily.    Dispense:  180 tablet    Refill:  3  . lisinopril (PRINIVIL,ZESTRIL) 20 MG tablet    Sig: Take 1 tablet (20 mg total) by mouth daily.    Dispense:  90 tablet    Refill:  [address], Richardson Dopp, PA-C  07/20/2018 2:20 PM    [facility] [place], [place], [place]  [address] Phone: [phone]; Fax: [phone]

## 2018-07-20 NOTE — Patient Instructions (Signed)
Medication Instructions:  1. STOP ENTRESTO  2. MAKE SURE TO TAKE COREG 6.25 MG TWICE DAILY; RX HAS BEEN SENT IN   3. START LISINOPRIL 20 MG DAILY; RX HAS BEEN SENT IN   Labwork: NONE ORDERED TODAY  Testing/Procedures: NONE ORDERED TODAY  Follow-Up: Azucena Kuba 07/30/18 @ 11:45 AM   Any Other Special Instructions Will Be Listed Below (If Applicable).     If you need a refill on your cardiac medications before your next appointment, please call your pharmacy.

## 2018-07-23 NOTE — Telephone Encounter (Signed)
**Note De-identified Moustafa Mossa Obfuscation** -----  **Note De-Identified Riaz Onorato Obfuscation** Message from Tarri Fuller, [place] sent at 07/20/2018 12:15 PM EDT ----- Regarding: ASST PROGRAM Hi Larita Fife,  Pt saw Lorin Picket today and he would like to know if yo would be able to check to if we may be able to help pt with the ASST. Prgm for Entresto 24/26 mg BID. Pt was started on Entresto I believe by her PCP but she cannot afford Entresto. She will be coming back to see [facility] 07/30/18.   Thank you Okey Regal

## 2018-07-23 NOTE — Telephone Encounter (Signed)
**Note De-Identified Margot Oriordan Obfuscation** Per the pts pharmacy she maybe in the donut hole. Will discuss with the pt.  I have left a message on the pts VM asking her to call me back.

## 2018-07-24 NOTE — Telephone Encounter (Signed)
LMTCB

## 2018-07-27 LAB — CUP PACEART REMOTE DEVICE CHECK
Implantable Pulse Generator Implant Date: 20180822
MDC IDC SESS DTM: 20190905203804

## 2018-07-30 ENCOUNTER — Ambulatory Visit: Payer: Medicare Other | Admitting: Physician Assistant

## 2018-07-30 NOTE — Progress Notes (Deleted)
Cardiology Office Note:    Date:  07/30/2018   ID:  Elizabeth Bowman, DOB 1950-11-20, MRN [id number]  PCP:  Elizabeth Elk, MD  Cardiologist:  Elizabeth Furbish, MD *** Electrophysiologist:  None   Referring MD: Elizabeth Elk, MD   No chief complaint on file. ***  History of Present Illness:    Elizabeth Bowman is a 67 y.o. 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  The patient was last seen 07/20/18.  Her BP was markedly elevated.  She could not afford Entresto and her prescription for Lisinopril had expired.  I resumed her Lisinopril.  ***  Elizabeth Bowman ***  Prior CV studies:   The following studies were reviewed today:  *** Carotid US 06/20/18 Final Interpretation: Right Carotid: Velocities in the right ICA are consistent with a 1-39% stenosis. Left Carotid: Velocities in the left ICA are consistent with a 1-39% stenosis. Vertebrals: Bilateral vertebral arteries demonstrate antegrade flow.  Transcranial doppler 06/20/18 Final Interpretation: This was a normal transcranial Doppler study, with normal flow direction and velocity of all identified vessels of the anterior and posterior circulations, with no evidence of stenosis  Cardiac Catheterization 09/08/17 LAD ost 20 LCx ost 10, OM1 10 EF 25-35  Nuclear stress test 08/25/17  The left ventricular ejection fraction is moderately decreased (30-44%).  Nuclear stress EF: 36%.  There was no ST segment deviation noted during stress.  Defect 1: There is a small defect of mild severity present in the apex location.  Defect 2: There is a medium defect of moderate severity present in the basal inferior and mid inferior location. There is ischemia in this  distribution  This is a high risk study with reduced ejection fraction and areas of infarct and ischemia identified. Consider cardiac catheterization if stable from stroke perspective.  TEE 06/21/17 Mild conc LVH, EF 30-35, diff HK, mild MR  Echo 06/19/17 Mild LVH, EF 30-35, diff HK, Gr 1 DD, MAC Past Medical History:  Diagnosis Date  . Aortic atherosclerosis ([place]) 07/20/2018   Noted on CT in 2018  . Complication of anesthesia   . Diabetes mellitus ([place])   . Former tobacco use   . HTN (hypertension)   . Hyperlipidemia   . Stroke [facility])    Surgical Hx: The patient  has a past surgical history that includes TEE without cardioversion (N/A, 06/21/2017); LOOP RECORDER INSERTION (N/A, 06/21/2017); and LEFT HEART CATH AND CORONARY ANGIOGRAPHY (N/A, 09/08/2017).   Current Medications: No outpatient medications have been marked as taking for the 07/30/18 encounter (Appointment) with Elizabeth Bowman T, PA-C.     Allergies:   Patient has no known allergies.   Social History   Tobacco Use  . Smoking status: Former Smoker    Last attempt to quit: 06/19/2007    Years since quitting: 11.1  . Smokeless tobacco: Former Systems developer  . Tobacco comment: Smoked for 20-30 years  Substance Use Topics  . Alcohol use: Yes    Comment: occasional drink a few times a month, not regularly  . Drug use: No     Family Hx: The patient's family history includes Cancer in her brother and father. There is no history of CAD, Heart attack, or Congestive Heart Failure.  ROS:   Please see the history of present illness.    ROS All  other systems reviewed and are negative.   EKGs/Labs/Other Test Reviewed:    EKG:  EKG is *** ordered today.  The ekg ordered today demonstrates ***  Recent Labs: 09/06/2017: Hemoglobin 12.1; Platelets 304 06/11/2018: BUN 10; Creatinine, Ser 0.77; Potassium 3.6; Sodium 143   Recent Lipid Panel Lab Results  Component Value Date/Time   CHOL 160 03/07/2018 10:41 AM   TRIG 84 03/07/2018  10:41 AM   HDL 48 03/07/2018 10:41 AM   CHOLHDL 3.3 03/07/2018 10:41 AM   CHOLHDL 3.8 06/19/2017 03:10 AM   LDLCALC 95 03/07/2018 10:41 AM    Physical Exam:    VS:  There were no vitals taken for this visit.    Wt Readings from Last 3 Encounters:  07/20/18 156 lb (70.8 kg)  06/13/18 158 lb 9.6 oz (71.9 kg)  05/15/18 158 lb (71.7 kg)     ***Physical Exam  ASSESSMENT & PLAN:    Essential hypertension  Chronic systolic CHF (congestive heart failure) ([place])***  Essential hypertension Blood pressure out of control.  She is only taking carvedilol once a day.  She has not been on lisinopril for 3 weeks.  Her previous dose of lisinopril was actually lisinopril/HCTZ 20/12.5 mg daily.  She did have to reduce her carvedilol on her own due to symptomatic low blood pressure.  Therefore, I have recommended that we start her back on lisinopril 20 mg daily (without HCTZ).  I have also recommended that she take carvedilol twice a day as scheduled.             -Start lisinopril 20 mg daily             -Change carvedilol to 6.25 mg twice daily             -Follow-up 1 week  Chronic systolic CHF (congestive heart failure) (HCC) EF 30-35.  NYHA 2.  She is not volume overloaded.  As noted, Elizabeth Bowman was too expensive.  We will see if there is any available financial assistance.  If not, she will continue on lisinopril.  Consider adding hydralazine/nitrates if blood pressure remains uncontrolled.  Adjust carvedilol and resume lisinopril as noted above.  History of stroke Status post ILR.  No documented atrial fibrillation.  Aortic atherosclerosis (HCC) Continue clopidogrel.  Dispo:  No follow-ups on file.   Medication Adjustments/Labs and Tests Ordered: Current medicines are reviewed at length with the patient today.  Concerns regarding medicines are outlined above.  Tests Ordered: No orders of the defined types were placed in this encounter.  Medication Changes: No orders of the defined  types were placed in this encounter.   Signed, Elizabeth Dopp, PA-C  07/30/2018 8:02 AM    [facility] [place], [place], [place]  [address] Phone: [phone]; Fax: [phone]

## 2018-08-04 ENCOUNTER — Other Ambulatory Visit: Payer: Self-pay | Admitting: Nurse Practitioner

## 2018-08-07 ENCOUNTER — Ambulatory Visit (INDEPENDENT_AMBULATORY_CARE_PROVIDER_SITE_OTHER): Payer: Medicare Other | Admitting: *Deleted

## 2018-08-07 DIAGNOSIS — I639 Cerebral infarction, unspecified: Secondary | ICD-10-CM

## 2018-08-07 NOTE — Telephone Encounter (Signed)
° ° ° °*  STAT* If patient is at the pharmacy, call can be transferred to refill team.   1. Which medications need to be refilled? (please list name of each medication and dose if known) clopidogrel (PLAVIX) 75 MG tablet  2. Which pharmacy/location (including street and city if local pharmacy) is medication to be sent to? [id number] - [place], [place] - [address]  3. Do they need a 30 day or 90 day supply? 90

## 2018-08-07 NOTE — Telephone Encounter (Signed)
Pt's medication was sent to pt's pharmacy as requested confirmation received.  °

## 2018-08-08 NOTE — Progress Notes (Signed)
Carelink Summary Report / Loop Recorder 

## 2018-08-20 ENCOUNTER — Encounter: Payer: Self-pay | Admitting: Nurse Practitioner

## 2018-08-20 ENCOUNTER — Ambulatory Visit (INDEPENDENT_AMBULATORY_CARE_PROVIDER_SITE_OTHER): Payer: Medicare Other | Admitting: Nurse Practitioner

## 2018-08-20 VITALS — BP 150/82 | HR 63 | Ht 67.0 in | Wt 159.0 lb

## 2018-08-20 DIAGNOSIS — Z79899 Other long term (current) drug therapy: Secondary | ICD-10-CM

## 2018-08-20 DIAGNOSIS — I10 Essential (primary) hypertension: Secondary | ICD-10-CM | POA: Diagnosis not present

## 2018-08-20 DIAGNOSIS — I639 Cerebral infarction, unspecified: Secondary | ICD-10-CM | POA: Diagnosis not present

## 2018-08-20 DIAGNOSIS — I5022 Chronic systolic (congestive) heart failure: Secondary | ICD-10-CM | POA: Diagnosis not present

## 2018-08-20 NOTE — Progress Notes (Signed)
CARDIOLOGY OFFICE NOTE  Date:  08/20/2018    Elizabeth Bowman Date of Birth: 02/05/1951 Medical Record #[id number]  PCP:  Concepcion Elk, MD  Cardiologist:  Marisa Cyphers    Chief Complaint  Patient presents with  . Hypertension  . Congestive Heart Failure    Follow up visit - seen for Dr. Marlou Porch    History of Present Illness: Elizabeth Bowman is a 67 y.o. female who presents today for a follow up visit. Seen for Dr. Marlou Porch.   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  The patient was last seen in clinic by Dr. Marlou Porch in July 2019.  Her heart failure regimen was adjusted by Entresto.  Seen by Richardson Dopp, PA back in September - she was not able to afford Entresto - thus stayed on Lisinopril. Then ran out and had no refills. She was only taking Coreg once a day as well. She had cut that back previously due to BP running low.   Comes in today. Here with her husband. Says she is much more active. No chest pain. Not dizzy or lightheaded. She feels like she is doing well. Says BP typically staying below 824 systolic. She is tolerating her current regimen.   Past Medical History:  Diagnosis Date  . Aortic atherosclerosis ([place]) 07/20/2018   Noted on CT in 2018  . Complication of anesthesia   . Diabetes mellitus ([place])   . Former tobacco use   . HTN (hypertension)   . Hyperlipidemia   . Stroke [facility])     Past Surgical History:  Procedure Laterality Date  . LEFT HEART CATH AND CORONARY ANGIOGRAPHY N/A 09/08/2017   Procedure: LEFT HEART CATH AND CORONARY ANGIOGRAPHY;  Surgeon: Martinique, Peter M, MD;  Location: [place] CV LAB;  Service: Cardiovascular;  Laterality: N/A;  .  LOOP RECORDER INSERTION N/A 06/21/2017   Procedure: LOOP RECORDER INSERTION;  Surgeon: Evans Lance, MD;  Location: [place] CV LAB;  Service: Cardiovascular;  Laterality: N/A;  . TEE WITHOUT CARDIOVERSION N/A 06/21/2017   Procedure: TRANSESOPHAGEAL ECHOCARDIOGRAM (TEE);  Surgeon: Dorothy Spark, MD;  Location: [facility] ENDOSCOPY;  Service: Cardiovascular;  Laterality: N/A;     Medications: Current Meds  Medication Sig  . atorvastatin (LIPITOR) 80 MG tablet Take 1 tablet (80 mg total) by mouth daily at 6 PM.  . carvedilol (COREG) 6.25 MG tablet Take 1 tablet (6.25 mg total) by mouth 2 (two) times daily.  . clopidogrel (PLAVIX) 75 MG tablet TAKE 1 TABLET BY MOUTH ONCE DAILY  . lisinopril (PRINIVIL,ZESTRIL) 20 MG tablet Take 1 tablet (20 mg total) by mouth daily.  . metFORMIN (GLUCOPHAGE) 500 MG tablet TAKE 1 TABLET BY MOUTH ONCE DAILY WITH BREAKFAST  . pantoprazole (PROTONIX) 40 MG tablet Take 40 mg by mouth daily as needed (heartburn).      Allergies: No Known Allergies  Social History: The patient  reports that she quit smoking about 11 years ago. She has quit using smokeless tobacco. She reports that she drinks alcohol. She reports that she does not use drugs.   Family History: The patient's family history includes Cancer in her brother and father.   Review of Systems: Please see the history of present illness.   Otherwise, the review of systems  is positive for none.   All other systems are reviewed and negative.   Physical Exam: VS:  BP (!) 150/82 (BP Location: Left Arm, Patient Position: Sitting, Cuff Size: Normal)   Pulse 63   Ht _0  (1.702 m)   Wt 159 lb (72.1 kg)   SpO2 98% Comment: at rest  BMI 24.90 kg/m  .  BMI Body mass index is 24.9 kg/m.  Wt Readings from Last 3 Encounters:  08/20/18 159 lb (72.1 kg)  07/20/18 156 lb (70.8 kg)  06/13/18 158 lb 9.6 oz (71.9 kg)   BP by me is 122/78  General: Pleasant. Well developed, well nourished and in no acute  distress.   HEENT: Normal.  Neck: Supple, no JVD, carotid bruits, or masses noted.  Cardiac: Regular rate and rhythm. No murmurs, rubs, or gallops. No edema.  Respiratory:  Lungs are clear to auscultation bilaterally with normal work of breathing.  GI: Soft and nontender.  MS: No deformity or atrophy. Gait and ROM intact.  Skin: Warm and dry. Color is normal.  Neuro:  Strength and sensation are intact and no gross focal deficits noted.  Psych: Alert, appropriate and with normal affect.   LABORATORY DATA:  EKG:  EKG is not ordered today.  Lab Results  Component Value Date   WBC 7.8 09/06/2017   HGB 12.1 09/06/2017   HCT 35.3 09/06/2017   PLT 304 09/06/2017   GLUCOSE 99 06/11/2018   CHOL 160 03/07/2018   TRIG 84 03/07/2018   HDL 48 03/07/2018   LDLCALC 95 03/07/2018   NA 143 06/11/2018   K 3.6 06/11/2018   CL 101 06/11/2018   CREATININE 0.77 06/11/2018   BUN 10 06/11/2018   CO2 29 06/11/2018   TSH 2.701 06/20/2017   INR 1.0 09/06/2017   HGBA1C 6.1 (H) 06/18/2017     BNP (last 3 results) No results for input(s): BNP in the last 8760 hours.  ProBNP (last 3 results) No results for input(s): PROBNP in the last 8760 hours.   Other Studies Reviewed Today:  Carotid US 06/20/18 Final Interpretation: Right Carotid: Velocities in the right ICA are consistent with a 1-39% stenosis. Left Carotid: Velocities in the left ICA are consistent with a 1-39% stenosis. Vertebrals: Bilateral vertebral arteries demonstrate antegrade flow.  Transcranial doppler 06/20/18 Final Interpretation: This was a normal transcranial Doppler study, with normal flow direction and velocity of all identified vessels of the anterior and posterior circulations, with no evidence of stenosis  Cardiac Catheterization 09/08/17 LAD ost 20 LCx ost 10, OM1 10 EF 25-35  Nuclear stress test 08/25/17  The left ventricular ejection fraction is moderately decreased (30-44%).  Nuclear stress EF:  36%.  There was no ST segment deviation noted during stress.  Defect 1: There is a small defect of mild severity present in the apex location.  Defect 2: There is a medium defect of moderate severity present in the basal inferior and mid inferior location. There is ischemia in this distribution  This is a high risk study with reduced ejection fraction and areas of infarct and ischemia identified. Consider cardiac catheterization if stable from stroke perspective.  TEE 06/21/17 Mild conc LVH, EF 30-35, diff HK, mild MR  Echo 06/19/17 Mild LVH, EF 30-35, diff HK, Gr 1 DD, MAC  Assessment/Plan:  1. HTN - she is back on ACE and she is on BID dosing of her beta blocker. BP looks good by me. She is able to tolerate this regimen.   2.  Chronic systolic HF - NYHA I/II - not able to afford Entresto. Back on ACE. No evidence of volume overload - may consider repeat echo on return. She has been pretty sensitive to medicines. BP ok at this time and she is tolerating this regimen.   3. Prior cryptogenic stroke - has ILR in place - no AF noted.   4. Aortic atherosclerosis ([place]) -  She is on Plavix. CV risk factor modification encouraged.   5. Paraophthalmic artery aneurysm noted on CT in 2018 - she is seeing neurology - would defer evaluation to them.   Current medicines are reviewed with the patient today.  The patient does not have concerns regarding medicines other than what has been noted above.  The following changes have been made:  See above.  Labs/ tests ordered today include:    Orders Placed This Encounter  Procedures  . Basic metabolic panel     Disposition:   FU with me in 4 months. Will consider echo on return.    Patient is agreeable to this plan and will call if any problems develop in the interim.   SignedTruitt Merle, NP  08/20/2018 10:56 AM  [facility] [address] [place], [place]  [address] Phone: [phone] Fax: [phone]

## 2018-08-20 NOTE — Patient Instructions (Addendum)
We will be checking the following labs today - BMET  If you have labs (blood work) drawn today and your tests are completely normal, you will receive your results only by: Marland Kitchen MyChart Message (if you have MyChart) OR . A paper copy in the mail If you have any lab test that is abnormal or we need to change your treatment, we will call you to review the results.   Medication Instructions:    Continue with your current medicines.    If you need a refill on your cardiac medications before your next appointment, please call your pharmacy.     Testing/Procedures To Be Arranged:  N/A  Follow-Up:   See me in 4 months    At [facility], you and your health needs are our priority.  As part of our continuing mission to provide you with exceptional heart care, we have created designated Provider Care Teams.  These Care Teams include your primary Cardiologist (physician) and Advanced Practice Providers (APPs -  Physician Assistants and Nurse Practitioners) who all work together to provide you with the care you need, when you need it.  Special Instructions:  . None  Call the [facility] office at [phone] if you have any questions, problems or concerns.

## 2018-08-21 LAB — BASIC METABOLIC PANEL
BUN/Creatinine Ratio: 9 — ABNORMAL LOW (ref 12–28)
BUN: 7 mg/dL — ABNORMAL LOW (ref 8–27)
CO2: 26 mmol/L (ref 20–29)
Calcium: 9.5 mg/dL (ref 8.7–10.3)
Chloride: 103 mmol/L (ref 96–106)
Creatinine, Ser: 0.76 mg/dL (ref 0.57–1.00)
GFR calc Af Amer: 93 mL/min/{1.73_m2} (ref >59)
GFR calc non Af Amer: 80 mL/min/{1.73_m2} (ref >59)
Glucose: 122 mg/dL — ABNORMAL HIGH (ref 65–99)
Potassium: 3.4 mmol/L — ABNORMAL LOW (ref 3.5–5.2)
Sodium: 143 mmol/L (ref 134–144)

## 2018-08-22 LAB — CUP PACEART REMOTE DEVICE CHECK
Date Time Interrogation Session: 20191008204037
Implantable Pulse Generator Implant Date: 20180822

## 2018-08-23 ENCOUNTER — Other Ambulatory Visit: Payer: Self-pay | Admitting: *Deleted

## 2018-08-23 DIAGNOSIS — E876 Hypokalemia: Secondary | ICD-10-CM

## 2018-08-23 NOTE — Progress Notes (Signed)
error 

## 2018-09-05 ENCOUNTER — Other Ambulatory Visit: Payer: Medicare Other

## 2018-09-10 ENCOUNTER — Ambulatory Visit (INDEPENDENT_AMBULATORY_CARE_PROVIDER_SITE_OTHER): Payer: Medicare Other | Admitting: *Deleted

## 2018-09-10 DIAGNOSIS — I639 Cerebral infarction, unspecified: Secondary | ICD-10-CM | POA: Diagnosis not present

## 2018-09-11 NOTE — Progress Notes (Signed)
Carelink Summary Report / Loop Recorder 

## 2018-09-12 ENCOUNTER — Other Ambulatory Visit: Payer: Medicare Other | Admitting: *Deleted

## 2018-09-12 DIAGNOSIS — E876 Hypokalemia: Secondary | ICD-10-CM

## 2018-09-12 LAB — BASIC METABOLIC PANEL
BUN/Creatinine Ratio: 11 — ABNORMAL LOW (ref 12–28)
BUN: 9 mg/dL (ref 8–27)
CO2: 24 mmol/L (ref 20–29)
Calcium: 9.6 mg/dL (ref 8.7–10.3)
Chloride: 104 mmol/L (ref 96–106)
Creatinine, Ser: 0.83 mg/dL (ref 0.57–1.00)
GFR calc Af Amer: 83 mL/min/{1.73_m2} (ref >59)
GFR calc non Af Amer: 72 mL/min/{1.73_m2} (ref >59)
Glucose: 109 mg/dL — ABNORMAL HIGH (ref 65–99)
Potassium: 4.1 mmol/L (ref 3.5–5.2)
Sodium: 144 mmol/L (ref 134–144)

## 2018-10-12 ENCOUNTER — Ambulatory Visit (INDEPENDENT_AMBULATORY_CARE_PROVIDER_SITE_OTHER): Payer: Medicare Other

## 2018-10-12 DIAGNOSIS — I639 Cerebral infarction, unspecified: Secondary | ICD-10-CM

## 2018-10-15 NOTE — Progress Notes (Signed)
Carelink Summary Report / Loop Recorder 

## 2018-10-30 LAB — CUP PACEART REMOTE DEVICE CHECK
Date Time Interrogation Session: 20191110211042
Implantable Pulse Generator Implant Date: 20180822

## 2018-11-14 ENCOUNTER — Ambulatory Visit (INDEPENDENT_AMBULATORY_CARE_PROVIDER_SITE_OTHER): Payer: Medicare Other

## 2018-11-14 DIAGNOSIS — I639 Cerebral infarction, unspecified: Secondary | ICD-10-CM | POA: Diagnosis not present

## 2018-11-15 NOTE — Progress Notes (Signed)
Carelink Summary Report / Loop Recorder 

## 2018-11-16 LAB — CUP PACEART REMOTE DEVICE CHECK
Date Time Interrogation Session: 20200115214027
Implantable Pulse Generator Implant Date: 20180822

## 2018-11-18 LAB — CUP PACEART REMOTE DEVICE CHECK
Date Time Interrogation Session: 20191213213806
Implantable Pulse Generator Implant Date: 20180822

## 2018-12-14 ENCOUNTER — Ambulatory Visit: Payer: Medicare Other | Admitting: Adult Health

## 2018-12-17 ENCOUNTER — Ambulatory Visit (INDEPENDENT_AMBULATORY_CARE_PROVIDER_SITE_OTHER): Payer: Medicare Other

## 2018-12-17 DIAGNOSIS — I639 Cerebral infarction, unspecified: Secondary | ICD-10-CM

## 2018-12-19 LAB — CUP PACEART REMOTE DEVICE CHECK
Date Time Interrogation Session: 20200217223940
MDC IDC PG IMPLANT DT: 20180822

## 2018-12-24 ENCOUNTER — Ambulatory Visit: Payer: Medicare Other | Admitting: Nurse Practitioner

## 2018-12-24 NOTE — Progress Notes (Deleted)
CARDIOLOGY OFFICE NOTE  Date:  12/24/2018    DESTANE SPEAS Date of Birth: 08/13/1951 Medical Record #[id number]  PCP:  Concepcion Elk, MD  Cardiologist:  Marisa Cyphers    No chief complaint on file.   History of Present Illness: JENNAE HAKEEM is a 68 y.o. female who presents today for a 6 month check. Seen for Dr. Marlou Porch.   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 The patient was last seen in clinic by Dr. Marlou Porch in July 2019. Tried to transition to [place] - but not able to afford so she was continued on her ACE. She has had issues with not taking her medicines correctly in the past.   I last saw her in October. She was felt to be stable from our standpoint.    Comes in today. Here with her husband.   Past Medical History:  Diagnosis Date  . Aortic atherosclerosis ([place]) 07/20/2018   Noted on CT in 2018  . Complication of anesthesia   . Diabetes mellitus ([place])   . Former tobacco use   . HTN (hypertension)   . Hyperlipidemia   . Stroke [facility])     Past Surgical History:  Procedure Laterality Date  . LEFT HEART CATH AND CORONARY ANGIOGRAPHY N/A 09/08/2017   Procedure: LEFT HEART CATH AND CORONARY ANGIOGRAPHY;  Surgeon: Martinique, Peter M, MD;  Location: [place] CV LAB;  Service: Cardiovascular;  Laterality: N/A;  . LOOP RECORDER INSERTION N/A 06/21/2017   Procedure: LOOP RECORDER INSERTION;  Surgeon: Evans Lance, MD;  Location: [place] CV LAB;  Service: Cardiovascular;  Laterality: N/A;  . TEE WITHOUT CARDIOVERSION N/A 06/21/2017   Procedure: TRANSESOPHAGEAL ECHOCARDIOGRAM (TEE);  Surgeon: Dorothy Spark, MD;  Location: [facility] ENDOSCOPY;  Service:  Cardiovascular;  Laterality: N/A;     Medications: No outpatient medications have been marked as taking for the 12/24/18 encounter (Appointment) with Burtis Junes, NP.     Allergies: No Known Allergies  Social History: The patient  reports that she quit smoking about 11 years ago. She has quit using smokeless tobacco. She reports current alcohol use. She reports that she does not use drugs.   Family History: The patient's family history includes Cancer in her brother and father.   Review of Systems: Please see the history of present illness.   Otherwise, the review of systems is positive for none.   All other systems are reviewed and negative.   Physical Exam: VS:  There were no vitals taken for this visit. Marland Kitchen  BMI There is no height or weight on file to calculate BMI.  Wt Readings from Last 3 Encounters:  08/20/18 159 lb (72.1 kg)  07/20/18 156 lb (70.8 kg)  06/13/18 158 lb 9.6 oz (71.9 kg)    General: Pleasant. Well developed, well nourished and in no acute distress.   HEENT: Normal.  Neck: Supple, no JVD, carotid bruits, or masses noted.  Cardiac: Regular rate and rhythm. No murmurs, rubs, or gallops. No edema.  Respiratory:  Lungs are clear to auscultation bilaterally with normal work of breathing.  GI: Soft and nontender.  MS: No deformity or atrophy. Gait and ROM intact.  Skin: Warm and dry. Color is normal.  Neuro:  Strength and sensation are intact  and no gross focal deficits noted.  Psych: Alert, appropriate and with normal affect.   LABORATORY DATA:  EKG:  EKG is ordered today. This demonstrates .  Lab Results  Component Value Date   WBC 7.8 09/06/2017   HGB 12.1 09/06/2017   HCT 35.3 09/06/2017   PLT 304 09/06/2017   GLUCOSE 109 (H) 09/12/2018   CHOL 160 03/07/2018   TRIG 84 03/07/2018   HDL 48 03/07/2018   LDLCALC 95 03/07/2018   NA 144 09/12/2018   K 4.1 09/12/2018   CL 104 09/12/2018   CREATININE 0.83 09/12/2018   BUN 9 09/12/2018   CO2 24  09/12/2018   TSH 2.701 06/20/2017   INR 1.0 09/06/2017   HGBA1C 6.1 (H) 06/18/2017     BNP (last 3 results) No results for input(s): BNP in the last 8760 hours.  ProBNP (last 3 results) No results for input(s): PROBNP in the last 8760 hours.   Other Studies Reviewed Today:   Assessment/Plan: Carotid US 06/20/18 Final Interpretation: Right Carotid: Velocities in the right ICA are consistent with a 1-39% stenosis. Left Carotid: Velocities in the left ICA are consistent with a 1-39% stenosis. Vertebrals: Bilateral vertebral arteries demonstrate antegrade flow.  Transcranial doppler 06/20/18 Final Interpretation: This was a normal transcranial Doppler study, with normal flow direction and velocity of all identified vessels of the anterior and posterior circulations, with no evidence of stenosis  Cardiac Catheterization11/9/18 LAD ost 20 LCx ost 10, OM1 10 EF 25-35  Nuclear stress test10/26/18  The left ventricular ejection fraction is moderately decreased (30-44%).  Nuclear stress EF: 36%.  There was no ST segment deviation noted during stress.  Defect 1: There is a small defect of mild severity present in the apex location.  Defect 2: There is a medium defect of moderate severity present in the basal inferior and mid inferior location. There is ischemia in this distribution  This is a high risk study with reduced ejection fraction and areas of infarct and ischemia identified. Consider cardiac catheterization if stable from stroke perspective.  TEE 06/21/17 Mild conc LVH, EF 30-35, diff HK, mild MR  Echo 06/19/17 Mild LVH, EF 30-35, diff HK, Gr 1 DD, MAC  Assessment/Plan:  1. HTN - she is back on ACE and she is on BID dosing of her beta blocker. BP looks good by me. She is able to tolerate this regimen.   2. Chronic systolic HF - NYHA I/II - not able to afford Entresto. Back on ACE. No evidence of volume overload - may consider repeat echo on return. She  has been pretty sensitive to medicines. BP ok at this time and she is tolerating this regimen.   3. Prior cryptogenic stroke - has ILR in place - no AF noted.   4. Aortic atherosclerosis ([place]) -  She is on Plavix. CV risk factor modification encouraged.   5. Paraophthalmic artery aneurysm   Current medicines are reviewed with the patient today.  The patient does not have concerns regarding medicines other than what has been noted above.  The following changes have been made:  See above.  Labs/ tests ordered today include:   No orders of the defined types were placed in this encounter.    Disposition:   FU with *** in {gen number 7-32:202542} {Days to years:10300}.   Patient is agreeable to this plan and will call if any problems develop in the interim.   SignedTruitt Merle, NP  12/24/2018 7:49 AM  [facility]  [place], [place]  [address] Phone: [phone] Fax: [phone]

## 2018-12-25 ENCOUNTER — Encounter: Payer: Self-pay | Admitting: Nurse Practitioner

## 2018-12-26 NOTE — Progress Notes (Signed)
Carelink Summary Report / Loop Recorder 

## 2019-01-07 ENCOUNTER — Ambulatory Visit: Payer: Medicare Other | Admitting: Nurse Practitioner

## 2019-01-07 NOTE — Progress Notes (Deleted)
CARDIOLOGY OFFICE NOTE  Date:  01/07/2019    Elizabeth Bowman Date of Birth: [birth year] Medical Record #[id number]  PCP:  Concepcion Elk, MD  Cardiologist:  Servando Snare & ***    No chief complaint on file.   History of Present Illness: Elizabeth Bowman is a 68 y.o. female who presents today for a ***  Seen for Dr. Marlou Porch.   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 The patient was last seen in clinic by Dr. Marlou Porch in July 2019. Her heart failure regimen was adjusted by Entresto.  Seen by Richardson Dopp, PA back in September - she was not able to afford Entresto - thus stayed on Lisinopril. Then ran out and had no refills. She was only taking Coreg once a day as well. She had cut that back previously due to BP running low.   Comes in today. Here with her husband. Says she is much more active. No chest pain. Not dizzy or lightheaded. She feels like she is doing well. Says BP typically staying below 591 systolic. She is tolerating her current regimen.   Comes in today. Here with   Past Medical History:  Diagnosis Date  . Aortic atherosclerosis ([place]) 07/20/2018   Noted on CT in 2018  . Complication of anesthesia   . Diabetes mellitus ([place])   . Former tobacco use   . HTN (hypertension)   . Hyperlipidemia   . Stroke [facility])     Past Surgical History:  Procedure Laterality Date  . LEFT HEART CATH AND CORONARY ANGIOGRAPHY N/A 09/08/2017   Procedure: LEFT HEART CATH AND CORONARY ANGIOGRAPHY;  Surgeon: Martinique, Peter M, MD;  Location: [place] CV LAB;  Service: Cardiovascular;  Laterality: N/A;  . LOOP RECORDER INSERTION N/A 06/21/2017   Procedure: LOOP RECORDER INSERTION;   Surgeon: Evans Lance, MD;  Location: [place] CV LAB;  Service: Cardiovascular;  Laterality: N/A;  . TEE WITHOUT CARDIOVERSION N/A 06/21/2017   Procedure: TRANSESOPHAGEAL ECHOCARDIOGRAM (TEE);  Surgeon: Dorothy Spark, MD;  Location: [facility] ENDOSCOPY;  Service: Cardiovascular;  Laterality: N/A;     Medications: No outpatient medications have been marked as taking for the 01/07/19 encounter (Appointment) with Burtis Junes, NP.     Allergies: No Known Allergies  Social History: The patient  reports that she quit smoking about 11 years ago. She has quit using smokeless tobacco. She reports current alcohol use. She reports that she does not use drugs.   Family History: The patient's ***family history includes Cancer in her brother and father.   Review of Systems: Please see the history of present illness.   Otherwise, the review of systems is positive for {NONE DEFAULTED:18576::"none"}.   All other systems are reviewed and negative.   Physical Exam: VS:  There were no vitals taken for this visit. Marland Kitchen  BMI There is no height or weight on file to calculate BMI.  Wt Readings from Last 3 Encounters:  08/20/18 159 lb (72.1 kg)  07/20/18 156 lb (70.8 kg)  06/13/18 158 lb 9.6 oz (71.9 kg)    General: Pleasant. Well developed, well nourished and in no acute distress.   HEENT: Normal.  Neck: Supple, no JVD, carotid bruits, or masses noted.  Cardiac: ***Regular rate and rhythm. No murmurs, rubs,  or gallops. No edema.  Respiratory:  Lungs are clear to auscultation bilaterally with normal work of breathing.  GI: Soft and nontender.  MS: No deformity or atrophy. Gait and ROM intact.  Skin: Warm and dry. Color is normal.  Neuro:  Strength and sensation are intact and no gross focal deficits noted.  Psych: Alert, appropriate and with normal affect.   LABORATORY DATA:  EKG:  EKG {ACTION; IS/IS ZOX:09604540} ordered today. This demonstrates ***.  Lab Results  Component Value Date    WBC 7.8 09/06/2017   HGB 12.1 09/06/2017   HCT 35.3 09/06/2017   PLT 304 09/06/2017   GLUCOSE 109 (H) 09/12/2018   CHOL 160 03/07/2018   TRIG 84 03/07/2018   HDL 48 03/07/2018   LDLCALC 95 03/07/2018   NA 144 09/12/2018   K 4.1 09/12/2018   CL 104 09/12/2018   CREATININE 0.83 09/12/2018   BUN 9 09/12/2018   CO2 24 09/12/2018   TSH 2.701 06/20/2017   INR 1.0 09/06/2017   HGBA1C 6.1 (H) 06/18/2017     BNP (last 3 results) No results for input(s): BNP in the last 8760 hours.  ProBNP (last 3 results) No results for input(s): PROBNP in the last 8760 hours.   Other Studies Reviewed Today:   Assessment/Plan: Carotid US 06/20/18 Final Interpretation: Right Carotid: Velocities in the right ICA are consistent with a 1-39% stenosis. Left Carotid: Velocities in the left ICA are consistent with a 1-39% stenosis. Vertebrals: Bilateral vertebral arteries demonstrate antegrade flow.  Transcranial doppler 06/20/18 Final Interpretation: This was a normal transcranial Doppler study, with normal flow direction and velocity of all identified vessels of the anterior and posterior circulations, with no evidence of stenosis  Cardiac Catheterization11/9/18 LAD ost 20 LCx ost 10, OM1 10 EF 25-35  Nuclear stress test10/26/18  The left ventricular ejection fraction is moderately decreased (30-44%).  Nuclear stress EF: 36%.  There was no ST segment deviation noted during stress.  Defect 1: There is a small defect of mild severity present in the apex location.  Defect 2: There is a medium defect of moderate severity present in the basal inferior and mid inferior location. There is ischemia in this distribution  This is a high risk study with reduced ejection fraction and areas of infarct and ischemia identified. Consider cardiac catheterization if stable from stroke perspective.  TEE 06/21/17 Mild conc LVH, EF 30-35, diff HK, mild MR  Echo 06/19/17 Mild LVH, EF 30-35, diff HK,  Gr 1 DD, MAC  Assessment/Plan:  1. HTN - she is back on ACE and she is on BID dosing of her beta blocker. BP looks good by me. She is able to tolerate this regimen.   2. Chronic systolic HF - NYHA I/II - not able to afford Entresto. Back on ACE. No evidence of volume overload - may consider repeat echo on return. She has been pretty sensitive to medicines. BP ok at this time and she is tolerating this regimen.   3. Prior cryptogenic stroke - has ILR in place - no AF noted.   4. Aortic atherosclerosis ([place]) -  She is on Plavix. CV risk factor modification encouraged.   5. Paraophthalmic artery aneurysm noted on CT in 2018 - she is seeing neurology - would defer evaluation to them.   Current medicines are reviewed with the patient today.  The patient does not have concerns regarding medicines other than what has been noted above.  The following changes have been made:  See above.  Labs/ tests ordered today include:   No orders of the defined types were placed in this encounter.    Disposition:   FU with *** in {gen number 7-26:[id number]} {Days to years:10300}.   Patient is agreeable to this plan and will call if any problems develop in the interim.   SignedTruitt Merle, NP  01/07/2019 7:30 AM  [facility] [address] [place], [place]  [address] Phone: [phone] Fax: [phone]

## 2019-01-16 ENCOUNTER — Encounter: Payer: Self-pay | Admitting: Nurse Practitioner

## 2019-01-16 ENCOUNTER — Other Ambulatory Visit: Payer: Self-pay

## 2019-01-16 ENCOUNTER — Ambulatory Visit: Payer: Medicare Other | Admitting: Nurse Practitioner

## 2019-01-16 VITALS — BP 110/70 | HR 69 | Ht 67.0 in | Wt 153.8 lb

## 2019-01-16 DIAGNOSIS — I428 Other cardiomyopathies: Secondary | ICD-10-CM

## 2019-01-16 DIAGNOSIS — I5022 Chronic systolic (congestive) heart failure: Secondary | ICD-10-CM | POA: Diagnosis not present

## 2019-01-16 NOTE — Progress Notes (Signed)
CARDIOLOGY OFFICE NOTE  Date:  01/16/2019    Elizabeth Bowman Date of Birth: 03-22-1951 Medical Record #[id number]  PCP:  Concepcion Elk, MD  Cardiologist:  Marisa Cyphers    Chief Complaint  Patient presents with   Congestive Heart Failure    Seen for Dr. Marlou Porch    History of Present Illness: Elizabeth Bowman is a 68 y.o. female who presents today for a follow up visit. Seen for Dr. Marlou Porch.   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 The patient was last seen in clinic by Dr. Marlou Porch in July 2019. Her heart failure regimen was adjusted by Entresto.  Seen by Richardson Dopp, PA back in September of 2019 - she was not able to afford Entresto - thus stayed on Lisinopril. Then ran out and had no refills. She was only taking Coreg once a day as well. She had cut that back previously due to BP running low.   I saw her back in October - she was felt to be doing ok.   She was admitted earlier this month at [facility] - presented with shortness of breath - found to be hypoxic and had respiratory failure - given Bipap and diuresed. Demand ischemia noted. She was sent home on Lasix. Echo was updated - EF of 28%.   Patient and husband screened for recent travel, fever, URI symptoms and shortness of breath. Patient and husband deny travel over the last 14 days and are currently without symptoms.    Comes in today. Here with her husband. She says she is feeling better. Her breathing has improved. Energy level is some improved. No chest pain. Unclear what the trigger for this was but I suspect there is sodium indiscretion. Her loop is in place - last check last month ok. Not dizzy. BP  still soft.   Past Medical History:  Diagnosis Date   Aortic atherosclerosis ([place]) 07/20/2018   Noted on CT in 6295   Complication of anesthesia    Diabetes mellitus ([place])    Former tobacco use    HTN (hypertension)    Hyperlipidemia    Stroke [facility])     Past Surgical History:  Procedure Laterality Date   LEFT HEART CATH AND CORONARY ANGIOGRAPHY N/A 09/08/2017   Procedure: LEFT HEART CATH AND CORONARY ANGIOGRAPHY;  Surgeon: Martinique, Peter M, MD;  Location: [facility] LAB;  Service: Cardiovascular;  Laterality: N/A;   LOOP RECORDER INSERTION N/A 06/21/2017   Procedure: LOOP RECORDER INSERTION;  Surgeon: Evans Lance, MD;  Location: [facility];  Service: Cardiovascular;  Laterality: N/A;   TEE WITHOUT CARDIOVERSION N/A 06/21/2017   Procedure: TRANSESOPHAGEAL ECHOCARDIOGRAM (TEE);  Surgeon: Dorothy Spark, MD;  Location: [facility] ENDOSCOPY;  Service: Cardiovascular;  Laterality: N/A;     Medications: Current Meds  Medication Sig   aspirin EC 81 MG tablet Take 81 mg by mouth daily.    atorvastatin (LIPITOR) 80 MG tablet Take 1 tablet (80 mg total) by mouth daily at 6 PM.   carvedilol (COREG) 6.25 MG tablet Take 1 tablet (6.25 mg total) by mouth 2 (two) times daily.   clopidogrel (PLAVIX) 75 MG tablet TAKE 1 TABLET BY MOUTH ONCE DAILY   furosemide (LASIX) 40 MG tablet Take 40  mg by mouth daily.   lisinopril (PRINIVIL,ZESTRIL) 20 MG tablet Take 1 tablet (20 mg total) by mouth daily.   metFORMIN (GLUCOPHAGE) 500 MG tablet TAKE 1 TABLET BY MOUTH ONCE DAILY WITH BREAKFAST   pantoprazole (PROTONIX) 40 MG tablet Take 40 mg by mouth daily.    potassium chloride (K-DUR,KLOR-CON) 10 MEQ tablet Take 10 mEq by mouth 2 (two) times daily.    Vitamin D, Ergocalciferol, (DRISDOL) 1.25 MG (50000 UT) CAPS capsule Take 50,000 Units by mouth every 7 (seven) days.      Allergies: No Known Allergies  Social History: The patient  reports that she quit smoking about 11  years ago. She has quit using smokeless tobacco. She reports current alcohol use. She reports that she does not use drugs.   Family History: The patient's family history includes Cancer in her brother and father.   Review of Systems: Please see the history of present illness.   Otherwise, the review of systems is positive for none.   All other systems are reviewed and negative.   Physical Exam: VS:  BP 110/70 (BP Location: Left Arm, Patient Position: Sitting, Cuff Size: Normal)    Pulse 69    Ht _0  (1.702 m)    Wt 153 lb 12.8 oz (69.8 kg)    SpO2 95%    BMI 24.09 kg/m  .  BMI Body mass index is 24.09 kg/m.  Wt Readings from Last 3 Encounters:  01/16/19 153 lb 12.8 oz (69.8 kg)  08/20/18 159 lb (72.1 kg)  07/20/18 156 lb (70.8 kg)    General: Pleasant. Well developed, well nourished and in no acute distress. Weight is down.   HEENT: Normal.  Neck: Supple, no JVD, carotid bruits, or masses noted.  Cardiac: Regular rate and rhythm. No murmurs, rubs, or gallops. No edema.  Respiratory:  Lungs are clear to auscultation bilaterally with normal work of breathing.  GI: Soft and nontender.  MS: No deformity or atrophy. Gait and ROM intact.  Skin: Warm and dry. Color is normal.  Neuro:  Strength and sensation are intact and no gross focal deficits noted.  Psych: Alert, appropriate and with normal affect.   LABORATORY DATA:  EKG:  EKG is not ordered today.   Lab Results  Component Value Date   WBC 7.8 09/06/2017   HGB 12.1 09/06/2017   HCT 35.3 09/06/2017   PLT 304 09/06/2017   GLUCOSE 109 (H) 09/12/2018   CHOL 160 03/07/2018   TRIG 84 03/07/2018   HDL 48 03/07/2018   LDLCALC 95 03/07/2018   NA 144 09/12/2018   K 4.1 09/12/2018   CL 104 09/12/2018   CREATININE 0.83 09/12/2018   BUN 9 09/12/2018   CO2 24 09/12/2018   TSH 2.701 06/20/2017   INR 1.0 09/06/2017   HGBA1C 6.1 (H) 06/18/2017     BNP (last 3 results) No results for input(s): BNP in the last 8760  hours.  ProBNP (last 3 results) No results for input(s): PROBNP in the last 8760 hours.   Other Studies Reviewed Today:  Echo Summary 12/2018 Mildly dilated left ventricle. Moderate to severe global LV hypokinesis. Ejection fraction 28% Normal right ventricle structure and function. Mild mitral regurgitation. Signature ------------------------------------------------------------------------------  Electronically signed by Alinda Money, MD, FACC(Interpreting physician)  Carotid US 06/20/18 Final Interpretation: Right Carotid: Velocities in the right ICA are consistent with a 1-39% stenosis. Left Carotid: Velocities in the left ICA are consistent with a 1-39% stenosis. Vertebrals: Bilateral vertebral arteries demonstrate antegrade  flow.  Transcranial doppler 06/20/18 Final Interpretation: This was a normal transcranial Doppler study, with normal flow direction and velocity of all identified vessels of the anterior and posterior circulations, with no evidence of stenosis  Cardiac Catheterization11/9/18 LAD ost 20 LCx ost 10, OM1 10 EF 25-35  Nuclear stress test10/26/18  The left ventricular ejection fraction is moderately decreased (30-44%).  Nuclear stress EF: 36%.  There was no ST segment deviation noted during stress.  Defect 1: There is a small defect of mild severity present in the apex location.  Defect 2: There is a medium defect of moderate severity present in the basal inferior and mid inferior location. There is ischemia in this distribution  This is a high risk study with reduced ejection fraction and areas of infarct and ischemia identified. Consider cardiac catheterization if stable from stroke perspective.  TEE 06/21/17 Mild conc LVH, EF 30-35, diff HK, mild MR  Echo 06/19/17 Mild LVH, EF 30-35, diff HK, Gr 1 DD, MAC  Assessment/Plan:  1. Acute on chronic systolic HF - NYHA II - recent exacerbation - reminded of need for salt restriction,  daily weights, when to use extra lasix, etc. BMET today.   2. NICM - EF 28% - BP has limited titration of her medicines. She was not able to afford Entresto - I have started a conversation regarding ICD implant - she may consider. Will get her to see Dr. Lovena Le in a few months - will hold on referring now due to current pandemic.   3. HTN - BP is ok. Typically soft - has limited guideline therapy.   4. Prior cryptogenic stroke - has ILR in place - no AF noted.   5. Aortic atherosclerosis (HCC) -  No obstructive CAD. She is on Plavix. CV risk factor modification encouraged.   6. Paraophthalmic artery aneurysm noted on CT in 2018 - she is seeing neurology in May - would defer evaluation to them. Not discussed today.   Current medicines are reviewed with the patient today.  The patient does not have concerns regarding medicines other than what has been noted above.  The following changes have been made:  See above.  Labs/ tests ordered today include:    Orders Placed This Encounter  Procedures   Basic metabolic panel   Ambulatory referral to Cardiac Electrophysiology     Disposition:   FU with [place] in 4 months. I will arrange referral to Dr. Lovena Le to discuss in the next few months (after pandemic passes).   Patient is agreeable to this plan and will call if any problems develop in the interim.   SignedTruitt Merle, NP  01/16/2019 3:14 PM  [facility] [address] [place], [place]  [address] Phone: [phone] Fax: [phone]

## 2019-01-16 NOTE — Patient Instructions (Addendum)
We will be checking the following labs today - BMET   Medication Instructions:    Continue with your current medicines.   It is ok to take an extra dose of your Lasix if your weight goes up 2 or more pounds overnight   If you need a refill on your cardiac medications before your next appointment, please call your pharmacy.     Testing/Procedures To Be Arranged:  N/A  Follow-Up:   See me in about 4 months  We will refer you to Dr. Ladona Ridgel to discuss possible ICD implant.     At [facility], you and your health needs are our priority.  As part of our continuing mission to provide you with exceptional heart care, we have created designated Provider Care Teams.  These Care Teams include your primary Cardiologist (physician) and Advanced Practice Providers (APPs -  Physician Assistants and Nurse Practitioners) who all work together to provide you with the care you need, when you need it.  Special Instructions:  . Restrict your salt . Weigh daily  Call the [facility] office at [phone] if you have any questions, problems or concerns.

## 2019-01-17 LAB — BASIC METABOLIC PANEL
BUN/Creatinine Ratio: 19 (ref 12–28)
BUN: 19 mg/dL (ref 8–27)
CO2: 26 mmol/L (ref 20–29)
Calcium: 10 mg/dL (ref 8.7–10.3)
Chloride: 100 mmol/L (ref 96–106)
Creatinine, Ser: 0.98 mg/dL (ref 0.57–1.00)
GFR calc Af Amer: 68 mL/min/{1.73_m2} (ref >59)
GFR calc non Af Amer: 59 mL/min/{1.73_m2} — ABNORMAL LOW (ref >59)
Glucose: 92 mg/dL (ref 65–99)
Potassium: 4.5 mmol/L (ref 3.5–5.2)
Sodium: 142 mmol/L (ref 134–144)

## 2019-01-21 ENCOUNTER — Other Ambulatory Visit: Payer: Self-pay

## 2019-01-21 ENCOUNTER — Ambulatory Visit (INDEPENDENT_AMBULATORY_CARE_PROVIDER_SITE_OTHER): Payer: Medicare Other | Admitting: *Deleted

## 2019-01-21 DIAGNOSIS — I5022 Chronic systolic (congestive) heart failure: Secondary | ICD-10-CM

## 2019-01-21 DIAGNOSIS — I639 Cerebral infarction, unspecified: Secondary | ICD-10-CM

## 2019-01-22 ENCOUNTER — Telehealth: Payer: Self-pay

## 2019-01-22 LAB — CUP PACEART REMOTE DEVICE CHECK
Date Time Interrogation Session: 20200321224044
Implantable Pulse Generator Implant Date: 20180822

## 2019-01-22 NOTE — Telephone Encounter (Signed)
I spoke with patient and informed her she did not need to send a manual transmission everyday.

## 2019-01-30 NOTE — Progress Notes (Signed)
Carelink Summary Report / Loop Recorder 

## 2019-02-21 ENCOUNTER — Other Ambulatory Visit: Payer: Self-pay

## 2019-02-21 ENCOUNTER — Ambulatory Visit (INDEPENDENT_AMBULATORY_CARE_PROVIDER_SITE_OTHER): Payer: Medicare Other | Admitting: *Deleted

## 2019-02-21 DIAGNOSIS — I639 Cerebral infarction, unspecified: Secondary | ICD-10-CM | POA: Diagnosis not present

## 2019-02-21 LAB — CUP PACEART REMOTE DEVICE CHECK
Date Time Interrogation Session: 20200423221159
Implantable Pulse Generator Implant Date: 20180822

## 2019-03-01 NOTE — Progress Notes (Signed)
Carelink Summary Report / Loop Recorder 

## 2019-03-11 ENCOUNTER — Telehealth: Payer: Self-pay

## 2019-03-11 NOTE — Telephone Encounter (Signed)
I called pt that visit will be video due to COVID 19. I explain we are not doing in office visits due for safety or pt and medical staff. Pt gave verbal consent to do video and to file insurance. Pt has a cell phone with a camera on it. She stated a text will be sent to her phone 5 to 10 minutes prior to visit with a link to click on. I stated after clicking link type in first and last name and click video. Pt verbalized understanding.

## 2019-03-13 ENCOUNTER — Encounter

## 2019-03-13 ENCOUNTER — Encounter: Payer: Self-pay | Admitting: Adult Health

## 2019-03-13 ENCOUNTER — Ambulatory Visit (INDEPENDENT_AMBULATORY_CARE_PROVIDER_SITE_OTHER): Payer: Medicare Other | Admitting: Adult Health

## 2019-03-13 ENCOUNTER — Other Ambulatory Visit: Payer: Self-pay

## 2019-03-13 VITALS — BP 152/64 | HR 67 | Wt 149.0 lb

## 2019-03-13 DIAGNOSIS — I10 Essential (primary) hypertension: Secondary | ICD-10-CM | POA: Diagnosis not present

## 2019-03-13 DIAGNOSIS — E785 Hyperlipidemia, unspecified: Secondary | ICD-10-CM

## 2019-03-13 DIAGNOSIS — I639 Cerebral infarction, unspecified: Secondary | ICD-10-CM | POA: Diagnosis not present

## 2019-03-13 DIAGNOSIS — E119 Type 2 diabetes mellitus without complications: Secondary | ICD-10-CM

## 2019-03-13 NOTE — Progress Notes (Signed)
[facility] [address] [place]. [place] [address] [phone]       FOLLOW-UP NOTE  [name] Date of Birth:  [birth year] Medical Record Number:  [id number]    [facility] [address] [place]. [place] [address] [phone]     Virtual Visit via Telephone Note  I connected with Jerilynn Mages Bingaman on 03/13/19 at  8:45 AM EDT by telephone located remotely within my own home and verified that I am speaking with the correct person using two identifiers who reports being located within her own home.  She was initially scheduled for telemedicine visit via [email or web address] but she was unable to participate in visit through her cell phone and she does not have camera access on computer.  She agreed to transition visit to telephone visit.   Visit scheduled by Sherrie George, RN. She discussed the limitations, risks, security and privacy concerns of performing an evaluation and management service by telephone and the availability of in person appointments. I also discussed with the patient that there may be a patient responsible charge related to this service. The patient expressed understanding and agreed to proceed. See telephone note for consent and additional scheduling information.    HPI:   Elizabeth Bowman is a 68 y.o. female scheduled today for stroke follow-up office visit but due to COVID-19 safety precautions, visit transition to telemedicine via [email or web address] with patient's consent.  Unfortunately had difficulty participating in video visit therefore transitioned to telephone visit with patient's consent.  Underlying medical history significant for diabetes and hypertension who presented to [facility] in 05/2017 with AMS, confusion and aphasia.  Stroke work-up revealed left MCA branch infarct secondary to symptomatic high-grade left MCA stenosis versus cryptogenic with partially recanalized thrombus.  Loop recorder placed for long-term monitoring and rule out  atrial fibrillation.  A1c 6.1 and LDL 116.  Initiated DAPT.  She was discharged home in stable condition.   At prior visit, stable with only mild residual deficits which included occasional word substitution only when tired but otherwise no other deficits noted. This has completely resolved since prior visit.  She denies any residual stroke deficits.  Carotid ultrasound and TCD obtained after prior visit which do not show evidence of significant stenosis.  Loop recorder has not shown atrial fibrillation thus far.  She continues on Plavix alone without side effects of bleeding or bruising.  Continues on atorvastatin without side effects myalgias.  Blood pressure check prior to visit at 152/64 and heart rate 67.  BP slightly elevated compared to patient's normal.  Recent ED admission for shortness of breath and was started on Lasix for CHF with today's weight 149 pounds  (150lb dry weight).  No concerns during today's visit.  Denies new or worsening stroke/TIA symptoms.    OBSERVATION:   General: Pleasant elderly female asking and answering questions appropriately throughout conversation Mental status: Alert and oriented x3.  Fund of knowledge appropriate.  VAS [place] TRANSCRANIAL DOPPLER 06/20/2018 Final Interpretation: This was a normal transcranial Doppler study, with normal flow direction and velocity of all identified vessels of the anterior and posterior circulations, with no evidence of stenosis,   VAS US CAROTID DUPLEX BILATERAL 06/20/2018 Final Interpretation: Right Carotid: Velocities in the right ICA are consistent with a 1-39% stenosis. Left Carotid: Velocities in the left ICA are consistent with a 1-39% stenosis. Vertebrals: Bilateral vertebral arteries demonstrate antegrade flow.    ASSESSMENT: 89 year patient with left MCA branch infarct in August 2018 secondary  to symptomatic high-grade left MCA stenosis versus cryptogenic with partially recanalized hrombus.  Multiple vascular risk  factors of diabetes, hypertension, hyperlipidemia, cardiomyopathy and intracranial stenosis.  She has been stable from a stroke standpoint without residual deficits or reoccurring of symptoms.    PLAN: Continue  Plavix 75 mg daily and atorvastatin for secondary stroke prevention Continue to follow with PCP for HTN, HLD and DM management Continue stay active and maintain a healthy diet Continue to monitor BP at home Continue to monitor loop recorder Maintain BP less than 130/90, LDL less than 70, and A1c less than 7%  Stable from stroke standpoint recommend follow-up as needed  Greater than 50% of time during this 25 minute non-face-to-face telephone visit was spent on counseling,explanation of diagnosis, planning of further management, discussion with patient and family and coordination of care  George Hugh, AGNP-BC  [facility] [address] [place], [place] [address]  Phone [phone] Fax [phone] Note: This document was prepared with digital dictation and possible smart phrase technology. Any transcriptional errors that result from this process are unintentional.

## 2019-03-15 NOTE — Progress Notes (Signed)
I agree with the above plan 

## 2019-03-26 ENCOUNTER — Ambulatory Visit (INDEPENDENT_AMBULATORY_CARE_PROVIDER_SITE_OTHER): Payer: Medicare Other | Admitting: *Deleted

## 2019-03-26 DIAGNOSIS — I639 Cerebral infarction, unspecified: Secondary | ICD-10-CM

## 2019-03-26 DIAGNOSIS — I428 Other cardiomyopathies: Secondary | ICD-10-CM

## 2019-03-27 LAB — CUP PACEART REMOTE DEVICE CHECK
Date Time Interrogation Session: 20200526234013
Implantable Pulse Generator Implant Date: 20180822

## 2019-04-08 NOTE — Progress Notes (Signed)
Carelink Summary Report / Loop Recorder 

## 2019-04-12 ENCOUNTER — Other Ambulatory Visit: Payer: Self-pay | Admitting: Nurse Practitioner

## 2019-04-12 MED ORDER — FUROSEMIDE 40 MG PO TABS: 40.0000 mg | ORAL_TABLET | Freq: Every day | ORAL | 3 refills | Status: DC

## 2019-04-12 MED ORDER — LISINOPRIL 20 MG PO TABS: 20.0000 mg | ORAL_TABLET | Freq: Every day | ORAL | 3 refills | Status: DC

## 2019-04-12 NOTE — Telephone Encounter (Signed)
°*  STAT* If patient is at the pharmacy, call can be transferred to refill team.   1. Which medications need to be refilled? (please list name of each medication and dose if known) furosemide (LASIX) 40 MG tablet  2. Which pharmacy/location (including street and city if local pharmacy) is medication to be sent to? furosemide (LASIX) 40 MG tablet  3. Do they need a 30 day or 90 day supply? 90 days   Patient is out of medication. Please call patient when this is sent to pharmacy

## 2019-04-12 NOTE — Addendum Note (Signed)
Addended by: Derl Barrow on: 04/12/2019 11:45 AM   Modules accepted: Orders

## 2019-04-12 NOTE — Telephone Encounter (Signed)
Pt's medication was sent to pt's pharmacy as requested. Confirmation received.  °

## 2019-04-16 ENCOUNTER — Telehealth: Payer: Self-pay

## 2019-04-16 NOTE — Telephone Encounter (Signed)

## 2019-04-23 ENCOUNTER — Encounter: Payer: Self-pay | Admitting: Cardiology

## 2019-04-23 ENCOUNTER — Other Ambulatory Visit: Payer: Self-pay

## 2019-04-23 ENCOUNTER — Ambulatory Visit: Payer: Medicare Other | Admitting: Internal Medicine

## 2019-04-23 ENCOUNTER — Encounter: Payer: Self-pay | Admitting: Internal Medicine

## 2019-04-23 VITALS — BP 148/80 | HR 59 | Ht 67.0 in | Wt 151.0 lb

## 2019-04-23 DIAGNOSIS — I428 Other cardiomyopathies: Secondary | ICD-10-CM

## 2019-04-23 DIAGNOSIS — I639 Cerebral infarction, unspecified: Secondary | ICD-10-CM | POA: Insufficient documentation

## 2019-04-23 DIAGNOSIS — I5022 Chronic systolic (congestive) heart failure: Secondary | ICD-10-CM

## 2019-04-23 NOTE — Patient Instructions (Signed)
Medication Instructions:  Your physician recommends that you continue on your current medications as directed. Please refer to the Current Medication list given to you today.  Labwork: None ordered.  Testing/Procedures: None ordered.  Follow-Up: Your physician wants you to follow-up in: 3 months with the device clinic for a loop check.   Any Other Special Instructions Will Be Listed Below (If Applicable).  If you need a refill on your cardiac medications before your next appointment, please call your pharmacy.

## 2019-04-23 NOTE — Progress Notes (Signed)
HPI [name] returns today for followup of her remote cryptogenic stroke, s/p ILR insertion. She does not want to continue with monthly monitoring as she does not want to pay for it. She is now almost 2 years out from her neuro event. She has not had atrial fib. She has not had syncope. No pauses were noted on her ILR. She feels well.  No Known Allergies   Current Outpatient Medications  Medication Sig Dispense Refill  . atorvastatin (LIPITOR) 80 MG tablet Take 1 tablet (80 mg total) by mouth daily at 6 PM. 30 tablet 0  . carvedilol (COREG) 6.25 MG tablet Take 1 tablet (6.25 mg total) by mouth 2 (two) times daily. 180 tablet 3  . clopidogrel (PLAVIX) 75 MG tablet TAKE 1 TABLET BY MOUTH ONCE DAILY 90 tablet 3  . furosemide (LASIX) 40 MG tablet Take 1 tablet (40 mg total) by mouth daily. 90 tablet 3  . lisinopril (ZESTRIL) 20 MG tablet Take 1 tablet (20 mg total) by mouth daily. 90 tablet 3  . metFORMIN (GLUCOPHAGE) 500 MG tablet TAKE 1 TABLET BY MOUTH ONCE DAILY WITH BREAKFAST    . pantoprazole (PROTONIX) 40 MG tablet Take 40 mg by mouth daily.     . potassium chloride (K-DUR,KLOR-CON) 10 MEQ tablet Take 10 mEq by mouth 2 (two) times daily.     . Vitamin D, Ergocalciferol, (DRISDOL) 1.25 MG (50000 UT) CAPS capsule Take 50,000 Units by mouth every 7 (seven) days.      No current facility-administered medications for this visit.      Past Medical History:  Diagnosis Date  . Aortic atherosclerosis (HCC) 07/20/2018   Noted on CT in 2018  . Complication of anesthesia   . Diabetes mellitus (HCC)   . Former tobacco use   . HTN (hypertension)   . Hyperlipidemia   . Stroke (HCC)     ROS:   All systems reviewed and negative except as noted in the HPI.   Past Surgical History:  Procedure Laterality Date  . LEFT HEART CATH AND CORONARY ANGIOGRAPHY N/A 09/08/2017   Procedure: LEFT HEART CATH AND CORONARY ANGIOGRAPHY;  Surgeon: [place], Peter M, MD;  Location: [facility] INVASIVE CV LAB;   Service: Cardiovascular;  Laterality: N/A;  . LOOP RECORDER INSERTION N/A 06/21/2017   Procedure: LOOP RECORDER INSERTION;  Surgeon: Marinus Maw, MD;  Location: [facility] INVASIVE CV LAB;  Service: Cardiovascular;  Laterality: N/A;  . TEE WITHOUT CARDIOVERSION N/A 06/21/2017   Procedure: TRANSESOPHAGEAL ECHOCARDIOGRAM (TEE);  Surgeon: Lars Masson, MD;  Location: [facility] ENDOSCOPY;  Service: Cardiovascular;  Laterality: N/A;     Family History  Problem Relation Age of Onset  . Cancer Father   . Cancer Brother   . CAD Neg Hx   . Heart attack Neg Hx   . Congestive Heart Failure Neg Hx      Social History   Socioeconomic History  . Marital status: Married    Spouse name: Not on file  . Number of children: Not on file  . Years of education: Not on file  . Highest education level: Not on file  Occupational History  . Not on file  Social Needs  . Financial resource strain: Not on file  . Food insecurity    Worry: Not on file    Inability: Not on file  . Transportation needs    Medical: Not on file    Non-medical: Not on file  Tobacco Use  . Smoking  status: Former Smoker    Quit date: 06/19/2007    Years since quitting: 11.8  . Smokeless tobacco: Former Systems developer  . Tobacco comment: Smoked for 20-30 years  Substance and Sexual Activity  . Alcohol use: Yes    Comment: occasional drink a few times a month, not regularly  . Drug use: No  . Sexual activity: Yes    Partners: Female  Lifestyle  . Physical activity    Days per week: Not on file    Minutes per session: Not on file  . Stress: Not on file  Relationships  . Social Herbalist on phone: Not on file    Gets together: Not on file    Attends religious service: Not on file    Active member of club or organization: Not on file    Attends meetings of clubs or organizations: Not on file    Relationship status: Not on file  . Intimate partner violence    Fear of current or ex partner: Not on file    Emotionally  abused: Not on file    Physically abused: Not on file    Forced sexual activity: Not on file  Other Topics Concern  . Not on file  Social History Narrative  . Not on file     BP (!) 148/80   Pulse (!) 59   Ht 5\' 7"  (1.702 m)   Wt 151 lb (68.5 kg)   SpO2 98%   BMI 23.65 kg/m   Physical Exam:  Well appearing 68 yo woman, NAD HEENT: Unremarkable Neck:  6 cm JVD, no thyromegally Lymphatics:  No adenopathy Back:  No CVA tenderness Lungs:  No increased work of breathing HEART:  Regular rate and rhythm Abd:  soft, positive bowel sounds, no organomegally, no rebound, no guarding Ext:  2 plus pulses, no edema, no cyanosis, no clubbing Skin:  No rashes no nodules Neuro:  CN II through XII intact, motor grossly intact  EKG - nsr  DEVICE  Normal device function.  See PaceArt for details.   Assess/Plan: 1. Cryptogenic stroke - she will continue watchful waiting. She will stop monthly monitoring and we will have her come in to be checked every 3 months. She understands that by taking this approach, she could develop atrial fib, not know it and have a stroke before we were able to alert her.   2. HTN - her SBP is up a bit today. We will follow. She will maintain a low sodium diet. Mikle Bosworth.D.

## 2019-04-24 ENCOUNTER — Telehealth: Payer: Self-pay

## 2019-04-24 NOTE — Telephone Encounter (Signed)
Spoke to pt husband, pt not at home. Will call back this afternoon to get pt schedule for 3 month f/u in [place].

## 2019-04-25 ENCOUNTER — Encounter: Payer: Self-pay | Admitting: Internal Medicine

## 2019-04-25 LAB — CUP PACEART INCLINIC DEVICE CHECK
Date Time Interrogation Session: 20200625085508
Implantable Pulse Generator Implant Date: 20180822

## 2019-04-25 NOTE — Telephone Encounter (Signed)
error 

## 2019-05-10 ENCOUNTER — Telehealth: Payer: Self-pay | Admitting: Nurse Practitioner

## 2019-05-10 NOTE — Telephone Encounter (Signed)

## 2019-05-11 NOTE — Progress Notes (Signed)
CARDIOLOGY OFFICE NOTE  Date:  05/13/2019    Elizabeth Bowman Date of Birth: [birth year] Medical Record #[id number]  PCP:  Concepcion Elk, MD  Cardiologist:  Servando Snare & Skains/Taylor   Chief Complaint  Patient presents with  . Cardiomyopathy  . Congestive Heart Failure    Follow up visit.     History of Present Illness: Elizabeth Bowman is a 68 y.o. female who presents today for a follow up visit. Seen for Dr. Marlou Porch & Lovena Le.   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 The patient was last seen in clinic by Dr. Marlou Porch in July 2019. Her heart failure regimen was adjusted by Entresto.  Seen by Richardson Dopp, PA back in September of 2019 - she was not able to afford Entresto - thus stayed on Lisinopril. Then ran out and had no refills. She was only taking Coreg once a day as well. She had cut that back previously due to BP running low.  I saw her back in October of 2019 - she was felt to be doing ok.   She was admitted back in March of 2020 at [facility] - presented with shortness of breath - found to be hypoxic and had respiratory failure - given Bipap and diuresed. Demand ischemia noted. She was sent home on Lasix. Echo was updated - EF of 28%. Her BP has typically limited guideline therapy - she was to see EP and discuss possible ICD.   She was feeling better when I saw her in March - unclear trigger for her CHF exacerbation but I suspected sodium indiscretion. She has followed with EP about her ILR - does not wish to do remote monitoring and will be coming in to the device clinic every 3 months. I do not see a discussion about ICD implant.   The patient does not have  symptoms concerning for COVID-19 infection (fever, chills, cough, or new shortness of breath).   Comes in today. Here alone. She is doing ok. Fatigues easily. Breathing is ok. She will hold her dose of Lasix if weight is down below 148. She tries to stay around 150#. No chest pain. She is asking about an over the counter supplement for memory - she notes her husband says she is more forgetful. Seeing PCP next week.  No bleeding/bruising noted.   Past Medical History:  Diagnosis Date  . Aortic atherosclerosis ([place]) 07/20/2018   Noted on CT in 2018  . Complication of anesthesia   . Diabetes mellitus ([place])   . Former tobacco use   . HTN (hypertension)   . Hyperlipidemia   . Stroke [facility])     Past Surgical History:  Procedure Laterality Date  . LEFT HEART CATH AND CORONARY ANGIOGRAPHY N/A 09/08/2017   Procedure: LEFT HEART CATH AND CORONARY ANGIOGRAPHY;  Surgeon: Martinique, Peter M, MD;  Location: [place] CV LAB;  Service: Cardiovascular;  Laterality: N/A;  . LOOP RECORDER INSERTION N/A 06/21/2017   Procedure: LOOP RECORDER INSERTION;  Surgeon: Evans Lance, MD;  Location: [place] CV LAB;  Service: Cardiovascular;  Laterality: N/A;  . TEE WITHOUT CARDIOVERSION N/A 06/21/2017   Procedure: TRANSESOPHAGEAL ECHOCARDIOGRAM (TEE);  Surgeon: Dorothy Spark, MD;  Location: [place];  Service: Cardiovascular;  Laterality: N/A;  Medications: Current Meds  Medication Sig  . atorvastatin (LIPITOR) 80 MG tablet Take 1 tablet (80 mg total) by mouth daily at 6 PM.  . carvedilol (COREG) 6.25 MG tablet Take 1 tablet (6.25 mg total) by mouth 2 (two) times daily.  . clopidogrel (PLAVIX) 75 MG tablet TAKE 1 TABLET BY MOUTH ONCE DAILY  . furosemide (LASIX) 40 MG tablet Take 1 tablet (40 mg total) by mouth daily.  Marland Kitchen lisinopril (ZESTRIL) 20 MG tablet Take 1 tablet (20 mg total) by mouth daily.  . metFORMIN (GLUCOPHAGE) 500 MG tablet TAKE 1 TABLET BY MOUTH ONCE DAILY WITH BREAKFAST  .  pantoprazole (PROTONIX) 40 MG tablet Take 40 mg by mouth daily.   . potassium chloride (K-DUR,KLOR-CON) 10 MEQ tablet Take 10 mEq by mouth 2 (two) times daily.   . Vitamin D, Ergocalciferol, (DRISDOL) 1.25 MG (50000 UT) CAPS capsule Take 50,000 Units by mouth every 7 (seven) days.      Allergies: No Known Allergies  Social History: The patient  reports that she quit smoking about 11 years ago. She has quit using smokeless tobacco. She reports current alcohol use. She reports that she does not use drugs.   Family History: The patient's family history includes Cancer in her brother and father.   Review of Systems: Please see the history of present illness.   All other systems are reviewed and negative.   Physical Exam: VS:  BP 130/76   Pulse 74   Ht 5' 8.4" (1.737 m)   Wt 149 lb 12.8 oz (67.9 kg)   SpO2 99%   BMI 22.51 kg/m  .  BMI Body mass index is 22.51 kg/m.  Wt Readings from Last 3 Encounters:  05/13/19 149 lb 12.8 oz (67.9 kg)  04/23/19 151 lb (68.5 kg)  03/13/19 149 lb (67.6 kg)    General: Pleasant. Well developed, well nourished and in no acute distress.  Weight is down from 153 from my last visit.  HEENT: Normal.  Neck: Supple, no JVD, carotid bruits, or masses noted.  Cardiac: Regular rate and rhythm. No murmurs, rubs, or gallops. No edema.  Respiratory:  Lungs are clear to auscultation bilaterally with normal work of breathing.  GI: Soft and nontender.  MS: No deformity or atrophy. Gait and ROM intact.  Skin: Warm and dry. Color is normal.  Neuro:  Strength and sensation are intact and no gross focal deficits noted.  Psych: Alert, appropriate and with normal affect.   LABORATORY DATA:  EKG:  EKG is not ordered today.   Lab Results  Component Value Date   WBC 7.8 09/06/2017   HGB 12.1 09/06/2017   HCT 35.3 09/06/2017   PLT 304 09/06/2017   GLUCOSE 92 01/16/2019   CHOL 160 03/07/2018   TRIG 84 03/07/2018   HDL 48 03/07/2018   LDLCALC 95 03/07/2018    NA 142 01/16/2019   K 4.5 01/16/2019   CL 100 01/16/2019   CREATININE 0.98 01/16/2019   BUN 19 01/16/2019   CO2 26 01/16/2019   TSH 2.701 06/20/2017   INR 1.0 09/06/2017   HGBA1C 6.1 (H) 06/18/2017     BNP (last 3 results) No results for input(s): BNP in the last 8760 hours.  ProBNP (last 3 results) No results for input(s): PROBNP in the last 8760 hours.   Other Studies Reviewed Today:  Echo Summary 12/2018 Mildly dilated left ventricle. Moderate to severe global LV hypokinesis. Ejection fraction 28% Normal right ventricle structure and function. Mild mitral regurgitation. Signature ------------------------------------------------------------------------------  Electronically signed by Alinda Money, MD, FACC(Interpreting physician)  Carotid US 06/20/18 Final Interpretation: Right Carotid: Velocities in the right ICA are consistent with a 1-39% stenosis. Left Carotid: Velocities in the left ICA are consistent with a 1-39% stenosis. Vertebrals: Bilateral vertebral arteries demonstrate antegrade flow.  Transcranial doppler 06/20/18 Final Interpretation: This was a normal transcranial Doppler study, with normal flow direction and velocity of all identified vessels of the anterior and posterior circulations, with no evidence of stenosis  Cardiac Catheterization11/9/18 LAD ost 20 LCx ost 10, OM1 10 EF 25-35  Nuclear stress test10/26/18  The left ventricular ejection fraction is moderately decreased (30-44%).  Nuclear stress EF: 36%.  There was no ST segment deviation noted during stress.  Defect 1: There is a small defect of mild severity present in the apex location.  Defect 2: There is a medium defect of moderate severity present in the basal inferior and mid inferior location. There is ischemia in this distribution  This is a high risk study with reduced ejection fraction and areas of infarct and ischemia identified. Consider cardiac catheterization  if stable from stroke perspective.  TEE 06/21/17 Mild conc LVH, EF 30-35, diff HK, mild MR  Echo 06/19/17 Mild LVH, EF 30-35, diff HK, Gr 1 DD, MAC  Assessment/Plan:  1. Chronic systolic HF - NYHA II - stable on current regimen. She is adjusting her diuretic based on her weight. On limited regimen due to soft BP.   2. NICM - EF 28% - BP has typically limited titration of her medicines. She was not able to afford [name] - we talked about ICD implant at our last visit - have discussed again today. She is to see EP again in October.   3. HTN - BP is ok. Typically soft - has limited guideline therapy. Unchanged.   4. Prior cryptogenic stroke - has ILR in place - no AF noted. She has stopped remote monitoring due to cost - coming to device clinic every 3 months.    5.Aortic atherosclerosis ([place])- No obstructive CAD. She is on Plavix. CV risk factor modification encouraged.   6. Paraophthalmic artery aneurysm noted on CT in 2018- she has had a recent telehealth visit with neurology - has been released from their care.  Unclear to me what is needed going forward.   7. Memory issues - she will discuss with PCP  8. COVID-19 Education: The signs and symptoms of COVID-19 were discussed with the patient and how to seek care for testing (follow up with PCP or arrange E-visit).  The importance of social distancing, staying at home, hand hygiene and wearing a mask when out in public were discussed today.  Current medicines are reviewed with the patient today.  The patient does not have concerns regarding medicines other than what has been noted above.  The following changes have been made:  See above.  Labs/ tests ordered today include:    Orders Placed This Encounter  Procedures  . Basic metabolic panel  . CBC     Disposition:   FU with Dr. Jennell Corner in October - I will see back in December.    Patient is agreeable to this plan and will call if any problems develop in  the interim.   SignedTruitt Merle, NP  05/13/2019 2:52 PM  [facility] [address] [place], [place]  [address] Phone: [phone] Fax: [phone]

## 2019-05-13 ENCOUNTER — Ambulatory Visit (INDEPENDENT_AMBULATORY_CARE_PROVIDER_SITE_OTHER): Payer: Medicare Other | Admitting: Nurse Practitioner

## 2019-05-13 ENCOUNTER — Other Ambulatory Visit: Payer: Self-pay

## 2019-05-13 ENCOUNTER — Encounter: Payer: Self-pay | Admitting: Nurse Practitioner

## 2019-05-13 VITALS — BP 130/76 | HR 74 | Ht 68.4 in | Wt 149.8 lb

## 2019-05-13 DIAGNOSIS — I639 Cerebral infarction, unspecified: Secondary | ICD-10-CM

## 2019-05-13 DIAGNOSIS — I428 Other cardiomyopathies: Secondary | ICD-10-CM

## 2019-05-13 DIAGNOSIS — Z79899 Other long term (current) drug therapy: Secondary | ICD-10-CM | POA: Diagnosis not present

## 2019-05-13 DIAGNOSIS — E785 Hyperlipidemia, unspecified: Secondary | ICD-10-CM

## 2019-05-13 DIAGNOSIS — I5022 Chronic systolic (congestive) heart failure: Secondary | ICD-10-CM

## 2019-05-13 NOTE — Patient Instructions (Addendum)
After Visit Summary:  We will be checking the following labs today - BMET & CBC   Medication Instructions:    Continue with your current medicines.    If you need a refill on your cardiac medications before your next appointment, please call your pharmacy.     Testing/Procedures To Be Arranged:  N/A  Follow-Up:   See me in December    At [facility], you and your health needs are our priority.  As part of our continuing mission to provide you with exceptional heart care, we have created designated Provider Care Teams.  These Care Teams include your primary Cardiologist (physician) and Advanced Practice Providers (APPs -  Physician Assistants and Nurse Practitioners) who all work together to provide you with the care you need, when you need it.  Special Instructions:  . Stay safe, stay home, wash your hands for at least 20 seconds and wear a mask when out in public.  . It was good to talk with you today.  . Talk with your PCP about the issues with your memory   Call the [place] office at [phone] if you have any questions, problems or concerns.

## 2019-05-14 ENCOUNTER — Telehealth: Payer: Self-pay | Admitting: Adult Health

## 2019-05-14 ENCOUNTER — Other Ambulatory Visit: Payer: Self-pay | Admitting: Adult Health

## 2019-05-14 DIAGNOSIS — I671 Cerebral aneurysm, nonruptured: Secondary | ICD-10-CM

## 2019-05-14 LAB — CBC
Hematocrit: 35.9 % (ref 34.0–46.6)
Hemoglobin: 12 g/dL (ref 11.1–15.9)
MCH: 34.2 pg — ABNORMAL HIGH (ref 26.6–33.0)
MCHC: 33.4 g/dL (ref 31.5–35.7)
MCV: 102 fL — ABNORMAL HIGH (ref 79–97)
Platelets: 289 10*3/uL (ref 150–450)
RBC: 3.51 x10E6/uL — ABNORMAL LOW (ref 3.77–5.28)
RDW: 12.8 % (ref 11.7–15.4)
WBC: 6 10*3/uL (ref 3.4–10.8)

## 2019-05-14 LAB — BASIC METABOLIC PANEL
BUN/Creatinine Ratio: 15 (ref 12–28)
BUN: 12 mg/dL (ref 8–27)
CO2: 25 mmol/L (ref 20–29)
Calcium: 9.9 mg/dL (ref 8.7–10.3)
Chloride: 102 mmol/L (ref 96–106)
Creatinine, Ser: 0.78 mg/dL (ref 0.57–1.00)
GFR calc Af Amer: 90 mL/min/{1.73_m2} (ref >59)
GFR calc non Af Amer: 78 mL/min/{1.73_m2} (ref >59)
Glucose: 116 mg/dL — ABNORMAL HIGH (ref 65–99)
Potassium: 4 mmol/L (ref 3.5–5.2)
Sodium: 146 mmol/L — ABNORMAL HIGH (ref 134–144)

## 2019-05-14 NOTE — Progress Notes (Signed)
I called pt and relayed that test CTA ordered to f/u aneurysm from previous img 2018.  Pt ok with this.  I relayed that her kidney function will be checked first prior to test, the if ok to proceed will have IV when in for testing CTA w/wo contrast.  She verbalized understanding.  She will call back if questions.

## 2019-05-14 NOTE — Telephone Encounter (Signed)
[employer] medicare Josem Kaufmann: [id number] (exp. 05/14/19 to 11/09/19) order sent to GI. They will obtain the auth and reach out to the patient ot schedule.

## 2019-05-14 NOTE — Progress Notes (Signed)
CT angio head for follow up regarding bilateral paraophthalmic aneurysm as previously seen on imaging.

## 2019-05-14 NOTE — Progress Notes (Signed)
Thank you so much!!  Elizabeth Bowman

## 2019-05-14 NOTE — Progress Notes (Signed)
Recommend that she see neurology once again to specifically address this issue.  Thank you. Candee Furbish, MD

## 2019-07-03 ENCOUNTER — Emergency Department (HOSPITAL_BASED_OUTPATIENT_CLINIC_OR_DEPARTMENT_OTHER): Payer: Medicare Other

## 2019-07-03 ENCOUNTER — Encounter (HOSPITAL_BASED_OUTPATIENT_CLINIC_OR_DEPARTMENT_OTHER): Payer: Self-pay | Admitting: Emergency Medicine

## 2019-07-03 ENCOUNTER — Emergency Department (HOSPITAL_BASED_OUTPATIENT_CLINIC_OR_DEPARTMENT_OTHER)
Admission: EM | Admit: 2019-07-03 | Discharge: 2019-07-03 | Disposition: A | Discharge status: Home / Self Care | Payer: Medicare Other | Attending: Emergency Medicine | Admitting: Emergency Medicine

## 2019-07-03 ENCOUNTER — Other Ambulatory Visit: Payer: Self-pay

## 2019-07-03 DIAGNOSIS — Z7984 Long term (current) use of oral hypoglycemic drugs: Secondary | ICD-10-CM | POA: Insufficient documentation

## 2019-07-03 DIAGNOSIS — Z87891 Personal history of nicotine dependence: Secondary | ICD-10-CM | POA: Insufficient documentation

## 2019-07-03 DIAGNOSIS — Z20828 Contact with and (suspected) exposure to other viral communicable diseases: Secondary | ICD-10-CM | POA: Insufficient documentation

## 2019-07-03 DIAGNOSIS — J209 Acute bronchitis, unspecified: Secondary | ICD-10-CM | POA: Insufficient documentation

## 2019-07-03 DIAGNOSIS — I110 Hypertensive heart disease with heart failure: Secondary | ICD-10-CM | POA: Insufficient documentation

## 2019-07-03 DIAGNOSIS — I5022 Chronic systolic (congestive) heart failure: Secondary | ICD-10-CM | POA: Diagnosis not present

## 2019-07-03 DIAGNOSIS — E119 Type 2 diabetes mellitus without complications: Secondary | ICD-10-CM | POA: Insufficient documentation

## 2019-07-03 DIAGNOSIS — R05 Cough: Secondary | ICD-10-CM | POA: Diagnosis present

## 2019-07-03 DIAGNOSIS — Z20822 Contact with and (suspected) exposure to covid-19: Secondary | ICD-10-CM

## 2019-07-03 LAB — CBC WITH DIFFERENTIAL/PLATELET
Abs Immature Granulocytes: 0.02 10*3/uL (ref 0.00–0.07)
Basophils Absolute: 0 10*3/uL (ref 0.0–0.1)
Basophils Relative: 1 %
Eosinophils Absolute: 0.1 10*3/uL (ref 0.0–0.5)
Eosinophils Relative: 1 %
HCT: 36.3 % (ref 36.0–46.0)
Hemoglobin: 11.8 g/dL — ABNORMAL LOW (ref 12.0–15.0)
Immature Granulocytes: 0 %
Lymphocytes Relative: 29 %
Lymphs Abs: 1.8 10*3/uL (ref 0.7–4.0)
MCH: 32.8 pg (ref 26.0–34.0)
MCHC: 32.5 g/dL (ref 30.0–36.0)
MCV: 100.8 fL — ABNORMAL HIGH (ref 80.0–100.0)
Monocytes Absolute: 0.7 10*3/uL (ref 0.1–1.0)
Monocytes Relative: 12 %
Neutro Abs: 3.6 10*3/uL (ref 1.7–7.7)
Neutrophils Relative %: 57 %
Platelets: 231 10*3/uL (ref 150–400)
RBC: 3.6 MIL/uL — ABNORMAL LOW (ref 3.87–5.11)
RDW: 11.8 % (ref 11.5–15.5)
WBC: 6.3 10*3/uL (ref 4.0–10.5)
nRBC: 0 % (ref 0.0–0.2)

## 2019-07-03 LAB — BASIC METABOLIC PANEL
Anion gap: 11 (ref 5–15)
BUN: 11 mg/dL (ref 8–23)
CO2: 28 mmol/L (ref 22–32)
Calcium: 9.2 mg/dL (ref 8.9–10.3)
Chloride: 102 mmol/L (ref 98–111)
Creatinine, Ser: 0.88 mg/dL (ref 0.44–1.00)
GFR calc Af Amer: >60 mL/min (ref >60)
GFR calc non Af Amer: >60 mL/min (ref >60)
Glucose, Bld: 112 mg/dL — ABNORMAL HIGH (ref 70–99)
Potassium: 3.2 mmol/L — ABNORMAL LOW (ref 3.5–5.1)
Sodium: 141 mmol/L (ref 135–145)

## 2019-07-03 LAB — SARS CORONAVIRUS 2 BY RT PCR (HOSPITAL ORDER, PERFORMED IN ~~LOC~~ HOSPITAL LAB): SARS Coronavirus 2: NEGATIVE

## 2019-07-03 LAB — BRAIN NATRIURETIC PEPTIDE: B Natriuretic Peptide: 81.9 pg/mL (ref 0.0–100.0)

## 2019-07-03 MED ORDER — DOXYCYCLINE HYCLATE 100 MG PO CAPS: 100.0000 mg | ORAL_CAPSULE | Freq: Two times a day (BID) | ORAL | 0 refills | Status: DC

## 2019-07-03 MED ORDER — AEROCHAMBER PLUS FLO-VU MEDIUM MISC
1.0000 | Freq: Once | Status: AC
  Administered 2019-07-03: 13:00:00 1
  Filled 2019-07-03: qty 1

## 2019-07-03 MED ORDER — PREDNISONE 10 MG (21) PO TBPK: ORAL_TABLET | ORAL | 0 refills | Status: DC

## 2019-07-03 MED ORDER — DOXYCYCLINE HYCLATE 100 MG PO TABS
100.0000 mg | ORAL_TABLET | Freq: Once | ORAL | Status: AC
  Administered 2019-07-03: 100 mg via ORAL
  Filled 2019-07-03: qty 1

## 2019-07-03 MED ORDER — DEXAMETHASONE SODIUM PHOSPHATE 10 MG/ML IJ SOLN
10.0000 mg | Freq: Once | INTRAMUSCULAR | Status: AC
  Administered 2019-07-03: 10 mg via INTRAVENOUS
  Filled 2019-07-03: qty 1

## 2019-07-03 MED ORDER — ALBUTEROL SULFATE HFA 108 (90 BASE) MCG/ACT IN AERS
4.0000 | INHALATION_SPRAY | Freq: Once | RESPIRATORY_TRACT | Status: AC
  Administered 2019-07-03: 13:00:00 4 via RESPIRATORY_TRACT
  Filled 2019-07-03: qty 6.7

## 2019-07-03 NOTE — ED Provider Notes (Signed)
[facility] EMERGENCY DEPARTMENT Provider Note   CSN: [id number] Arrival date & time: 07/03/19  1240     History   Chief Complaint Chief Complaint  Patient presents with  . Cough    HPI Elizabeth Bowman is a 68 y.o. female.     Pt presents to the ED today with cough and sob.  She has been sick since August 28.  She has been taking Robitussin DM, but that has not been helping.  No f/c.  No cp.  No known covid exposures.  She does have a hx of CHF with dilated DM, EF 30%, but no weight gain or leg swelling.  She has been compliant with meds.     Past Medical History:  Diagnosis Date  . Aortic atherosclerosis ([facility]) 07/20/2018   Noted on CT in 2018  . Complication of anesthesia   . Diabetes mellitus ([facility])   . Former tobacco use   . HTN (hypertension)   . Hyperlipidemia   . Stroke [facility])     Patient Active Problem List   Diagnosis Date Noted  . Cryptogenic stroke ([facility]) 04/23/2019  . Chronic systolic CHF (congestive heart failure) ([facility]) 07/20/2018  . Aortic atherosclerosis ([facility]) 07/20/2018  . Essential hypertension 07/20/2018  . Nonischemic cardiomyopathy ([facility]) 09/08/2017  . Abnormal nuclear stress test 09/08/2017  . Aphasia   . History of stroke 06/18/2017  . Diabetes mellitus without complication ([facility]) 06/18/2017  . Diabetes mellitus type 2 in nonobese ([facility]) 06/18/2017  . HLD (hyperlipidemia) 06/18/2017    Past Surgical History:  Procedure Laterality Date  . LEFT HEART CATH AND CORONARY ANGIOGRAPHY N/A 09/08/2017   Procedure: LEFT HEART CATH AND CORONARY ANGIOGRAPHY;  Surgeon: SwazilandJordan, Peter M, MD;  [facility];  Service: Cardiovascular;  Laterality: N/A;  . LOOP RECORDER INSERTION N/A 06/21/2017   Procedure: LOOP RECORDER INSERTION;  Surgeon: Marinus Mawaylor, Gregg W, MD;  Location: [facility] INVASIVE CV LAB;  Service: Cardiovascular;  Laterality: N/A;  . TEE WITHOUT CARDIOVERSION N/A 06/21/2017   Procedure: TRANSESOPHAGEAL ECHOCARDIOGRAM (TEE);  Surgeon:  Lars MassonNelson, Katarina H, MD;  [facility];  Service: Cardiovascular;  Laterality: N/A;     OB History   No obstetric history on file.      Home Medications    Prior to Admission medications   Medication Sig Start Date End Date Taking? Authorizing Provider  atorvastatin (LIPITOR) 80 MG tablet Take 1 tablet (80 mg total) by mouth daily at 6 PM. 06/21/17   Edsel PetrinMikhail, Maryann, DO  carvedilol (COREG) 6.25 MG tablet Take 1 tablet (6.25 mg total) by mouth 2 (two) times daily. 07/20/18   Tereso NewcomerWeaver, Scott T, PA-C  clopidogrel (PLAVIX) 75 MG tablet TAKE 1 TABLET BY MOUTH ONCE DAILY 08/07/18   Rosalio MacadamiaGerhardt, Lori C, NP  doxycycline (VIBRAMYCIN) 100 MG capsule Take 1 capsule (100 mg total) by mouth 2 (two) times daily. 07/03/19   Jacalyn LefevreHaviland, Sheryll Dymek, MD  furosemide (LASIX) 40 MG tablet Take 1 tablet (40 mg total) by mouth daily. 04/12/19   Rosalio MacadamiaGerhardt, Lori C, NP  lisinopril (ZESTRIL) 20 MG tablet Take 1 tablet (20 mg total) by mouth daily. 04/12/19   Rosalio MacadamiaGerhardt, Lori C, NP  metFORMIN (GLUCOPHAGE) 500 MG tablet TAKE 1 TABLET BY MOUTH ONCE DAILY WITH BREAKFAST 03/19/18   [provider]  pantoprazole (PROTONIX) 40 MG tablet Take 40 mg by mouth daily.  10/17/18   [provider]  potassium chloride (K-DUR,KLOR-CON) 10 MEQ tablet Take 10 mEq by mouth 2 (two) times daily.  01/14/19   [provider]  predniSONE (STERAPRED UNI-PAK 21 TAB) 10 MG (21) TBPK tablet Take 6 tabs for 2 days, then 5 for 2 days, then 4 for 2 days, then 3 for 2 days, 2 for 2 days, then 1 for 2 days 07/03/19   Isla Pence, MD  Vitamin D, Ergocalciferol, (DRISDOL) 1.25 MG (50000 UT) CAPS capsule Take 50,000 Units by mouth every 7 (seven) days.  10/03/18   [provider]    Family History Family History  Problem Relation Age of Onset  . Cancer Father   . Cancer Brother   . CAD Neg Hx   . Heart attack Neg Hx   . Congestive Heart Failure Neg Hx     Social History Social History   Tobacco Use  . Smoking  status: Former Smoker    Quit date: 06/19/2007    Years since quitting: 12.0  . Smokeless tobacco: Former Systems developer  . Tobacco comment: Smoked for 20-30 years  Substance Use Topics  . Alcohol use: Yes    Comment: occasional drink a few times a month, not regularly  . Drug use: No     Allergies   Patient has no known allergies.   Review of Systems Review of Systems  Respiratory: Positive for cough.   All other systems reviewed and are negative.    Physical Exam Updated Vital Signs BP (!) 176/82 (BP Location: Right Arm)   Pulse 78   Temp 98.7 F (37.1 C) (Oral)   Resp 16   Ht 5\' 7"  (1.702 m)   Wt 68 kg   SpO2 99%   BMI 23.49 kg/m   Physical Exam Vitals signs and nursing note reviewed.  Constitutional:      Appearance: Normal appearance.  HENT:     Head: Normocephalic and atraumatic.     Right Ear: External ear normal.     Left Ear: External ear normal.     Nose: Nose normal.     Mouth/Throat:     Mouth: Mucous membranes are moist.     Pharynx: Oropharynx is clear.  Eyes:     Extraocular Movements: Extraocular movements intact.     Conjunctiva/sclera: Conjunctivae normal.     Pupils: Pupils are equal, round, and reactive to light.  Neck:     Musculoskeletal: Normal range of motion and neck supple.  Cardiovascular:     Rate and Rhythm: Normal rate and regular rhythm.     Pulses: Normal pulses.     Heart sounds: Normal heart sounds.  Pulmonary:     Effort: Pulmonary effort is normal.     Breath sounds: Normal breath sounds.  Abdominal:     General: Abdomen is flat. Bowel sounds are normal.     Palpations: Abdomen is soft.  Musculoskeletal: Normal range of motion.  Skin:    General: Skin is warm.     Capillary Refill: Capillary refill takes less than 2 seconds.  Neurological:     General: No focal deficit present.     Mental Status: She is alert and oriented to person, place, and time.  Psychiatric:        Mood and Affect: Mood normal.        Behavior:  Behavior normal.        Thought Content: Thought content normal.        Judgment: Judgment normal.      ED Treatments / Results  Labs (all labs ordered are listed, but only abnormal results are displayed)  Labs Reviewed  BASIC METABOLIC PANEL - Abnormal; Notable for the following components:      Result Value   Potassium 3.2 (*)    Glucose, Bld 112 (*)    All other components within normal limits  CBC WITH DIFFERENTIAL/PLATELET - Abnormal; Notable for the following components:   RBC 3.60 (*)    Hemoglobin 11.8 (*)    MCV 100.8 (*)    All other components within normal limits  SARS CORONAVIRUS 2 (HOSPITAL ORDER, PERFORMED IN  HOSPITAL LAB)  BRAIN NATRIURETIC PEPTIDE    EKG EKG Interpretation  Date/Time:  Wednesday July 03 2019 13:14:09 EDT Ventricular Rate:  66 PR Interval:    QRS Duration: 97 QT Interval:  417 QTC Calculation: 437 R Axis:   63 Text Interpretation:  Sinus rhythm Probable left ventricular hypertrophy Anterior Q waves, possibly due to LVH Minimal ST elevation, inferior leads No significant change since last tracing Confirmed by Jacalyn LefevreHaviland, Cosima Prentiss [phone]([id number]) on 07/03/2019 1:38:11 PM   Radiology Dg Chest Port 1 View  Result Date: 07/03/2019 CLINICAL DATA:  Shortness of breath and cough. EXAM: PORTABLE CHEST 1 VIEW COMPARISON:  June 17, 2017 FINDINGS: Cardiomediastinal silhouette is normal. Mediastinal contours appear intact. Loop recorder present. Calcific atherosclerotic disease and tortuosity of the aorta. There is no evidence of focal airspace consolidation, pleural effusion or pneumothorax. Osseous structures are without acute abnormality. Soft tissues are grossly normal. IMPRESSION: No active disease. Electronically Signed   By: Ted Mcalpineobrinka  Dimitrova M.D.   On: 07/03/2019 13:34    Procedures Procedures (including critical care time)  Medications Ordered in ED Medications  dexamethasone (DECADRON) injection 10 mg (has no administration in time  range)  doxycycline (VIBRA-TABS) tablet 100 mg (has no administration in time range)  albuterol (VENTOLIN HFA) 108 (90 Base) MCG/ACT inhaler 4 puff (4 puffs Inhalation Given 07/03/19 1323)  AeroChamber Plus Flo-Vu Medium MISC 1 each (1 each Other Given 07/03/19 1323)     Initial Impression / Assessment and Plan / ED Course  I have reviewed the triage vital signs and the nursing notes.  Pertinent labs & imaging results that were available during my care of the patient were reviewed by me and considered in my medical decision making (see chart for details).       Pt is feeling much better after albuterol.  She will also be given a dose of decadron.  She does not have pna or covid.  She is d/c home with doxy and prednisone.  She is oxygenating well and does not look toxic.  Return if worse.  F/u with pcp.  Elizabeth Bowman was evaluated in Emergency Department on 07/03/2019 for the symptoms described in the history of present illness. She was evaluated in the context of the global COVID-19 pandemic, which necessitated consideration that the patient might be at risk for infection with the SARS-CoV-2 virus that causes COVID-19. Institutional protocols and algorithms that pertain to the evaluation of patients at risk for COVID-19 are in a state of rapid change based on information released by regulatory bodies including the CDC and federal and state organizations. These policies and algorithms were followed during the patient's care in the ED.  Final Clinical Impressions(s) / ED Diagnoses   Final diagnoses:  Acute bronchitis, unspecified organism  Covid-19 Virus not Detected    ED Discharge Orders         Ordered    doxycycline (VIBRAMYCIN) 100 MG capsule  2 times daily     07/03/19 1440  predniSONE (STERAPRED UNI-PAK 21 TAB) 10 MG (21) TBPK tablet     07/03/19 1440           Jacalyn Lefevre, MD 07/03/19 1441

## 2019-07-03 NOTE — ED Triage Notes (Signed)
Pt reports cough x 4 days, denies fever , shortness of breath. Also reports weakness. Hx CHF. Alert and oriented x 4.

## 2019-08-06 ENCOUNTER — Other Ambulatory Visit: Payer: Self-pay

## 2019-08-06 ENCOUNTER — Ambulatory Visit (INDEPENDENT_AMBULATORY_CARE_PROVIDER_SITE_OTHER): Payer: Medicare Other | Admitting: *Deleted

## 2019-08-06 DIAGNOSIS — I639 Cerebral infarction, unspecified: Secondary | ICD-10-CM

## 2019-08-06 LAB — CUP PACEART INCLINIC DEVICE CHECK
Date Time Interrogation Session: 20201006111105
Implantable Pulse Generator Implant Date: 20180822

## 2019-08-06 NOTE — Patient Instructions (Signed)
Return for loop recorder check in office in 3 months.

## 2019-08-06 NOTE — Progress Notes (Signed)
Loop check in clinic. Battery status: OK. R-waves 0.76 mV. 0 symptom episodes, 0 tachy episodes, 0 pause episodes, 0 brady episodes. 0 AF episodes (0% burden). Monthly summary reports and ROV with in 3 months on 05/04/20.

## 2019-08-10 ENCOUNTER — Other Ambulatory Visit: Payer: Self-pay | Admitting: Physician Assistant

## 2019-08-21 ENCOUNTER — Other Ambulatory Visit: Payer: Self-pay | Admitting: Nurse Practitioner

## 2019-08-22 ENCOUNTER — Telehealth: Payer: Self-pay | Admitting: *Deleted

## 2019-08-22 NOTE — Telephone Encounter (Signed)
Received fax from [facility] re: patient is scheduled for a colonoscopy. Form needs to be completed re: holding Plavix for 5-7 days. Form on [place], NP's desk.

## 2019-08-27 ENCOUNTER — Telehealth: Payer: Self-pay | Admitting: *Deleted

## 2019-08-27 NOTE — Telephone Encounter (Signed)
Fax confirmation received [phone].  Low risk for colonoscopy.  Sent to MR.

## 2019-10-03 NOTE — Progress Notes (Signed)
CARDIOLOGY OFFICE NOTE  Date:  10/07/2019    Elizabeth Bowman Date of Birth: [birth year] Medical Record #[id number]  PCP:  Concepcion Elk, MD  Cardiologist:  Servando Snare & Skains/Taylor  Chief Complaint  Patient presents with  . Follow-up    5 month check.     History of Present Illness: Elizabeth Bowman is a 68 y.o. female who presents today for a 5 month check.  Seen for Dr. Marlou Porch & Lovena Le.   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 The patient was last seen in clinic by Dr. Marlou Porch in July 2019. Her heart failure regimen was adjusted by Entresto.  Seen by Richardson Dopp, PA back in Septemberof 2019- she was not able to afford Entresto - thus stayed on Lisinopril. Then ran out and had no refills. She was only taking Coreg once a day as well. She had cut that back previously due to BP running low.  I saw her back in October of 2019 - she was felt to be doing ok.   She was admitted back in March of 2020 at [facility] - presented with shortness of breath - found to be hypoxic and had respiratory failure - given Bipap and diuresed. Demand ischemia noted. She was sent home on Lasix.Echo was updated - EF of 28%.Her BP has typically limited guideline therapy - she was to see EP and discuss possible ICD. This did not happen.   She was feeling better when I saw her in March - unclear trigger for her CHF exacerbation but I suspected sodium indiscretion. She has followed with EP about her ILR - does not wish to do remote monitoring and will be coming in to the device clinic every 3 months. I did not see a discussion about ICD implant. I then saw her last in July - she was doing ok.  Fatigued easily. Noted more issues with her memory. I tried to get her back to EP for ICD discussion again.   The patient does not have symptoms concerning for COVID-19 infection (fever, chills, cough, or new shortness of breath).   Comes in today. Here alone. Chronic complaint of fatigue - wondering if it is her medicines. May have some degree of depression - she is not sure.  She says she is willing to talk about an ICD. Will try to get this rescheduled again for her. No chest pain. Breathing is good. No swelling. She is just fatigued. Spending lots of time watching TV. She was in the ER back in September with bronchitis - potassium little low.   Past Medical History:  Diagnosis Date  . Aortic atherosclerosis ([place]) 07/20/2018   Noted on CT in 2018  . Complication of anesthesia   . Diabetes mellitus ([place])   . Former tobacco use   . HTN (hypertension)   . Hyperlipidemia   . Stroke [facility])     Past Surgical History:  Procedure Laterality Date  . LEFT HEART CATH AND CORONARY ANGIOGRAPHY N/A 09/08/2017   Procedure: LEFT HEART CATH AND CORONARY ANGIOGRAPHY;  Surgeon: Martinique, Peter M, MD;  Location: [facility] CV LAB;  Service: Cardiovascular;  Laterality: N/A;  . LOOP RECORDER INSERTION N/A 06/21/2017   Procedure: LOOP RECORDER INSERTION;  Surgeon: Evans Lance, MD;  Location: [place]  CV LAB;  Service: Cardiovascular;  Laterality: N/A;  . TEE WITHOUT CARDIOVERSION N/A 06/21/2017   Procedure: TRANSESOPHAGEAL ECHOCARDIOGRAM (TEE);  Surgeon: Dorothy Spark, MD;  Location: [facility] ENDOSCOPY;  Service: Cardiovascular;  Laterality: N/A;     Medications: Current Meds  Medication Sig  . atorvastatin (LIPITOR) 80 MG tablet Take 1 tablet (80 mg total) by mouth daily at 6 PM.  . carvedilol (COREG) 6.25 MG tablet Take 1 tablet by mouth twice daily  . clopidogrel (PLAVIX) 75 MG tablet Take 1 tablet by mouth once daily  . furosemide (LASIX) 40 MG tablet Take 1 tablet (40 mg total) by mouth daily.   Marland Kitchen lisinopril (ZESTRIL) 20 MG tablet Take 1 tablet (20 mg total) by mouth daily.  . metFORMIN (GLUCOPHAGE) 500 MG tablet TAKE 1 TABLET BY MOUTH ONCE DAILY WITH BREAKFAST  . pantoprazole (PROTONIX) 40 MG tablet Take 40 mg by mouth daily as needed (indigestion).  . potassium chloride (K-DUR,KLOR-CON) 10 MEQ tablet Take 10 mEq by mouth 2 (two) times daily.   . Vitamin D, Ergocalciferol, (DRISDOL) 1.25 MG (50000 UT) CAPS capsule Take 50,000 Units by mouth every 7 (seven) days.   . [DISCONTINUED] doxycycline (VIBRAMYCIN) 100 MG capsule Take 1 capsule (100 mg total) by mouth 2 (two) times daily.  . [DISCONTINUED] pantoprazole (PROTONIX) 40 MG tablet Take 40 mg by mouth daily.   . [DISCONTINUED] predniSONE (STERAPRED UNI-PAK 21 TAB) 10 MG (21) TBPK tablet Take 6 tabs for 2 days, then 5 for 2 days, then 4 for 2 days, then 3 for 2 days, 2 for 2 days, then 1 for 2 days     Allergies: No Known Allergies  Social History: The patient  reports that she quit smoking about 12 years ago. She has quit using smokeless tobacco. She reports current alcohol use. She reports that she does not use drugs.   Family History: The patient's family history includes Cancer in her brother and father.   Review of Systems: Please see the history of present illness.   All other systems are reviewed and negative.   Physical Exam: VS:  BP 130/70   Pulse 69   Ht _0  (1.702 m)   Wt 154 lb 1.9 oz (69.9 kg)   SpO2 97%   BMI 24.14 kg/m  .  BMI Body mass index is 24.14 kg/m.  Wt Readings from Last 3 Encounters:  10/07/19 154 lb 1.9 oz (69.9 kg)  07/03/19 150 lb (68 kg)  05/13/19 149 lb 12.8 oz (67.9 kg)    General: Pleasant. Alert and in no acute distress.   HEENT: Normal.  Neck: Supple, no JVD, carotid bruits, or masses noted.  Cardiac: Regular rate and rhythm. No murmurs, rubs, or gallops. No edema.  Respiratory:  Lungs are clear to auscultation bilaterally with normal work of breathing.  GI: Soft and  nontender.  MS: No deformity or atrophy. Gait and ROM intact.  Skin: Warm and dry. Color is normal.  Neuro:  Strength and sensation are intact and no gross focal deficits noted.  Psych: Alert, appropriate and with normal affect.   LABORATORY DATA:  EKG:  EKG is not ordered today.   Lab Results  Component Value Date   WBC 6.3 07/03/2019   HGB 11.8 (L) 07/03/2019   HCT 36.3 07/03/2019   PLT 231 07/03/2019   GLUCOSE 112 (H) 07/03/2019   CHOL 160 03/07/2018   TRIG 84 03/07/2018   HDL 48 03/07/2018   LDLCALC 95 03/07/2018   NA 141  07/03/2019   K 3.2 (L) 07/03/2019   CL 102 07/03/2019   CREATININE 0.88 07/03/2019   BUN 11 07/03/2019   CO2 28 07/03/2019   TSH 2.701 06/20/2017   INR 1.0 09/06/2017   HGBA1C 6.1 (H) 06/18/2017     BNP (last 3 results) Recent Labs    07/03/19 1326  BNP 81.9    ProBNP (last 3 results) No results for input(s): PROBNP in the last 8760 hours.   Other Studies Reviewed Today:  EchoSummary3/2020 Mildly dilated left ventricle. Moderate to severe global LV hypokinesis. Ejection fraction 28% Normal right ventricle structure and function. Mild mitral regurgitation. Signature ------------------------------------------------------------------------------ Electronically signed by Alinda Money, MD, FACC(Interpreting physician)  Carotid US 06/20/18 Final Interpretation: Right Carotid: Velocities in the right ICA are consistent with a 1-39% stenosis. Left Carotid: Velocities in the left ICA are consistent with a 1-39% stenosis. Vertebrals: Bilateral vertebral arteries demonstrate antegrade flow.  Transcranial doppler 06/20/18 Final Interpretation: This was a normal transcranial Doppler study, with normal flow direction and velocity of all identified vessels of the anterior and posterior circulations, with no evidence of stenosis  Cardiac Catheterization11/9/18 LAD ost 20 LCx ost 10, OM1 10 EF 25-35  Nuclear stress test10/26/18   The left ventricular ejection fraction is moderately decreased (30-44%).  Nuclear stress EF: 36%.  There was no ST segment deviation noted during stress.  Defect 1: There is a small defect of mild severity present in the apex location.  Defect 2: There is a medium defect of moderate severity present in the basal inferior and mid inferior location. There is ischemia in this distribution  This is a high risk study with reduced ejection fraction and areas of infarct and ischemia identified. Consider cardiac catheterization if stable from stroke perspective.  TEE 06/21/17 Mild conc LVH, EF 30-35, diff HK, mild MR  Echo 06/19/17 Mild LVH, EF 30-35, diff HK, Gr 1 DD, MAC  Assessment/Plan:  1.Chronic systolic HF - not clear how much her fatigue is from possible depression/from heart failure or just deconditioning. I am very hesitant to cut her Coreg back given the EF of 28%. We will recheck her lab today.   2. NICM - EF of 28% - Bp and fatigue has typically limited titration of her medicines further. She says she would like to see EP and discuss ICD implant - I explained that this would most likely not change her symptoms.   3. HTN - BP is ok on current regimen.   4. Prior cryptogenic stroke - has ILR in place - she comes here every 3 months for a device chec.   5.Aortic atherosclerosis (HCC)-No obstructive CAD.She is on Plavix. CV risk factor modification encouraged.No active chest pain.   6. Paraophthalmic artery aneurysm noted on CT in 2018- she was to have follow up CTA of the head per neurology - looks to me as if she did not follow thru.   7. COVID-19 Education: The signs and symptoms of COVID-19 were discussed with the patient and how to seek care for testing (follow up with PCP or arrange E-visit).  The importance of social distancing, staying at home, hand hygiene and wearing a mask when out in public were discussed today.  Current medicines are reviewed with  the patient today.  The patient does not have concerns regarding medicines other than what has been noted above.  The following changes have been made:  See above.  Labs/ tests ordered today include:    Orders Placed This Encounter  Procedures  . Basic metabolic panel  . CBC no Diff  . Hepatic function panel  . Lipid Profile  . TSH     Disposition:   FU with Dr. Marlou Porch in a few months. Will make arrangements to see EP and discuss ICD implant.     Patient is agreeable to this plan and will call if any problems develop in the interim.   SignedTruitt Merle, NP  10/07/2019 1:50 PM  [place] [address] [place], [place]  [address] Phone: [phone] Fax: [phone]

## 2019-10-07 ENCOUNTER — Encounter: Payer: Self-pay | Admitting: Nurse Practitioner

## 2019-10-07 ENCOUNTER — Ambulatory Visit: Payer: Medicare Other | Admitting: Nurse Practitioner

## 2019-10-07 ENCOUNTER — Encounter (INDEPENDENT_AMBULATORY_CARE_PROVIDER_SITE_OTHER): Payer: Self-pay

## 2019-10-07 ENCOUNTER — Other Ambulatory Visit: Payer: Self-pay

## 2019-10-07 VITALS — BP 130/70 | HR 69 | Ht 67.0 in | Wt 154.1 lb

## 2019-10-07 DIAGNOSIS — I428 Other cardiomyopathies: Secondary | ICD-10-CM

## 2019-10-07 DIAGNOSIS — I10 Essential (primary) hypertension: Secondary | ICD-10-CM | POA: Diagnosis not present

## 2019-10-07 DIAGNOSIS — R5383 Other fatigue: Secondary | ICD-10-CM

## 2019-10-07 DIAGNOSIS — Z7189 Other specified counseling: Secondary | ICD-10-CM

## 2019-10-07 DIAGNOSIS — E876 Hypokalemia: Secondary | ICD-10-CM | POA: Diagnosis not present

## 2019-10-07 NOTE — Patient Instructions (Addendum)
After Visit Summary:  We will be checking the following labs today - BMET, CBC, HPF, Lipids and TSH    Medication Instructions:    Continue with your current medicines.    If you need a refill on your cardiac medications before your next appointment, please call your pharmacy.     Testing/Procedures To Be Arranged:  N/A  Follow-Up:   See Dr. Marlou Porch in about 4 months.   We will get you a visit with Dr. Lovena Le to talk about a "defibrillator". This will be a separate visit from checking the loop recorder.     At [facility], you and your health needs are our priority.  As part of our continuing mission to provide you with exceptional heart care, we have created designated Provider Care Teams.  These Care Teams include your primary Cardiologist (physician) and Advanced Practice Providers (APPs -  Physician Assistants and Nurse Practitioners) who all work together to provide you with the care you need, when you need it.  Special Instructions:  . Stay safe, stay home, wash your hands for at least 20 seconds and wear a mask when out in public.  . It was good to talk with you today.    Call the [place] office at [phone] if you have any questions, problems or concerns.

## 2019-10-08 LAB — CBC
Hematocrit: 37.1 % (ref 34.0–46.6)
Hemoglobin: 12.2 g/dL (ref 11.1–15.9)
MCH: 32.4 pg (ref 26.6–33.0)
MCHC: 32.9 g/dL (ref 31.5–35.7)
MCV: 99 fL — ABNORMAL HIGH (ref 79–97)
Platelets: 263 10*3/uL (ref 150–450)
RBC: 3.76 x10E6/uL — ABNORMAL LOW (ref 3.77–5.28)
RDW: 12.1 % (ref 11.7–15.4)
WBC: 5.5 10*3/uL (ref 3.4–10.8)

## 2019-10-08 LAB — HEPATIC FUNCTION PANEL
ALT: 11 IU/L (ref 0–32)
AST: 14 IU/L (ref 0–40)
Albumin: 4.3 g/dL (ref 3.8–4.8)
Alkaline Phosphatase: 90 IU/L (ref 39–117)
Bilirubin Total: 1.1 mg/dL (ref 0.0–1.2)
Bilirubin, Direct: 0.24 mg/dL (ref 0.00–0.40)
Total Protein: 6.6 g/dL (ref 6.0–8.5)

## 2019-10-08 LAB — LIPID PANEL
Chol/HDL Ratio: 3 ratio (ref 0.0–4.4)
Cholesterol, Total: 152 mg/dL (ref 100–199)
HDL: 51 mg/dL (ref >39)
LDL Chol Calc (NIH): 83 mg/dL (ref 0–99)
Triglycerides: 97 mg/dL (ref 0–149)
VLDL Cholesterol Cal: 18 mg/dL (ref 5–40)

## 2019-10-08 LAB — BASIC METABOLIC PANEL
BUN/Creatinine Ratio: 12 (ref 12–28)
BUN: 10 mg/dL (ref 8–27)
CO2: 28 mmol/L (ref 20–29)
Calcium: 9.3 mg/dL (ref 8.7–10.3)
Chloride: 102 mmol/L (ref 96–106)
Creatinine, Ser: 0.81 mg/dL (ref 0.57–1.00)
GFR calc Af Amer: 85 mL/min/{1.73_m2} (ref >59)
GFR calc non Af Amer: 74 mL/min/{1.73_m2} (ref >59)
Glucose: 120 mg/dL — ABNORMAL HIGH (ref 65–99)
Potassium: 3.6 mmol/L (ref 3.5–5.2)
Sodium: 142 mmol/L (ref 134–144)

## 2019-10-08 LAB — TSH: TSH: 1.04 u[IU]/mL (ref 0.450–4.500)

## 2019-11-12 ENCOUNTER — Encounter: Payer: Medicare Other | Admitting: Internal Medicine

## 2019-12-09 ENCOUNTER — Telehealth: Payer: Self-pay | Admitting: Nurse Practitioner

## 2019-12-09 NOTE — Telephone Encounter (Signed)
Follow Up  Patient is calling back in about needing a letter stating that she is ok to have the COVID vaccine due to her being on a blood thinner. Read vaccine dot phrase to patient and she states that she needs a physical note to take with her to her appointment or she will not be able to get vaccine. Please assist and fax note to [phone].

## 2019-12-09 NOTE — Telephone Encounter (Signed)
Patient calling stating she needs a note in order to get her COVID vaccine on 2/13, since she is on a blood thinner.

## 2019-12-09 NOTE — Telephone Encounter (Signed)
Follow up   We are recommending the COVID-19 vaccine to all of our patients. Cardiac medications (including blood thinners) should not deter anyone from being vaccinated and there is no need to hold any of those medications prior to vaccine administration.     Currently, there is a hotline to call (active 11/08/19) to schedule vaccination appointments as no walk-ins will be accepted.   Number: [phone].    If an appointment is not available please go to [email or web address] to sign up for notification when additional vaccine appointments are available.   If you have further questions or concerns about the vaccine process, please visit [email or web address] or contact your primary care physician.

## 2019-12-10 NOTE — Telephone Encounter (Signed)
Letter created.  Left VM for pt advising she did not need a letter, but would send one for her if it made her feel better.  Nurse in office will fax as requested.  No further needs.

## 2019-12-10 NOTE — Telephone Encounter (Signed)
Advised Pt letter ready for pick up at the [facility] office.  Pt indicates understanding.

## 2019-12-17 ENCOUNTER — Other Ambulatory Visit: Payer: Self-pay | Admitting: Nurse Practitioner

## 2019-12-24 ENCOUNTER — Ambulatory Visit (INDEPENDENT_AMBULATORY_CARE_PROVIDER_SITE_OTHER): Payer: Medicare Other | Admitting: *Deleted

## 2019-12-24 ENCOUNTER — Other Ambulatory Visit: Payer: Self-pay

## 2019-12-24 DIAGNOSIS — I639 Cerebral infarction, unspecified: Secondary | ICD-10-CM | POA: Diagnosis not present

## 2019-12-24 LAB — CUP PACEART INCLINIC DEVICE CHECK
Date Time Interrogation Session: 20210223104838
Implantable Pulse Generator Implant Date: 20180822

## 2019-12-24 NOTE — Progress Notes (Signed)
Loop check in clinic. Battery status: OK. R-waves 1.2 mV. 0 symptom episodes, 0 tachy episodes, 0 pause episodes, 0  brady episodes. 0 AF episodes (0% burden). Monthly summary reports and ROV with Dr Ladona Ridgel 01/02/20.

## 2020-01-02 ENCOUNTER — Ambulatory Visit (INDEPENDENT_AMBULATORY_CARE_PROVIDER_SITE_OTHER): Payer: Medicare Other | Admitting: Internal Medicine

## 2020-01-02 ENCOUNTER — Other Ambulatory Visit: Payer: Self-pay

## 2020-01-02 ENCOUNTER — Encounter: Payer: Self-pay | Admitting: Internal Medicine

## 2020-01-02 VITALS — BP 166/80 | HR 82 | Ht 67.0 in | Wt 154.8 lb

## 2020-01-02 DIAGNOSIS — I639 Cerebral infarction, unspecified: Secondary | ICD-10-CM

## 2020-01-02 DIAGNOSIS — I428 Other cardiomyopathies: Secondary | ICD-10-CM | POA: Diagnosis not present

## 2020-01-02 DIAGNOSIS — I5022 Chronic systolic (congestive) heart failure: Secondary | ICD-10-CM

## 2020-01-02 NOTE — Patient Instructions (Addendum)
Medication Instructions:  Your physician recommends that you continue on your current medications as directed. Please refer to the Current Medication list given to you today.  Labwork: None ordered.  Testing/Procedures: Your physician has recommended that you have a defibrillator inserted. An implantable cardioverter defibrillator (ICD) is a small device that is placed in your chest or, in rare cases, your abdomen. This device uses electrical pulses or shocks to help control life-threatening, irregular heartbeats that could lead the heart to suddenly stop beating (sudden cardiac arrest). Leads are attached to the ICD that goes into your heart. This is done in the hospital and usually requires an overnight stay. Please see the instruction sheet given to you today for more information.  Follow-Up:  The following dates are available for this procedure with Dr. Ladona Ridgel:  March 15, 18, 23, 24, 30 April 1, 5, 8, 12, 15, 19, 22, 26 or 30  Any Other Special Instructions Will Be Listed Below (If Applicable).  If you need a refill on your cardiac medications before your next appointment, please call your pharmacy.    Cardioverter Defibrillator Implantation  An implantable cardioverter defibrillator (ICD) is a small device that is placed under the skin in the chest or abdomen. An ICD consists of a battery, a small computer (pulse generator), and wires (leads) that go into the heart. An ICD is used to detect and correct two types of dangerous irregular heartbeats (arrhythmias):  A rapid heart rhythm (tachycardia).  An arrhythmia in which the lower chambers of the heart (ventricles) contract in an uncoordinated way (fibrillation). When an ICD detects tachycardia, it sends a low-energy shock to the heart to restore the heartbeat to normal (cardioversion). This signal is usually painless. If cardioversion does not work or if the ICD detects fibrillation, it delivers a high-energy shock to the heart  (defibrillation) to restart the heart. This shock may feel like a strong jolt in the chest. Your health care provider may prescribe an ICD if:  You have had an arrhythmia that originated in the ventricles.  Your heart has been damaged by a disease or heart condition. Sometimes, ICDs are programmed to act as a device called a pacemaker. Pacemakers can be used to treat a slow heartbeat (bradycardia) or tachycardia by taking over the heart rate with electrical impulses. Tell a health care provider about:  Any allergies you have.  All medicines you are taking, including vitamins, herbs, eye drops, creams, and over-the-counter medicines.  Any problems you or family members have had with anesthetic medicines.  Any blood disorders you have.  Any surgeries you have had.  Any medical conditions you have.  Whether you are pregnant or may be pregnant. What are the risks? Generally, this is a safe procedure. However, problems may occur, including:  Swelling, bleeding, or bruising.  Infection.  Blood clots.  Damage to other structures or organs, such as nerves, blood vessels, or the heart.  Allergic reactions to medicines used during the procedure. What happens before the procedure? Staying hydrated Follow instructions from your health care provider about hydration, which may include:  Up to 2 hours before the procedure - you may continue to drink clear liquids, such as water, clear fruit juice, black coffee, and plain tea. Eating and drinking restrictions Follow instructions from your health care provider about eating and drinking, which may include:  8 hours before the procedure - stop eating heavy meals or foods such as meat, fried foods, or fatty foods.  6 hours before the procedure -  stop eating light meals or foods, such as toast or cereal.  6 hours before the procedure - stop drinking milk or drinks that contain milk.  2 hours before the procedure - stop drinking clear  liquids. Medicine Ask your health care provider about:  Changing or stopping your normal medicines. This is important if you take diabetes medicines or blood thinners.  Taking medicines such as aspirin and ibuprofen. These medicines can thin your blood. Do not take these medicines before your procedure if your doctor tells you not to. Tests  You may have blood tests.  You may have a test to check the electrical signals in your heart (electrocardiogram, ECG).  You may have imaging tests, such as a chest X-ray. General instructions  For 24 hours before the procedure, stop using products that contain nicotine or tobacco, such as cigarettes and e-cigarettes. If you need help quitting, ask your health care provider.  Plan to have someone take you home from the hospital or clinic.  You may be asked to shower with a germ-killing soap. What happens during the procedure?  To reduce your risk of infection: ? Your health care team will wash or sanitize their hands. ? Your skin will be washed with soap. ? Hair may be removed from the surgical area.  Small monitors will be put on your body. They will be used to check your heart, blood pressure, and oxygen level.  An IV tube will be inserted into one of your veins.  You will be given one or more of the following: ? A medicine to help you relax (sedative). ? A medicine to numb the area (local anesthetic). ? A medicine to make you fall asleep (general anesthetic).  Leads will be guided through a blood vessel into your heart and attached to your heart muscles. Depending on the ICD, the leads may go into one ventricle or they may go into both ventricles and into an upper chamber of the heart. An X-ray machine (fluoroscope) will be usedto help guide the leads.  A small incision will be made to create a deep pocket under your skin.  The pulse generator will be placed into the pocket.  The ICD will be tested.  The incision will be closed with  stitches (sutures), skin glue, or staples.  A bandage (dressing) will be placed over the incision. This procedure may vary among health care providers and hospitals. What happens after the procedure?  Your blood pressure, heart rate, breathing rate, and blood oxygen level will be monitored often until the medicines you were given have worn off.  A chest X-ray will be taken to check that the ICD is in the right place.  You will need to stay in the hospital for 1-2 days so your health care provider can make sure your ICD is working.  Do not drive for 24 hours if you received a sedative. Ask your health care provider when it is safe for you to drive.  You may be given an identification card explaining that you have an ICD. Summary  An implantable cardioverter defibrillator (ICD) is a small device that is placed under the skin in the chest or abdomen. It is used to detect and correct dangerous irregular heartbeats (arrhythmias).  An ICD consists of a battery, a small computer (pulse generator), and wires (leads) that go into the heart.  When an ICD detects rapid heart rhythm (tachycardia), it sends a low-energy shock to the heart to restore the heartbeat to normal (  cardioversion). If cardioversion does not work or if the ICD detects uncoordinated heart contractions (fibrillation), it delivers a high-energy shock to the heart (defibrillation) to restart the heart.  You will need to stay in the hospital for 1-2 days to make sure your ICD is working. This information is not intended to replace advice given to you by your health care provider. Make sure you discuss any questions you have with your health care provider. Document Revised: 09/29/2017 Document Reviewed: 10/26/2016 Elsevier Patient Education  2020 [employer].

## 2020-01-02 NOTE — Progress Notes (Signed)
HPI Elizabeth Bowman returns today for additional followup. She is a pleasant 69 yo woman with a h/o HTN, dyslipidemia and chronic systolic heart failure. She has not had syncope. She rarely has peripheral edema. She has no anginal symptoms and no obstructive CAD on left heart cath. She has been on maximal medical therapy and her EF is 25-30% by echo.  No Known Allergies   Current Outpatient Medications  Medication Sig Dispense Refill  . atorvastatin (LIPITOR) 80 MG tablet Take 1 tablet (80 mg total) by mouth daily at 6 PM. 30 tablet 0  . carvedilol (COREG) 6.25 MG tablet Take 1 tablet by mouth twice daily 180 tablet 2  . clopidogrel (PLAVIX) 75 MG tablet Take 1 tablet by mouth once daily 90 tablet 3  . furosemide (LASIX) 40 MG tablet Take 1 tablet (40 mg total) by mouth daily. 90 tablet 3  . lisinopril (ZESTRIL) 20 MG tablet Take 1 tablet (20 mg total) by mouth daily. 90 tablet 3  . metFORMIN (GLUCOPHAGE) 500 MG tablet TAKE 1 TABLET BY MOUTH ONCE DAILY WITH BREAKFAST    . pantoprazole (PROTONIX) 40 MG tablet Take 40 mg by mouth daily as needed (indigestion).    . potassium chloride (K-DUR,KLOR-CON) 10 MEQ tablet Take 10 mEq by mouth 2 (two) times daily.     . Vitamin D, Ergocalciferol, (DRISDOL) 1.25 MG (50000 UT) CAPS capsule Take 50,000 Units by mouth every 7 (seven) days.      No current facility-administered medications for this visit.     Past Medical History:  Diagnosis Date  . Aortic atherosclerosis ([place]) 07/20/2018   Noted on CT in 2018  . Complication of anesthesia   . Diabetes mellitus ([place])   . Former tobacco use   . HTN (hypertension)   . Hyperlipidemia   . Stroke ([place])     ROS:   All systems reviewed and negative except as noted in the HPI.   Past Surgical History:  Procedure Laterality Date  . LEFT HEART CATH AND CORONARY ANGIOGRAPHY N/A 09/08/2017   Procedure: LEFT HEART CATH AND CORONARY ANGIOGRAPHY;  Surgeon: Martinique, Peter M, MD;  Location: [facility];  Service: Cardiovascular;  Laterality: N/A;  . LOOP RECORDER INSERTION N/A 06/21/2017   Procedure: LOOP RECORDER INSERTION;  Surgeon: Elizabeth Lance, MD;  Location: [facility];  Service: Cardiovascular;  Laterality: N/A;  . TEE WITHOUT CARDIOVERSION N/A 06/21/2017   Procedure: TRANSESOPHAGEAL ECHOCARDIOGRAM (TEE);  Surgeon: Elizabeth Spark, MD;  Location: [facility] ENDOSCOPY;  Service: Cardiovascular;  Laterality: N/A;     Family History  Problem Relation Age of Onset  . Cancer Father   . Cancer Brother   . CAD Neg Hx   . Heart attack Neg Hx   . Congestive Heart Failure Neg Hx      Social History   Socioeconomic History  . Marital status: Married    Spouse name: Not on file  . Number of children: Not on file  . Years of education: Not on file  . Highest education level: Not on file  Occupational History  . Not on file  Tobacco Use  . Smoking status: Former Smoker    Quit date: 06/19/2007    Years since quitting: 12.5  . Smokeless tobacco: Former Systems developer  . Tobacco comment: Smoked for 20-30 years  Substance and Sexual Activity  . Alcohol use: Yes    Comment: occasional drink a few times a month, not regularly  .  Drug use: No  . Sexual activity: Yes    Partners: Female  Other Topics Concern  . Not on file  Social History Narrative  . Not on file   Social Determinants of Health   Financial Resource Strain:   . Difficulty of Paying Living Expenses: Not on file  Food Insecurity:   . Worried About Programme researcher, broadcasting/film/video in the Last Year: Not on file  . Ran Out of Food in the Last Year: Not on file  Transportation Needs:   . Lack of Transportation (Medical): Not on file  . Lack of Transportation (Non-Medical): Not on file  Physical Activity:   . Days of Exercise per Week: Not on file  . Minutes of Exercise per Session: Not on file  Stress:   . Feeling of Stress : Not on file  Social Connections:   . Frequency of Communication with Friends and Family: Not on file   . Frequency of Social Gatherings with Friends and Family: Not on file  . Attends Religious Services: Not on file  . Active Member of Clubs or Organizations: Not on file  . Attends Banker Meetings: Not on file  . Marital Status: Not on file  Intimate Partner Violence:   . Fear of Current or Ex-Partner: Not on file  . Emotionally Abused: Not on file  . Physically Abused: Not on file  . Sexually Abused: Not on file     BP (!) 166/80   Pulse 82   Ht 5\' 7"  (1.702 Bowman)   Wt 154 lb 12.8 oz (70.2 kg)   SpO2 98%   BMI 24.25 kg/Bowman   Physical Exam:  Well appearing NAD HEENT: Unremarkable Neck:  No JVD, no thyromegally Lymphatics:  No adenopathy Back:  No CVA tenderness Lungs:  Clear with no wheezes HEART:  Regular rate rhythm, no murmurs, no rubs, no clicks Abd:  soft, positive bowel sounds, no organomegally, no rebound, no guarding Ext:  2 plus pulses, no edema, no cyanosis, no clubbing Skin:  No rashes no nodules Neuro:  CN II through XII intact, motor grossly intact  EKG - reviewed, NSR   Assess/Plan: 1. Chronic systolic heart failure - she has class 2 symptoms. I have discussed the risks/benefits/goals/expectations of ICD insertion for primary prevention of malignant ventricular arrhythmias. She will call if she would like to proceed. 2. HTN - her bp is up today but is normally better controlled.  3. Cryptogenic stroke - we have no documented atrial fib. She refused to have her ILR checked over the phone.  4. Dyslipidemia - she will continue her lipitor.  [place].D.

## 2020-01-24 ENCOUNTER — Encounter: Payer: Self-pay | Admitting: Cardiology

## 2020-01-24 ENCOUNTER — Ambulatory Visit: Payer: Medicare Other | Admitting: Cardiology

## 2020-01-24 ENCOUNTER — Other Ambulatory Visit: Payer: Self-pay

## 2020-01-24 ENCOUNTER — Ambulatory Visit (HOSPITAL_COMMUNITY): Payer: Medicare Other | Attending: Cardiology

## 2020-01-24 VITALS — BP 140/70 | HR 70 | Ht 67.0 in | Wt 156.0 lb

## 2020-01-24 DIAGNOSIS — I639 Cerebral infarction, unspecified: Secondary | ICD-10-CM

## 2020-01-24 DIAGNOSIS — I5022 Chronic systolic (congestive) heart failure: Secondary | ICD-10-CM

## 2020-01-24 LAB — ECHOCARDIOGRAM COMPLETE
Height: 67 in
Weight: 2496 oz

## 2020-01-24 NOTE — Progress Notes (Signed)
Cardiology Office Note:    Date:  01/24/2020   ID:  Elizabeth Bowman, DOB 11-11-1950, MRN [id number]  PCP:  Concepcion Elk, MD  Cardiologist:  Candee Furbish, MD  Electrophysiologist:  None   Referring MD: Concepcion Elk, MD     History of Present Illness:    Elizabeth Bowman is a 69 y.o. female here for the follow-up of chronic systolic heart failure EF 25 to 30% with nonobstructive CAD on left heart catheterization, nonischemic cardiomyopathy.  Overall feels well from a heart perspective.  She has been recently diagnosed with left-sided breast cancer.  No fevers chills nausea vomiting syncope bleeding.  See below.  Past Medical History:  Diagnosis Date  . Aortic atherosclerosis ([place]) 07/20/2018   Noted on CT in 2018  . Complication of anesthesia   . Diabetes mellitus ([place])   . Former tobacco use   . HTN (hypertension)   . Hyperlipidemia   . Stroke [facility])     Past Surgical History:  Procedure Laterality Date  . LEFT HEART CATH AND CORONARY ANGIOGRAPHY N/A 09/08/2017   Procedure: LEFT HEART CATH AND CORONARY ANGIOGRAPHY;  Surgeon: Martinique, Peter M, MD;  Location: [facility];  Service: Cardiovascular;  Laterality: N/A;  . LOOP RECORDER INSERTION N/A 06/21/2017   Procedure: LOOP RECORDER INSERTION;  Surgeon: Evans Lance, MD;  Location: [facility];  Service: Cardiovascular;  Laterality: N/A;  . TEE WITHOUT CARDIOVERSION N/A 06/21/2017   Procedure: TRANSESOPHAGEAL ECHOCARDIOGRAM (TEE);  Surgeon: Dorothy Spark, MD;  Location: [facility] ENDOSCOPY;  Service: Cardiovascular;  Laterality: N/A;    Current Medications: Current Meds  Medication Sig  . atorvastatin (LIPITOR) 80 MG tablet Take 1 tablet (80 mg total) by mouth daily at 6 PM.  . carvedilol (COREG) 6.25 MG tablet Take 1 tablet by mouth twice daily  . clopidogrel (PLAVIX) 75 MG tablet Take 1 tablet by mouth once daily  . furosemide (LASIX) 40 MG tablet Take 1 tablet (40 mg total) by mouth daily.  Marland Kitchen lisinopril (ZESTRIL) 20  MG tablet Take 1 tablet (20 mg total) by mouth daily.  . metFORMIN (GLUCOPHAGE) 500 MG tablet TAKE 1 TABLET BY MOUTH ONCE DAILY WITH BREAKFAST  . pantoprazole (PROTONIX) 40 MG tablet Take 40 mg by mouth daily as needed (indigestion).  . potassium chloride (K-DUR,KLOR-CON) 10 MEQ tablet Take 10 mEq by mouth 2 (two) times daily.   . Vitamin D, Ergocalciferol, (DRISDOL) 1.25 MG (50000 UT) CAPS capsule Take 50,000 Units by mouth every 7 (seven) days.      Allergies:   Patient has no known allergies.   Social History   Socioeconomic History  . Marital status: Married    Spouse name: Not on file  . Number of children: Not on file  . Years of education: Not on file  . Highest education level: Not on file  Occupational History  . Not on file  Tobacco Use  . Smoking status: Former Smoker    Quit date: 06/19/2007    Years since quitting: 12.6  . Smokeless tobacco: Former Systems developer  . Tobacco comment: Smoked for 20-30 years  Substance and Sexual Activity  . Alcohol use: Yes    Comment: occasional drink a few times a month, not regularly  . Drug use: No  . Sexual activity: Yes    Partners: Female  Other Topics Concern  . Not on file  Social History Narrative  . Not on file   Social Determinants of Health   Financial  Resource Strain:   . Difficulty of Paying Living Expenses:   Food Insecurity:   . Worried About Programme researcher, broadcasting/film/video in the Last Year:   . Barista in the Last Year:   Transportation Needs:   . Freight forwarder (Medical):   Marland Kitchen Lack of Transportation (Non-Medical):   Physical Activity:   . Days of Exercise per Week:   . Minutes of Exercise per Session:   Stress:   . Feeling of Stress :   Social Connections:   . Frequency of Communication with Friends and Family:   . Frequency of Social Gatherings with Friends and Family:   . Attends Religious Services:   . Active Member of Clubs or Organizations:   . Attends Banker Meetings:   Marland Kitchen Marital  Status:      Family History: The patient's family history includes Cancer in her brother and father. There is no history of CAD, Heart attack, or Congestive Heart Failure.  ROS:   Please see the history of present illness.     All other systems reviewed and are negative.  EKGs/Labs/Other Studies Reviewed:    The following studies were reviewed today:  Cath 2018: 1. Minor nonobstructive CAD 2. Severe LV dysfunction 3. Normal LVEDP  TEE 2018:  - Left ventricle: The cavity size was mildly dilated. There was  mild concentric hypertrophy. Systolic function was moderately to  severely reduced. The estimated ejection fraction was in the  range of 30% to 35%. Diffuse hypokinesis.  - Aortic valve: No evidence of vegetation.  - Mitral valve: There was mild regurgitation.  - Left atrium: No evidence of thrombus in the atrial cavity or  appendage. No evidence of thrombus in the atrial cavity or  appendage. No evidence of thrombus in the appendage.  - Right atrium: The atrium was dilated. No evidence of thrombus in  the atrial cavity or appendage.  - Atrial septum: No defect or patent foramen ovale was identified.  Echo contrast study showed no right-to-left atrial level shunt,  following an increase in RA pressure induced by provocative  maneuvers.  - Pulmonic valve: No evidence of vegetation.   Impressions:   - No cardiac source of emboli was indentified.   EKG:  EKG is not ordered today.    Recent Labs: 07/03/2019: B Natriuretic Peptide 81.9 10/07/2019: ALT 11; BUN 10; Creatinine, Ser 0.81; Hemoglobin 12.2; Platelets 263; Potassium 3.6; Sodium 142; TSH 1.040  Recent Lipid Panel    Component Value Date/Time   CHOL 152 10/07/2019 1358   TRIG 97 10/07/2019 1358   HDL 51 10/07/2019 1358   CHOLHDL 3.0 10/07/2019 1358   CHOLHDL 3.8 06/19/2017 0310   VLDL 16 06/19/2017 0310   LDLCALC 83 10/07/2019 1358    Physical Exam:    VS:  BP 140/70   Pulse 70   Ht  5\' 7"  (1.702 m)   Wt 156 lb (70.8 kg)   SpO2 96%   BMI 24.43 kg/m     Wt Readings from Last 3 Encounters:  01/24/20 156 lb (70.8 kg)  01/02/20 154 lb 12.8 oz (70.2 kg)  10/07/19 154 lb 1.9 oz (69.9 kg)     GEN:  Well nourished, well developed in no acute distress HEENT: Normal NECK: No JVD; No carotid bruits LYMPHATICS: No lymphadenopathy CARDIAC: RRR, no murmurs, rubs, gallops RESPIRATORY:  Clear to auscultation without rales, wheezing or rhonchi  ABDOMEN: Soft, non-tender, non-distended MUSCULOSKELETAL:  No edema; No deformity  SKIN: Warm  and dry NEUROLOGIC:  Alert and oriented x 3 PSYCHIATRIC:  Normal affect   ASSESSMENT:    1. Chronic systolic CHF (congestive heart failure) (HCC)   2. Cryptogenic stroke (HCC)    PLAN:    In order of problems listed above:  Chronic systolic heart failure -NYHA class II.  ICD insertion was discussed with Dr. Ladona Ridgel.  She was going to call if she wanted to proceed.  Understands potential for malignant ventricular arrhythmias. -Continue with overall good medical therapy for nonischemic cardiomyopathy.  We talked about this again today.  I think our first steps would be to repeat her echocardiogram.  Her EF may have improved. -Continue with beta-blocker ACE inhibitor.  Essential hypertension -Continue to monitor.  Optimize.  Hyperlipidemia -Continue with statin LDL 95 2019  Cryptogenic stroke -Did not want implantable loop recorder checked previously over the phone.  No documented A. fib.  Prior check 12/24/2019-no A. 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  Left breast cancer -If she does need surgery, she is able to proceed with moderate overall cardiac risk based upon her prior chronic systolic heart failure.  We are checking echocardiogram to update her ejection fraction.  She is not having any anginal symptoms.  Also, if for some reason her implantable loop recorder  which is a very small device, needs to be removed to optimize breast cancer therapy, we can always help her with this.  It has been implanted since 2018 and she has had no evidence of atrial fibrillation.  Her battery status is good.  Medication Adjustments/Labs and Tests Ordered: Current medicines are reviewed at length with the patient today.  Concerns regarding medicines are outlined above.  Orders Placed This Encounter  Procedures  . ECHOCARDIOGRAM COMPLETE   No orders of the defined types were placed in this encounter.   Patient Instructions  Medication Instructions:  The current medical regimen is effective;  continue present plan and medications.  *If you need a refill on your cardiac medications before your next appointment, please call your pharmacy*  Testing/Procedures: Your physician has requested that you have an echocardiogram. Echocardiography is a painless test that uses sound waves to create images of your heart. It provides your doctor with information about the size and shape of your heart and how well your heart's chambers and valves are working. This procedure takes approximately one hour. There are no restrictions for this procedure.  Follow-Up: At [facility], you and your health needs are our priority.  As part of our continuing mission to provide you with exceptional heart care, we have created designated Provider Care Teams.  These Care Teams include your primary Cardiologist (physician) and Advanced Practice Providers (APPs -  Physician Assistants and Nurse Practitioners) who all work together to provide you with the care you need, when you need it.  We recommend signing up for the patient portal called "MyChart".  Sign up information is provided on this After Visit Summary.  MyChart is used to connect with patients for Virtual Visits (Telemedicine).  Patients are able to view lab/test results, encounter notes, upcoming appointments, etc.  Non-urgent messages can  be sent to your provider as well.   To learn more about what you can do with MyChart, go to [email or web address].    Your next appointment:   6 month(s)  The format for your next appointment:   In Person  Provider:   Georgie Chard, NP and 1 yr with  Dr Anne Fu.  Thank you for choosing [facility]!!        Signed, Donato Schultz, MD  01/24/2020 11:09 AM    [facility]

## 2020-01-24 NOTE — Patient Instructions (Signed)
Medication Instructions:  The current medical regimen is effective;  continue present plan and medications.  *If you need a refill on your cardiac medications before your next appointment, please call your pharmacy*  Testing/Procedures: Your physician has requested that you have an echocardiogram. Echocardiography is a painless test that uses sound waves to create images of your heart. It provides your doctor with information about the size and shape of your heart and how well your heart's chambers and valves are working. This procedure takes approximately one hour. There are no restrictions for this procedure.  Follow-Up: At [facility] you and your health needs are our priority.  As part of our continuing mission to provide you with exceptional heart care, we have created designated Provider Care Teams.  These Care Teams include your primary Cardiologist (physician) and Advanced Practice Providers (APPs -  Physician Assistants and Nurse Practitioners) who all work together to provide you with the care you need, when you need it.  We recommend signing up for the patient portal called "MyChart".  Sign up information is provided on this After Visit Summary.  MyChart is used to connect with patients for Virtual Visits (Telemedicine).  Patients are able to view lab/test results, encounter notes, upcoming appointments, etc.  Non-urgent messages can be sent to your provider as well.   To learn more about what you can do with MyChart, go to [email or web address].    Your next appointment:   6 month(s)  The format for your next appointment:   In Person  Provider:   Georgie Chard, NP and 1 yr with Dr Anne Fu.  Thank you for choosing [facility]!!

## 2020-03-21 ENCOUNTER — Other Ambulatory Visit: Payer: Self-pay | Admitting: Nurse Practitioner

## 2020-06-22 ENCOUNTER — Other Ambulatory Visit: Payer: Self-pay | Admitting: Nurse Practitioner

## 2020-08-02 NOTE — Progress Notes (Signed)
CARDIOLOGY OFFICE NOTE  Date:  08/12/2020    Elizabeth Bowman Date of Birth: 1951/06/21 Medical Record #[id number]  PCP:  Concepcion Elk, MD  Cardiologist:  Marisa Cyphers   Chief Complaint  Patient presents with  . Follow-up    History of Present Illness: Elizabeth Bowman is a 69 y.o. female who presents today for a 6 month check. Seen for Dr. Marlou Porch. She sees Dr. Lovena Le as well.   She has a history of chronic systolic HF with EF 25 to 30% with non obstructive CAD at time of cath. Other issues include breast cancer, prior cryptogenic stroke with ILR in place, DM, parophthalmic artery aneurysm HTN and HLD. She was not able to afford Entresto.   She was admittedback in [place] [facility] - presented with shortness of breath - found to be hypoxic and had respiratory failure - given Bipap and diuresed. Demand ischemia noted. She was sent home on Lasix.Echo was updated - EF of 28%.Her BP has typically limited guideline therapy - she was to see EP and discuss possible ICD.  Last seen by Dr. Marlou Porch in March - she was felt to be doing ok. Saw Dr. Lovena Le back in March as well. Echo was updated - EF now 40% - no ICD planned.   Comes in today. Here with Mr. Pree today. She is doing well. She has lost some weight. She notes she is eating less. Feels ok. Still with some chronic fatigue. Seems unchanged. No chest pain. Not short of breath. BP is good at home and here today. She feels like she is ok. Tolerating her medicines. Seeing her PCP later this week with labs.   Past Medical History:  Diagnosis Date  . Aortic atherosclerosis ([place]) 07/20/2018   Noted on CT in 2018  . Complication of anesthesia   . Diabetes mellitus ([place])   . Former tobacco use   . HTN (hypertension)   . Hyperlipidemia   . Stroke [facility])     Past Surgical History:  Procedure Laterality Date  . LEFT HEART CATH AND CORONARY ANGIOGRAPHY N/A 09/08/2017   Procedure: LEFT HEART CATH AND CORONARY  ANGIOGRAPHY;  Surgeon: Martinique, Peter M, MD;  Location: [place] CV LAB;  Service: Cardiovascular;  Laterality: N/A;  . LOOP RECORDER INSERTION N/A 06/21/2017   Procedure: LOOP RECORDER INSERTION;  Surgeon: Evans Lance, MD;  Location: [place] CV LAB;  Service: Cardiovascular;  Laterality: N/A;  . TEE WITHOUT CARDIOVERSION N/A 06/21/2017   Procedure: TRANSESOPHAGEAL ECHOCARDIOGRAM (TEE);  Surgeon: Dorothy Spark, MD;  Location: [facility] ENDOSCOPY;  Service: Cardiovascular;  Laterality: N/A;     Medications: Current Meds  Medication Sig  . anastrozole (ARIMIDEX) 1 MG tablet Take 1 mg by mouth daily.  Marland Kitchen atorvastatin (LIPITOR) 80 MG tablet Take 1 tablet (80 mg total) by mouth daily at 6 PM.  . carvedilol (COREG) 6.25 MG tablet Take 1 tablet by mouth twice daily  . clopidogrel (PLAVIX) 75 MG tablet Take 1 tablet by mouth once daily  . furosemide (LASIX) 40 MG tablet Take 1 tablet by mouth once daily  . lisinopril (ZESTRIL) 20 MG tablet TAKE 1 TABLET BY MOUTH ONCE DAILY STOP ENTRESTO  . metFORMIN (GLUCOPHAGE) 500 MG tablet TAKE 1 TABLET BY MOUTH ONCE DAILY WITH BREAKFAST  . pantoprazole (PROTONIX) 40 MG tablet Take 40 mg by mouth daily as needed (indigestion).  . potassium chloride (K-DUR,KLOR-CON) 10 MEQ tablet Take 10 mEq by mouth 2 (two) times daily.   Marland Kitchen  Vitamin D, Ergocalciferol, (DRISDOL) 1.25 MG (50000 UT) CAPS capsule Take 50,000 Units by mouth every 7 (seven) days.      Allergies: No Known Allergies  Social History: The patient  reports that she quit smoking about 13 years ago. She has quit using smokeless tobacco. She reports current alcohol use. She reports that she does not use drugs.   Family History: The patient's family history includes Cancer in her brother and father.   Review of Systems: Please see the history of present illness.   All other systems are reviewed and negative.   Physical Exam: VS:  BP 136/82   Pulse 63   Ht _0  (1.702 m)   Wt 149 lb 12.8 oz  (67.9 kg)   SpO2 100%   BMI 23.46 kg/m  .  BMI Body mass index is 23.46 kg/m.  Wt Readings from Last 3 Encounters:  08/12/20 149 lb 12.8 oz (67.9 kg)  01/24/20 156 lb (70.8 kg)  01/02/20 154 lb 12.8 oz (70.2 kg)    General: Alert and in no acute distress.  Her weight is down.  Cardiac: Regular rate and rhythm. No murmurs, rubs, or gallops. No edema.  Respiratory:  Lungs are clear to auscultation bilaterally with normal work of breathing.  GI: Soft and nontender.  MS: No deformity or atrophy. Gait and ROM intact.  Skin: Warm and dry. Color is normal.  Neuro:  Strength and sensation are intact and no gross focal deficits noted.  Psych: Alert, appropriate and with normal affect.   LABORATORY DATA:  EKG:  EKG is ordered today.  Personally reviewed by me. This demonstrates Sinus Bradycardia - HR is 58.  Lab Results  Component Value Date   WBC 5.5 10/07/2019   HGB 12.2 10/07/2019   HCT 37.1 10/07/2019   PLT 263 10/07/2019   GLUCOSE 120 (H) 10/07/2019   CHOL 152 10/07/2019   TRIG 97 10/07/2019   HDL 51 10/07/2019   LDLCALC 83 10/07/2019   ALT 11 10/07/2019   AST 14 10/07/2019   NA 142 10/07/2019   K 3.6 10/07/2019   CL 102 10/07/2019   CREATININE 0.81 10/07/2019   BUN 10 10/07/2019   CO2 28 10/07/2019   TSH 1.040 10/07/2019   INR 1.0 09/06/2017   HGBA1C 6.1 (H) 06/18/2017     BNP (last 3 results) No results for input(s): BNP in the last 8760 hours.  ProBNP (last 3 results) No results for input(s): PROBNP in the last 8760 hours.   Other Studies Reviewed Today:  ECHO IMPRESSIONS 12/2019  1. Left ventricular ejection fraction, by estimation, is 40%. The left  ventricle has mildly decreased function. The left ventricle demonstrates  global hypokinesis. The left ventricular internal cavity size was severely  dilated. Left ventricular  diastolic parameters are consistent with Grade I diastolic dysfunction  (impaired relaxation). Elevated left ventricular  end-diastolic pressure.  The average left ventricular global longitudinal strain is -15.7 %.  2. Right ventricular systolic function is normal. The right ventricular  size is normal.  3. The mitral valve is normal in structure. Trivial mitral valve  regurgitation. No evidence of mitral stenosis.  4. The aortic valve is tricuspid. Aortic valve regurgitation is not  visualized. Mild to moderate aortic valve sclerosis/calcification is  present, without any evidence of aortic stenosis.  5. The inferior vena cava is normal in size with greater than 50%  respiratory variability, suggesting right atrial pressure of 3 mmHg.    Carotid US 06/20/18 Final Interpretation:  Right Carotid: Velocities in the right ICA are consistent with a 1-39% stenosis. Left Carotid: Velocities in the left ICA are consistent with a 1-39% stenosis. Vertebrals: Bilateral vertebral arteries demonstrate antegrade flow.  Transcranial doppler 06/20/18 Final Interpretation: This was a normal transcranial Doppler study, with normal flow direction and velocity of all identified vessels of the anterior and posterior circulations, with no evidence of stenosis  Cardiac Catheterization11/9/18 LAD ost 20 LCx ost 10, OM1 10 EF 25-35  Nuclear stress test10/26/18  The left ventricular ejection fraction is moderately decreased (30-44%).  Nuclear stress EF: 36%.  There was no ST segment deviation noted during stress.  Defect 1: There is a small defect of mild severity present in the apex location.  Defect 2: There is a medium defect of moderate severity present in the basal inferior and mid inferior location. There is ischemia in this distribution  This is a high risk study with reduced ejection fraction and areas of infarct and ischemia identified. Consider cardiac catheterization if stable from stroke perspective.  TEE 06/21/17 Mild conc LVH, EF 30-35, diff HK, mild MR  Echo 06/19/17 Mild LVH, EF 30-35, diff  HK, Gr 1 DD, MAC    ASSESSMENT & PLAN:    1. Chronic systolic HF - EF 02% - she is doing well - looks euvolemic on exam. She is on beta blocker and ACE. No changes made today.   2. Prior cryptogenic stroke - she is on Plavix. Has ILR in place.   3. HTN - BP looks great - no changes made and would continue with current regimen.   4. HLD - on statin - she has upcoming labs with PCP per her report.   5. Left breast cancer - she is on Arimidex.   6. Paraophthalmic artery aneurysm noted on CT scan in 2018 - she did not have her scan apparently in 2020.   7. Non obstructive CAD - needs CV risk factor modification. She has no worrisome symptoms.   Current medicines are reviewed with the patient today.  The patient does not have concerns regarding medicines other than what has been noted above.  The following changes have been made:  See above.  Labs/ tests ordered today include:    Orders Placed This Encounter  Procedures  . EKG 12-Lead     Disposition:   FU with Dr. Marlou Porch in 6 months.    Patient is agreeable to this plan and will call if any problems develop in the interim.   SignedTruitt Merle, NP  08/12/2020 3:08 PM  [place] [address] [place], [place]  [address] Phone: [phone] Fax: [phone]

## 2020-08-12 ENCOUNTER — Ambulatory Visit (INDEPENDENT_AMBULATORY_CARE_PROVIDER_SITE_OTHER): Payer: Medicare Other | Admitting: Nurse Practitioner

## 2020-08-12 ENCOUNTER — Encounter: Payer: Self-pay | Admitting: Nurse Practitioner

## 2020-08-12 ENCOUNTER — Other Ambulatory Visit: Payer: Self-pay

## 2020-08-12 VITALS — BP 136/82 | HR 63 | Ht 67.0 in | Wt 149.8 lb

## 2020-08-12 DIAGNOSIS — I5022 Chronic systolic (congestive) heart failure: Secondary | ICD-10-CM | POA: Diagnosis not present

## 2020-08-12 DIAGNOSIS — I639 Cerebral infarction, unspecified: Secondary | ICD-10-CM

## 2020-08-12 DIAGNOSIS — I10 Essential (primary) hypertension: Secondary | ICD-10-CM | POA: Diagnosis not present

## 2020-08-12 DIAGNOSIS — I428 Other cardiomyopathies: Secondary | ICD-10-CM

## 2020-08-12 NOTE — Patient Instructions (Addendum)
After Visit Summary:  We will be checking the following labs today - NONE   Medication Instructions:    Continue with your current medicines.    If you need a refill on your cardiac medications before your next appointment, please call your pharmacy.     Testing/Procedures To Be Arranged:  N/A  Follow-Up:   See Dr. Skains in 6 months -  You will receive a reminder letter in the mail two months in advance. If you don't receive a letter, please call our office to schedule the follow-up appointment.    At [facility], you and your health needs are our priority.  As part of our continuing mission to provide you with exceptional heart care, we have created designated Provider Care Teams.  These Care Teams include your primary Cardiologist (physician) and Advanced Practice Providers (APPs -  Physician Assistants and Nurse Practitioners) who all work together to provide you with the care you need, when you need it.  Special Instructions:  . Stay safe, wash your hands for at least 20 seconds and wear a mask when needed.  . It was good to talk with you today.    Call the [facility] office at [phone] if you have any questions, problems or concerns.       

## 2020-10-12 ENCOUNTER — Other Ambulatory Visit: Payer: Self-pay | Admitting: Nurse Practitioner

## 2020-10-23 ENCOUNTER — Emergency Department (HOSPITAL_BASED_OUTPATIENT_CLINIC_OR_DEPARTMENT_OTHER)
Admission: EM | Admit: 2020-10-23 | Discharge: 2020-10-23 | Disposition: A | Discharge status: Home / Self Care | Payer: Medicare Other | Attending: Emergency Medicine | Admitting: Emergency Medicine

## 2020-10-23 ENCOUNTER — Emergency Department (HOSPITAL_BASED_OUTPATIENT_CLINIC_OR_DEPARTMENT_OTHER): Payer: Medicare Other

## 2020-10-23 ENCOUNTER — Encounter (HOSPITAL_BASED_OUTPATIENT_CLINIC_OR_DEPARTMENT_OTHER): Payer: Self-pay

## 2020-10-23 ENCOUNTER — Other Ambulatory Visit: Payer: Self-pay

## 2020-10-23 DIAGNOSIS — I110 Hypertensive heart disease with heart failure: Secondary | ICD-10-CM | POA: Diagnosis not present

## 2020-10-23 DIAGNOSIS — Z20822 Contact with and (suspected) exposure to covid-19: Secondary | ICD-10-CM | POA: Diagnosis not present

## 2020-10-23 DIAGNOSIS — Z79899 Other long term (current) drug therapy: Secondary | ICD-10-CM | POA: Insufficient documentation

## 2020-10-23 DIAGNOSIS — Z87891 Personal history of nicotine dependence: Secondary | ICD-10-CM | POA: Diagnosis not present

## 2020-10-23 DIAGNOSIS — Z955 Presence of coronary angioplasty implant and graft: Secondary | ICD-10-CM | POA: Diagnosis not present

## 2020-10-23 DIAGNOSIS — Z7984 Long term (current) use of oral hypoglycemic drugs: Secondary | ICD-10-CM | POA: Insufficient documentation

## 2020-10-23 DIAGNOSIS — J069 Acute upper respiratory infection, unspecified: Secondary | ICD-10-CM | POA: Insufficient documentation

## 2020-10-23 DIAGNOSIS — I5022 Chronic systolic (congestive) heart failure: Secondary | ICD-10-CM | POA: Insufficient documentation

## 2020-10-23 DIAGNOSIS — E119 Type 2 diabetes mellitus without complications: Secondary | ICD-10-CM | POA: Insufficient documentation

## 2020-10-23 DIAGNOSIS — R059 Cough, unspecified: Secondary | ICD-10-CM | POA: Diagnosis present

## 2020-10-23 LAB — RESP PANEL BY RT-PCR (FLU A&B, COVID) ARPGX2
Influenza A by PCR: NEGATIVE
Influenza B by PCR: NEGATIVE
SARS Coronavirus 2 by RT PCR: NEGATIVE

## 2020-10-23 MED ORDER — BENZONATATE 100 MG PO CAPS: 100.0000 mg | ORAL_CAPSULE | Freq: Three times a day (TID) | ORAL | 0 refills | Status: AC | PRN

## 2020-10-23 NOTE — Discharge Instructions (Addendum)
Your COVID and flu swab is negative today. Your chest xray shows no evidence of a bacterial infection or pneumonia. You likely have a viral respiratory infection that does not require antibiotics at this time, as antibiotics only treat bacterial infections. You can take the tessalon (benzonatate) every 8 hours as needed for cough. Please follow closely with your primary care provider if symptoms persist. Return to the ER you develop fever, persisting shortness of breath, if your mucus becomes colored, or for other worsening symptoms.

## 2020-10-23 NOTE — ED Provider Notes (Signed)
[facility] EMERGENCY DEPARTMENT Provider Note   CSN: [id number] Arrival date & time: 10/23/20  1130     History Chief Complaint  Patient presents with  . Cough    Elizabeth Bowman is a 69 y.o. female past medical history of type 2 diabetes, CHF, CVA, presenting to the emergency department with complaint of cough that began on Wednesday of last week.  She states she has intermittent coughing fits that are sometimes productive of a clear mucus she also feels some nasal congestion and fatigue.  She feels some shortness of breath when she is actively coughing though otherwise no shortness of breath.  Her sides feels sore from the frequent coughing.  No sore throat, fever, abdominal complaints, lower extremity edema.  No known Covid exposures.  She is vaccinated against Covid.  She is treated her symptoms with over-the-counter cold medications with moderate relief.   The history is provided by the patient.       Past Medical History:  Diagnosis Date  . Aortic atherosclerosis ([facility]) 07/20/2018   Noted on CT in 2018  . Complication of anesthesia   . Diabetes mellitus ([facility])   . Former tobacco use   . HTN (hypertension)   . Hyperlipidemia   . Stroke [facility])     Patient Active Problem List   Diagnosis Date Noted  . Cryptogenic stroke (HCC) 04/23/2019  . Chronic systolic CHF (congestive heart failure) (HCC) 07/20/2018  . Aortic atherosclerosis ([facility]) 07/20/2018  . Essential hypertension 07/20/2018  . Nonischemic cardiomyopathy (HCC) 09/08/2017  . Abnormal nuclear stress test 09/08/2017  . Aphasia   . History of stroke 06/18/2017  . Diabetes mellitus without complication (HCC) 06/18/2017  . Diabetes mellitus type 2 in nonobese (HCC) 06/18/2017  . HLD (hyperlipidemia) 06/18/2017    Past Surgical History:  Procedure Laterality Date  . LEFT HEART CATH AND CORONARY ANGIOGRAPHY N/A 09/08/2017   Procedure: LEFT HEART CATH AND CORONARY ANGIOGRAPHY;  Surgeon: Swazil[place], Peter M, MD;   Location: [facility] INVASIVE CV LAB;  Service: Cardiovascular;  Laterality: N/A;  . LOOP RECORDER INSERTION N/A 06/21/2017   Procedure: LOOP RECORDER INSERTION;  Surgeon: Marinus Maw, MD;  Location: [facility] INVASIVE CV LAB;  Service: Cardiovascular;  Laterality: N/A;  . TEE WITHOUT CARDIOVERSION N/A 06/21/2017   Procedure: TRANSESOPHAGEAL ECHOCARDIOGRAM (TEE);  Surgeon: Lars Masson, MD;  Location: [facility] ENDOSCOPY;  Service: Cardiovascular;  Laterality: N/A;     OB History   No obstetric history on file.     Family History  Problem Relation Age of Onset  . Cancer Father   . Cancer Brother   . CAD Neg Hx   . Heart attack Neg Hx   . Congestive Heart Failure Neg Hx     Social History   Tobacco Use  . Smoking status: Former Smoker    Quit date: 06/19/2007    Years since quitting: 13.3  . Smokeless tobacco: Former Neurosurgeon  . Tobacco comment: Smoked for 20-30 years  Vaping Use  . Vaping Use: Never used  Substance Use Topics  . Alcohol use: Yes    Comment: occasional drink a few times a month, not regularly  . Drug use: No    Home Medications Prior to Admission medications   Medication Sig Start Date End Date Taking? Authorizing Provider  anastrozole (ARIMIDEX) 1 MG tablet Take 1 mg by mouth daily. 06/22/20   [provider]  atorvastatin (LIPITOR) 80 MG tablet Take 1 tablet (80 mg total) by mouth daily at  6 PM. 06/21/17   Edsel Petrin, DO  benzonatate (TESSALON) 100 MG capsule Take 1 capsule (100 mg total) by mouth 3 (three) times daily as needed for cough. 10/23/20   Jaedon Siler, [place] N, PA-C  carvedilol (COREG) 6.25 MG tablet Take 1 tablet by mouth twice daily 08/12/19   Tereso Newcomer T, PA-C  clopidogrel (PLAVIX) 75 MG tablet Take 1 tablet by mouth once daily 12/17/19   Rosalio Macadamia, NP  furosemide (LASIX) 40 MG tablet Take 1 tablet by mouth once daily 10/13/20   Jake Bathe, MD  lisinopril (ZESTRIL) 20 MG tablet TAKE 1 TABLET BY MOUTH ONCE DAILY STOP ENTRESTO 06/22/20    Rosalio Macadamia, NP  metFORMIN (GLUCOPHAGE) 500 MG tablet TAKE 1 TABLET BY MOUTH ONCE DAILY WITH BREAKFAST 03/19/18   [provider]  pantoprazole (PROTONIX) 40 MG tablet Take 40 mg by mouth daily as needed (indigestion).    [provider]  potassium chloride (K-DUR,KLOR-CON) 10 MEQ tablet Take 10 mEq by mouth 2 (two) times daily.  01/14/19   [provider]  Vitamin D, Ergocalciferol, (DRISDOL) 1.25 MG (50000 UT) CAPS capsule Take 50,000 Units by mouth every 7 (seven) days.  10/03/18   [provider]    Allergies    Patient has no known allergies.  Review of Systems   Review of Systems  Constitutional: Positive for fatigue. Negative for fever.  Respiratory: Positive for cough.   All other systems reviewed and are negative.   Physical Exam Updated Vital Signs BP (!) 150/73   Pulse 70   Temp 98.3 F (36.8 C) (Oral)   Resp 16   Ht 5\' 7"  (1.702 m)   Wt 68 kg   SpO2 98%   BMI 23.49 kg/m   Physical Exam Vitals and nursing note reviewed.  Constitutional:      General: She is not in acute distress.    Appearance: She is well-developed and well-nourished. She is not ill-appearing.  HENT:     Head: Normocephalic and atraumatic.     Mouth/Throat:     Mouth: Mucous membranes are moist.     Pharynx: Oropharynx is clear.  Eyes:     Conjunctiva/sclera: Conjunctivae normal.  Cardiovascular:     Rate and Rhythm: Normal rate and regular rhythm.  Pulmonary:     Effort: Pulmonary effort is normal. No respiratory distress.     Breath sounds: Normal breath sounds.     Comments: Speaking in full sentences without difficulty. Nl work of breathing Abdominal:     General: Bowel sounds are normal.     Palpations: Abdomen is soft.     Tenderness: There is no abdominal tenderness.  Musculoskeletal:     Right lower leg: No edema.     Left lower leg: No edema.  Skin:    General: Skin is warm.  Neurological:     Mental Status: She is alert.   Psychiatric:        Mood and Affect: Mood and affect normal.        Behavior: Behavior normal.     ED Results / Procedures / Treatments   Labs (all labs ordered are listed, but only abnormal results are displayed) Labs Reviewed  RESP PANEL BY RT-PCR (FLU A&B, COVID) ARPGX2    EKG None  Radiology DG Chest 2 View  Result Date: 10/23/2020 CLINICAL DATA:  Productive EXAM: CHEST - 2 VIEW COMPARISON:  07/03/2019 FINDINGS: Stable cardiomediastinal contours. Loop recorder device present. Atherosclerotic calcification of the  aortic knob. No focal airspace consolidation, pleural effusion, or pneumothorax. IMPRESSION: No active cardiopulmonary disease. Electronically Signed   By: Duanne Guess D.O.   On: 10/23/2020 14:50    Procedures Procedures (including critical care time)  Medications Ordered in ED Medications - No data to display  ED Course  I have reviewed the triage vital signs and the nursing notes.  Pertinent labs & imaging results that were available during my care of the patient were reviewed by me and considered in my medical decision making (see chart for details).    Elizabeth Bowman was evaluated in Emergency Department on 10/23/2020 for the symptoms described in the history of present illness. She was evaluated in the context of the global COVID-19 pandemic, which necessitated consideration that the patient might be at risk for infection with the SARS-CoV-2 virus that causes COVID-19. Institutional protocols and algorithms that pertain to the evaluation of patients at risk for COVID-19 are in a state of rapid change based on information released by regulatory bodies including the CDC and federal and state organizations. These policies and algorithms were followed during the patient's care in the ED.  MDM Rules/Calculators/A&P                          Patient presenting to the ED with complaint of cough that began a little over 1 week ago. Cough is intermittently  productive of clear sputum. She is not SOB, no fevers.  On evaluation she is well-appearing in no distress.  Vital signs are stable, afebrile.  She is speaking in full sentences without difficulty, O2 sat is 98% on room air.  Lungs are clear bilaterally.  Oropharynx is clear.  Covid swab and flu swab are negative here in the ED.  Chest x-ray is negative for infiltrate. No indication of bacterial infection at this time, including absence of fever, clear mucus, negative chest x-ray.  Likely viral URI.  Discussed that antibiotics are not indicated for viral illnesses.   Patient is instructed to follow closely with PCP if symptoms persist.  If symptoms worsen in any way, she develops fever, shortness of breath, colored mucus, or other concerning symptoms she is encouraged to return to the ED for reevaluation.  She is prescribed Tessalon for cough, continue over-the-counter medications as needed.  Oral hydration encouraged.  Patient is well-appearing and in no distress prior to discharge.   Final Clinical Impression(s) / ED Diagnoses Final diagnoses:  Viral URI with cough    Rx / DC Orders ED Discharge Orders         Ordered    benzonatate (TESSALON) 100 MG capsule  3 times daily PRN        10/23/20 1511           Taige Housman, [place] N, PA-C 10/23/20 1520    Jacalyn Lefevre, MD 10/23/20 1521

## 2020-10-23 NOTE — ED Triage Notes (Signed)
Pt arrives with a productive and sometimes dry cough X1 week with fatigue. Pt is fully vaccinated.

## 2020-11-02 ENCOUNTER — Other Ambulatory Visit: Payer: Self-pay | Admitting: Physician Assistant

## 2021-02-09 ENCOUNTER — Emergency Department (HOSPITAL_BASED_OUTPATIENT_CLINIC_OR_DEPARTMENT_OTHER): Payer: Medicare Other

## 2021-02-09 ENCOUNTER — Emergency Department (HOSPITAL_COMMUNITY): Payer: Medicare Other

## 2021-02-09 ENCOUNTER — Emergency Department (HOSPITAL_BASED_OUTPATIENT_CLINIC_OR_DEPARTMENT_OTHER)
Admission: EM | Admit: 2021-02-09 | Discharge: 2021-02-09 | Disposition: A | Discharge status: Home / Self Care | Payer: Medicare Other | Attending: Emergency Medicine | Admitting: Emergency Medicine

## 2021-02-09 ENCOUNTER — Encounter (HOSPITAL_BASED_OUTPATIENT_CLINIC_OR_DEPARTMENT_OTHER): Payer: Self-pay

## 2021-02-09 ENCOUNTER — Other Ambulatory Visit: Payer: Self-pay

## 2021-02-09 DIAGNOSIS — Z20822 Contact with and (suspected) exposure to covid-19: Secondary | ICD-10-CM | POA: Diagnosis not present

## 2021-02-09 DIAGNOSIS — Z79899 Other long term (current) drug therapy: Secondary | ICD-10-CM | POA: Diagnosis not present

## 2021-02-09 DIAGNOSIS — R42 Dizziness and giddiness: Secondary | ICD-10-CM | POA: Insufficient documentation

## 2021-02-09 DIAGNOSIS — E1169 Type 2 diabetes mellitus with other specified complication: Secondary | ICD-10-CM | POA: Insufficient documentation

## 2021-02-09 DIAGNOSIS — I5022 Chronic systolic (congestive) heart failure: Secondary | ICD-10-CM | POA: Diagnosis not present

## 2021-02-09 DIAGNOSIS — Z8673 Personal history of transient ischemic attack (TIA), and cerebral infarction without residual deficits: Secondary | ICD-10-CM | POA: Diagnosis not present

## 2021-02-09 DIAGNOSIS — E785 Hyperlipidemia, unspecified: Secondary | ICD-10-CM | POA: Diagnosis not present

## 2021-02-09 DIAGNOSIS — Z7902 Long term (current) use of antithrombotics/antiplatelets: Secondary | ICD-10-CM | POA: Insufficient documentation

## 2021-02-09 DIAGNOSIS — Z87891 Personal history of nicotine dependence: Secondary | ICD-10-CM | POA: Diagnosis not present

## 2021-02-09 DIAGNOSIS — I110 Hypertensive heart disease with heart failure: Secondary | ICD-10-CM | POA: Diagnosis not present

## 2021-02-09 LAB — CBC WITH DIFFERENTIAL/PLATELET
Abs Immature Granulocytes: 0.01 10*3/uL (ref 0.00–0.07)
Basophils Absolute: 0 10*3/uL (ref 0.0–0.1)
Basophils Relative: 1 %
Eosinophils Absolute: 0.1 10*3/uL (ref 0.0–0.5)
Eosinophils Relative: 1 %
HCT: 36.5 % (ref 36.0–46.0)
Hemoglobin: 12.2 g/dL (ref 12.0–15.0)
Immature Granulocytes: 0 %
Lymphocytes Relative: 38 %
Lymphs Abs: 1.8 10*3/uL (ref 0.7–4.0)
MCH: 33.5 pg (ref 26.0–34.0)
MCHC: 33.4 g/dL (ref 30.0–36.0)
MCV: 100.3 fL — ABNORMAL HIGH (ref 80.0–100.0)
Monocytes Absolute: 0.4 10*3/uL (ref 0.1–1.0)
Monocytes Relative: 9 %
Neutro Abs: 2.4 10*3/uL (ref 1.7–7.7)
Neutrophils Relative %: 51 %
Platelets: 239 10*3/uL (ref 150–400)
RBC: 3.64 MIL/uL — ABNORMAL LOW (ref 3.87–5.11)
RDW: 12.1 % (ref 11.5–15.5)
WBC: 4.7 10*3/uL (ref 4.0–10.5)
nRBC: 0 % (ref 0.0–0.2)

## 2021-02-09 LAB — COMPREHENSIVE METABOLIC PANEL
ALT: 14 U/L (ref 0–44)
AST: 18 U/L (ref 15–41)
Albumin: 4.1 g/dL (ref 3.5–5.0)
Alkaline Phosphatase: 73 U/L (ref 38–126)
Anion gap: 8 (ref 5–15)
BUN: 17 mg/dL (ref 8–23)
CO2: 30 mmol/L (ref 22–32)
Calcium: 9.6 mg/dL (ref 8.9–10.3)
Chloride: 103 mmol/L (ref 98–111)
Creatinine, Ser: 0.78 mg/dL (ref 0.44–1.00)
GFR, Estimated: >60 mL/min (ref >60)
Glucose, Bld: 105 mg/dL — ABNORMAL HIGH (ref 70–99)
Potassium: 3.5 mmol/L (ref 3.5–5.1)
Sodium: 141 mmol/L (ref 135–145)
Total Bilirubin: 0.8 mg/dL (ref 0.3–1.2)
Total Protein: 7.3 g/dL (ref 6.5–8.1)

## 2021-02-09 LAB — URINALYSIS, ROUTINE W REFLEX MICROSCOPIC
Bilirubin Urine: NEGATIVE
Glucose, UA: NEGATIVE mg/dL
Hgb urine dipstick: NEGATIVE
Ketones, ur: NEGATIVE mg/dL
Leukocytes,Ua: NEGATIVE
Nitrite: NEGATIVE
Protein, ur: NEGATIVE mg/dL
Specific Gravity, Urine: 1.02 (ref 1.005–1.030)
pH: 7 (ref 5.0–8.0)

## 2021-02-09 LAB — RESP PANEL BY RT-PCR (FLU A&B, COVID) ARPGX2
Influenza A by PCR: NEGATIVE
Influenza B by PCR: NEGATIVE
SARS Coronavirus 2 by RT PCR: NEGATIVE

## 2021-02-09 MED ORDER — ONDANSETRON 4 MG PO TBDP
4.0000 mg | ORAL_TABLET | Freq: Once | ORAL | Status: AC
  Administered 2021-02-09: 4 mg via ORAL
  Filled 2021-02-09: qty 1

## 2021-02-09 MED ORDER — SODIUM CHLORIDE 0.9 % IV BOLUS
500.0000 mL | Freq: Once | INTRAVENOUS | Status: AC
  Administered 2021-02-09: 500 mL via INTRAVENOUS

## 2021-02-09 MED ORDER — MECLIZINE HCL 12.5 MG PO TABS: 12.5000 mg | ORAL_TABLET | Freq: Three times a day (TID) | ORAL | 0 refills | Status: DC | PRN

## 2021-02-09 MED ORDER — MECLIZINE HCL 25 MG PO TABS
25.0000 mg | ORAL_TABLET | Freq: Once | ORAL | Status: AC
  Administered 2021-02-09: 25 mg via ORAL
  Filled 2021-02-09: qty 1

## 2021-02-09 MED ORDER — SODIUM CHLORIDE 0.9 % IV BOLUS
500.0000 mL | Freq: Once | INTRAVENOUS | Status: AC
Start: 2021-02-09 — End: 2021-02-09
  Administered 2021-02-09: 500 mL via INTRAVENOUS

## 2021-02-09 NOTE — ED Notes (Signed)
[facility] at bedside 

## 2021-02-09 NOTE — ED Notes (Signed)
States feels better.  When up to rest room stumbled when getting up inially then did well walking the rest of the way to rest room

## 2021-02-09 NOTE — ED Provider Notes (Signed)
  Physical Exam  BP (!) 177/60 (BP Location: Right Arm)   Pulse 86   Temp 97.8 F (36.6 C) (Oral)   Resp 16   Ht 5\' 7"  (1.702 m)   Wt 65.8 kg   SpO2 100%   BMI 22.71 kg/m   Physical Exam  ED Course/Procedures     Procedures  MDM  Patient was transferred from [facility] for dizziness.  Patient has acute onset of dizziness this morning.  Patient transferred to get MRI to rule out stroke  6:41 PM MRI did not show a stroke.  Patient is able to ambulate and has no obvious nystagmus on my exam.  Patient was given a dose of meclizine already.  Will discharge home with meclizine as needed and follow-up with neurology. Likely BPPV    [facility], MD 02/09/21 [phone]

## 2021-02-09 NOTE — ED Notes (Signed)
Patient states that she still doesn't feel good and still feels dizzy. Provider made aware at this time. Provider also made awre of patients blood pressure for discharge.

## 2021-02-09 NOTE — ED Notes (Signed)
Handoff report given to [facility] 

## 2021-02-09 NOTE — ED Notes (Signed)
Patient continues to have dizziness  With getting up to restroom.  MD reevaluate and discharge cancelled.  Plan to transfer to another ER for MRI of head.

## 2021-02-09 NOTE — ED Notes (Signed)
Handoff report given to [facility] ER Ryan RN  Patient accepted by Dr Jacqulyn Bath

## 2021-02-09 NOTE — ED Provider Notes (Addendum)
[facility] EMERGENCY DEPARTMENT Provider Note   CSN: [id number] Arrival date & time: 02/09/21  1009     History Chief Complaint  Patient presents with  . Dizziness    Elizabeth Bowman is a 70 y.o. female.  Patient is a 70 year old female who presents with dizziness.  She has a history of diabetes, hypertension, hyperlipidemia and prior stroke.  She states she woke up this morning at about 5 AM with dizziness.  Her last known normal was last night around 1:30 AM when she went to bed.  She states that she has a sensation of spinning.  It started when she was standing up or going from sitting to standing.  But it also happened when she went to lay down.  She said at one point she felt like a cloud came over her and her eyes were dark but this only lasted a few seconds and she has not had any further vision changes.  She said she did not really feel like she was going to pass out but more she had the dizziness.  She did not feel off balance.  There is no numbness or weakness to her extremities.  She says at times she felt like she was dropping some words or having a hard time remembering words but in general she is not noticing other speech deficits.  She has no chest pain, palpitations or shortness of breath.  No history of similar symptoms in the past.  No fevers, coughing colds or other recent illnesses.  She has had a prior stroke that presented with some slurred speech.        Past Medical History:  Diagnosis Date  . Aortic atherosclerosis (HCC) 07/20/2018   Noted on CT in 2018  . Complication of anesthesia   . Diabetes mellitus (HCC)   . Former tobacco use   . HTN (hypertension)   . Hyperlipidemia   . Stroke [facility])     Patient Active Problem List   Diagnosis Date Noted  . Cryptogenic stroke (HCC) 04/23/2019  . Chronic systolic CHF (congestive heart failure) (HCC) 07/20/2018  . Aortic atherosclerosis (HCC) 07/20/2018  . Essential hypertension 07/20/2018  . Nonischemic  cardiomyopathy (HCC) 09/08/2017  . Abnormal nuclear stress test 09/08/2017  . Aphasia   . History of stroke 06/18/2017  . Diabetes mellitus without complication (HCC) 06/18/2017  . Diabetes mellitus type 2 in nonobese (HCC) 06/18/2017  . HLD (hyperlipidemia) 06/18/2017    Past Surgical History:  Procedure Laterality Date  . LEFT HEART CATH AND CORONARY ANGIOGRAPHY N/A 09/08/2017   Procedure: LEFT HEART CATH AND CORONARY ANGIOGRAPHY;  Surgeon: Swaziland, Peter M, MD;  Location: [facility] INVASIVE CV LAB;  Service: Cardiovascular;  Laterality: N/A;  . LOOP RECORDER INSERTION N/A 06/21/2017   Procedure: LOOP RECORDER INSERTION;  Surgeon: Marinus Maw, MD;  Location: [facility] INVASIVE CV LAB;  Service: Cardiovascular;  Laterality: N/A;  . TEE WITHOUT CARDIOVERSION N/A 06/21/2017   Procedure: TRANSESOPHAGEAL ECHOCARDIOGRAM (TEE);  Surgeon: Lars Masson, MD;  Location: [facility] ENDOSCOPY;  Service: Cardiovascular;  Laterality: N/A;     OB History   No obstetric history on file.     Family History  Problem Relation Age of Onset  . Cancer Father   . Cancer Brother   . CAD Neg Hx   . Heart attack Neg Hx   . Congestive Heart Failure Neg Hx     Social History   Tobacco Use  . Smoking status: Former Smoker  Quit date: 06/19/2007    Years since quitting: 13.6  . Smokeless tobacco: Former Neurosurgeon  . Tobacco comment: Smoked for 20-30 years  Vaping Use  . Vaping Use: Never used  Substance Use Topics  . Alcohol use: Yes    Comment: occasional drink a few times a month, not regularly  . Drug use: No    Home Medications Prior to Admission medications   Medication Sig Start Date End Date Taking? Authorizing Provider  anastrozole (ARIMIDEX) 1 MG tablet Take 1 mg by mouth daily. 06/22/20   [provider]  atorvastatin (LIPITOR) 80 MG tablet Take 1 tablet (80 mg total) by mouth daily at 6 PM. 06/21/17   Edsel Petrin, DO  benzonatate (TESSALON) 100 MG capsule Take 1 capsule (100 mg total) by  mouth 3 (three) times daily as needed for cough. 10/23/20   Robinson, [place] N, PA-C  carvedilol (COREG) 6.25 MG tablet Take 1 tablet (6.25 mg total) by mouth 2 (two) times daily. FOLLOW UP DUE IN [facility] 2022. PLEASE CALL AND SCHEDULE 11/02/20   Jake Bathe, MD  clopidogrel (PLAVIX) 75 MG tablet Take 1 tablet by mouth once daily 12/17/19   Rosalio Macadamia, NP  furosemide (LASIX) 40 MG tablet Take 1 tablet by mouth once daily 10/13/20   Jake Bathe, MD  lisinopril (ZESTRIL) 20 MG tablet TAKE 1 TABLET BY MOUTH ONCE DAILY STOP ENTRESTO 06/22/20   Rosalio Macadamia, NP  metFORMIN (GLUCOPHAGE) 500 MG tablet TAKE 1 TABLET BY MOUTH ONCE DAILY WITH BREAKFAST 03/19/18   [provider]  pantoprazole (PROTONIX) 40 MG tablet Take 40 mg by mouth daily as needed (indigestion).    [provider]  potassium chloride (K-DUR,KLOR-CON) 10 MEQ tablet Take 10 mEq by mouth 2 (two) times daily.  01/14/19   [provider]  Vitamin D, Ergocalciferol, (DRISDOL) 1.25 MG (50000 UT) CAPS capsule Take 50,000 Units by mouth every 7 (seven) days.  10/03/18   [provider]    Allergies    Patient has no known allergies.  Review of Systems   Review of Systems  Constitutional: Negative for chills, diaphoresis, fatigue and fever.  HENT: Negative for congestion, rhinorrhea and sneezing.   Eyes: Negative.   Respiratory: Negative for cough, chest tightness and shortness of breath.   Cardiovascular: Negative for chest pain and leg swelling.  Gastrointestinal: Negative for abdominal pain, blood in stool, diarrhea, nausea and vomiting.  Genitourinary: Negative for difficulty urinating, flank pain, frequency and hematuria.  Musculoskeletal: Negative for arthralgias and back pain.  Skin: Negative for rash.  Neurological: Positive for dizziness and speech difficulty. Negative for weakness, numbness and headaches.    Physical Exam Updated Vital Signs BP (!) 179/80 (BP Location: Right Arm)    Pulse 64   Temp 97.8 F (36.6 C) (Oral)   Resp 19   Ht 5\' 7"  (1.702 m)   Wt 65.8 kg   SpO2 98%   BMI 22.71 kg/m   Physical Exam Constitutional:      Appearance: She is well-developed.  HENT:     Head: Normocephalic and atraumatic.  Eyes:     Pupils: Pupils are equal, round, and reactive to light.  Cardiovascular:     Rate and Rhythm: Normal rate and regular rhythm.     Heart sounds: Normal heart sounds.  Pulmonary:     Effort: Pulmonary effort is normal. No respiratory distress.     Breath sounds: Normal breath sounds. No wheezing or rales.  Chest:  Chest wall: No tenderness.  Abdominal:     General: Bowel sounds are normal.     Palpations: Abdomen is soft.     Tenderness: There is no abdominal tenderness. There is no guarding or rebound.  Musculoskeletal:        General: Normal range of motion.     Cervical back: Normal range of motion and neck supple.  Lymphadenopathy:     Cervical: No cervical adenopathy.  Skin:    General: Skin is warm and dry.     Findings: No rash.  Neurological:     Mental Status: She is alert and oriented to person, place, and time.     Comments: Motor 5/5 all extremities Sensation grossly intact to LT all extremities Finger to Nose intact, no pronator drift CN II-XII grossly intact Visual fields full to confrontation      ED Results / Procedures / Treatments   Labs (all labs ordered are listed, but only abnormal results are displayed) Labs Reviewed  COMPREHENSIVE METABOLIC PANEL - Abnormal; Notable for the following components:      Result Value   Glucose, Bld 105 (*)    All other components within normal limits  CBC WITH DIFFERENTIAL/PLATELET - Abnormal; Notable for the following components:   RBC 3.64 (*)    MCV 100.3 (*)    All other components within normal limits  URINALYSIS, ROUTINE W REFLEX MICROSCOPIC  CBG MONITORING, ED    EKG EKG Interpretation  Date/Time:  Tuesday February 09 2021 10:25:29 EDT Ventricular Rate:   61 PR Interval:  176 QRS Duration: 100 QT Interval:  414 QTC Calculation: 416 R Axis:   48 Text Interpretation:  Poor data quality, interpretation may be adversely affected Normal sinus rhythm Moderate voltage criteria for LVH, may be normal variant ( Sokolow-Lyon , Cornell product ) Borderline ECG Confirmed by Rolan Bucco [id number]) on 02/09/2021 10:29:11 AM   Radiology CT Head Wo Contrast  Result Date: 02/09/2021 CLINICAL DATA:  Dizziness EXAM: CT HEAD WITHOUT CONTRAST TECHNIQUE: Contiguous axial images were obtained from the base of the skull through the vertex without intravenous contrast. COMPARISON:  11/23/2018 FINDINGS: Brain: No evidence of acute infarction, hemorrhage, hydrocephalus, extra-axial collection or mass lesion/mass effect. Prior infarct in the left insula is again seen and stable. Vascular: No hyperdense vessel or unexpected calcification. Skull: Normal. Negative for fracture or focal lesion. Sinuses/Orbits: No acute finding. Other: None. IMPRESSION: No acute intracranial abnormality noted. Electronically Signed   By: Alcide Clever M.D.   On: 02/09/2021 11:34    Procedures Procedures   Medications Ordered in ED Medications  meclizine (ANTIVERT) tablet 25 mg (25 mg Oral Given 02/09/21 1056)  sodium chloride 0.9 % bolus 500 mL (0 mLs Intravenous Stopped 02/09/21 1238)  sodium chloride 0.9 % bolus 500 mL (0 mLs Intravenous Stopped 02/09/21 1417)    ED Course  I have reviewed the triage vital signs and the nursing notes.  Pertinent labs & imaging results that were available during my care of the patient were reviewed by me and considered in my medical decision making (see chart for details).    MDM Rules/Calculators/A&P                          Patient is a 70 year old female who presents with dizziness.  She has a little bit of a description of spinning although she says its not worse when she moves her head around.  Its mostly if she stands up.  She does not have any  nystagmus.  She does not have any neurologic deficits.  There was a question if she had some difficulty with some word finding earlier this morning but she has not had any since she has been in the emergency department.  And it did not seem like a true aphasia from what she was describing.  She had a head CT which shows no acute abnormality.  Her labs are nonconcerning.  Her EKG is not concerning.  She was given a bit of IV fluids and ambulated.  She had a little dizziness when she first stood up but then otherwise she did okay.  She was given another 500 cc bolus and her dizziness has completely resolved.  She is able to walk up and down the halls without any dizziness or ataxia.  Given this, I would have a low suspicion that this was a stroke or TIA.  She was discharged home in good conditions.  She was encouraged to follow-up with her PCP within the next few days.  Return precautions were given.  14:50 as patient was being discharged, her dizziness returned.  She is describing it as a spinning type sensation.  We will go ahead and transferred to [facility] to get a brain MRI.  If negative for acute stroke, she can likely be discharged with follow-up with her PCP.  Dr. Jacqulyn Bath was notified and accepted the patient for transfer to [facility], ED. Final Clinical Impression(s) / ED Diagnoses Final diagnoses:  Dizziness    Rx / DC Orders ED Discharge Orders    None       Rolan Bucco, MD 02/09/21 1436    Rolan Bucco, MD 02/09/21 1451

## 2021-02-09 NOTE — ED Notes (Signed)
Patient will remain NPO until further  orders.  Patient aware

## 2021-02-09 NOTE — ED Notes (Signed)
Patient ambulated in hallway  [facility] without assist without difficulty; steady gait.  States has had no dizziness.

## 2021-02-09 NOTE — Discharge Instructions (Addendum)
  Take meclizine as needed for dizziness.  Your MRI today did not show a stroke.  Follow-up with neurology  Return to ER if you have worse dizziness, passing out, weakness, trouble speaking

## 2021-02-09 NOTE — ED Triage Notes (Signed)
Emergency Medicine Provider Triage Evaluation Note  Elizabeth Bowman , a 70 y.o. female  was evaluated in triage.  Pt complains of dizziness .  Review of Systems  Positive: dizziness Negative: Neuro deficits, headache  Physical Exam  BP (!) 170/85 (BP Location: Right Arm)   Pulse (!) 57   Temp 97.8 F (36.6 C) (Oral)   Resp 18   Ht 5\' 7"  (1.702 m)   Wt 65.8 kg   SpO2 100%   BMI 22.71 kg/m  Gen:   Awake, no distress    HEENT:  Atraumatic   Resp:  Normal effort   Cardiac:  Normal rate   Abd:   Nondistended, nontender   MSK:   Moves extremities without difficulty   Neuro:  Speech clear    Medical Decision Making  Medically screening exam initiated at 10:35 AM.  Appropriate orders placed.  Elizabeth Bowman was informed that the remainder of the evaluation will be completed by another provider, this initial triage assessment does not replace that evaluation, and the importance of remaining in the ED until their evaluation is complete.  Clinical Impression  dizziness   Jerilynn Mages, MD 02/09/21 1036

## 2021-02-09 NOTE — ED Notes (Signed)
PT ambulated with steady gait and no assistance. Complained of dizziness sitting and standing.

## 2021-02-09 NOTE — ED Notes (Signed)
Report to Michael, RN

## 2021-02-09 NOTE — ED Triage Notes (Signed)
Pt arrives stating she became dizzy upon standing when she would change from sitting to standing this morning, states the room would go dark. Denies syncope. BP elevated today. Denies CP/SOB

## 2021-04-29 ENCOUNTER — Institutional Professional Consult (permissible substitution): Payer: Medicare Other | Admitting: Neurology

## 2021-05-12 ENCOUNTER — Other Ambulatory Visit: Payer: Self-pay | Admitting: Cardiology

## 2021-08-22 IMAGING — CT CT HEAD W/O CM
2 series · 15 of 30 positions shown, 17 images · non-contrast
Comparison: 11/23/2018

CLINICAL DATA: Dizziness

EXAM:
CT HEAD WITHOUT CONTRAST
TECHNIQUE: Contiguous axial images were obtained from the base of the skull
through the vertex without intravenous contrast.

[Series 2: head wo · axial · 0.40mm/px · z∈[-168,-48]mm · 7 of 32 slices shown, 9 images]
[im 4/32  brain]
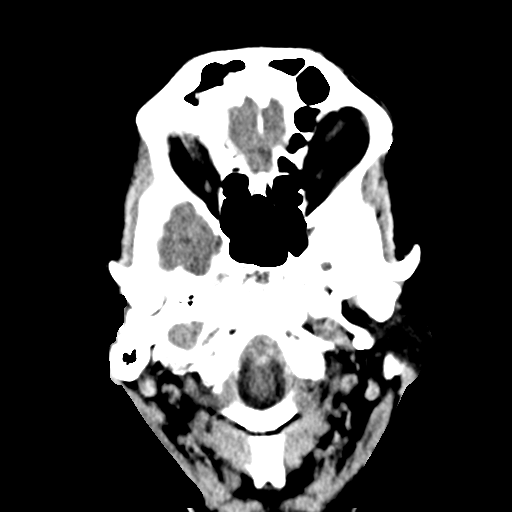
[im 4/32  bone]
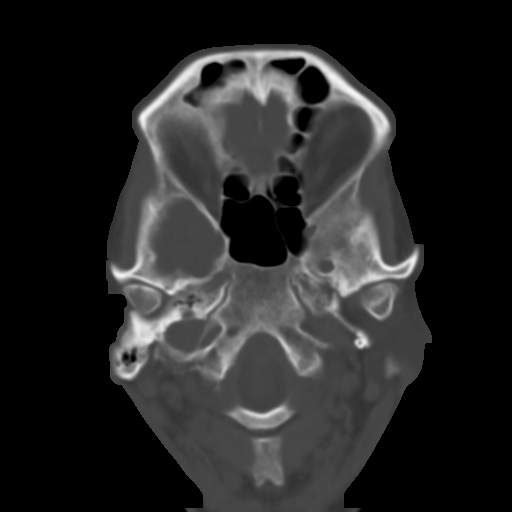
[im 8/32  brain]
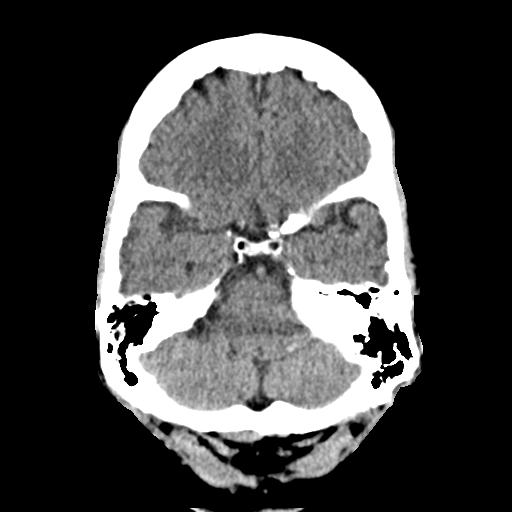
[im 12/32  brain]
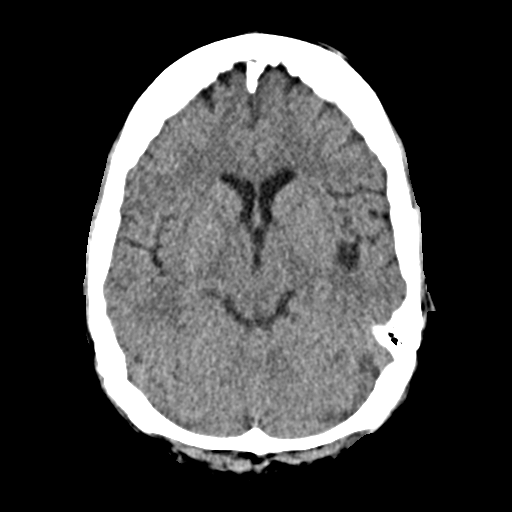
[im 16/32  brain]
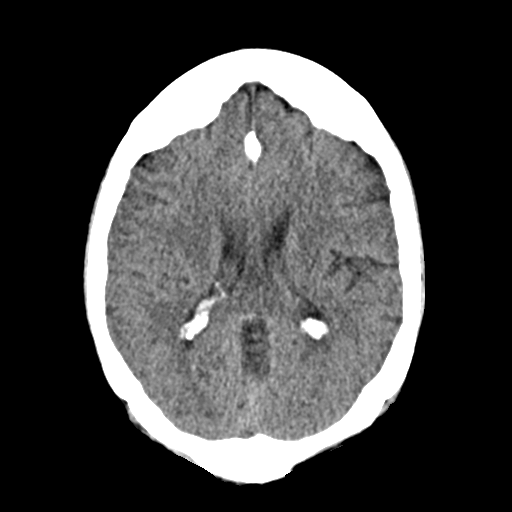
[im 20/32  brain]
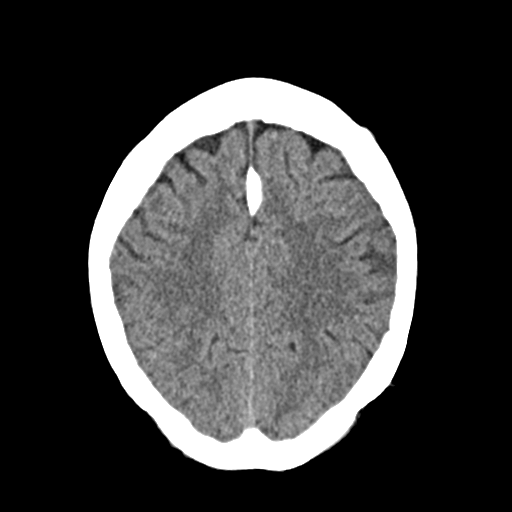
[im 20/32  bone]
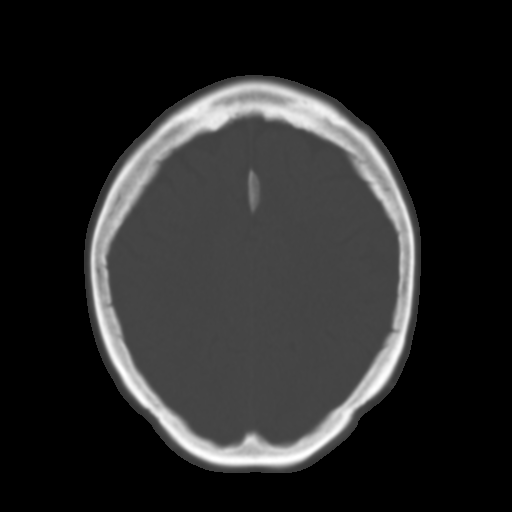
[im 24/32  brain]
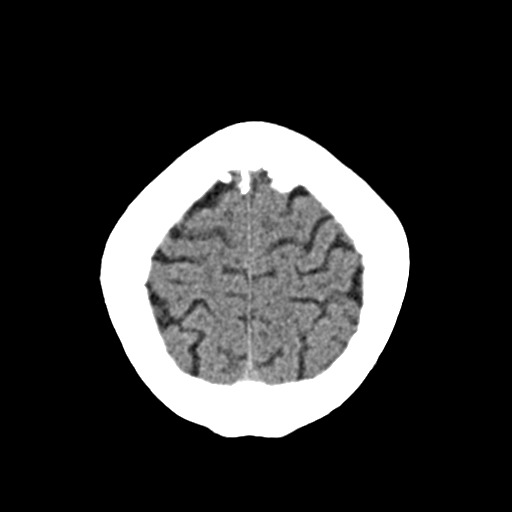
[im 28/32  brain]
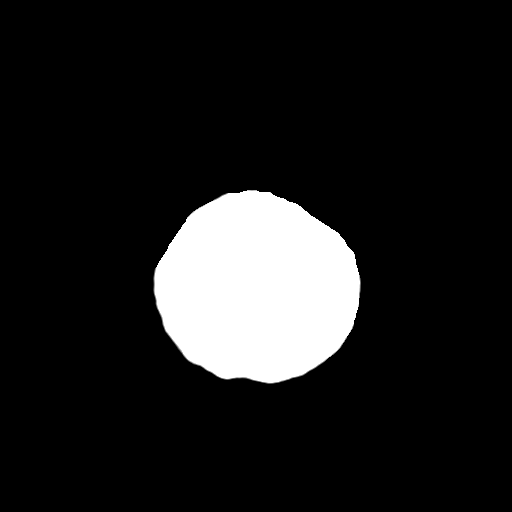

[Series 3: head bone · axial · 0.40mm/px · z∈[-169,-41]mm · 8 of 80 slices shown]
[im 8/80  bone]
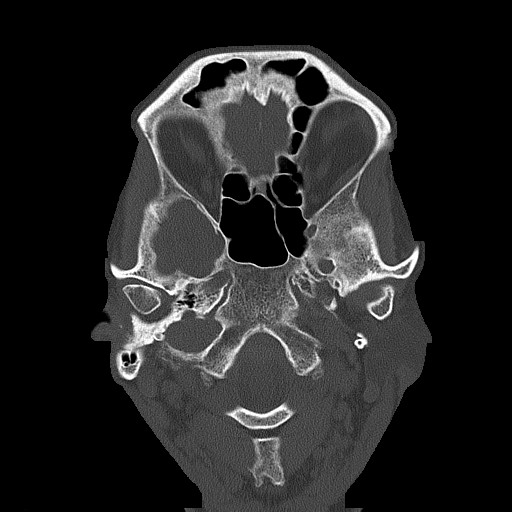
[im 16/80  bone]
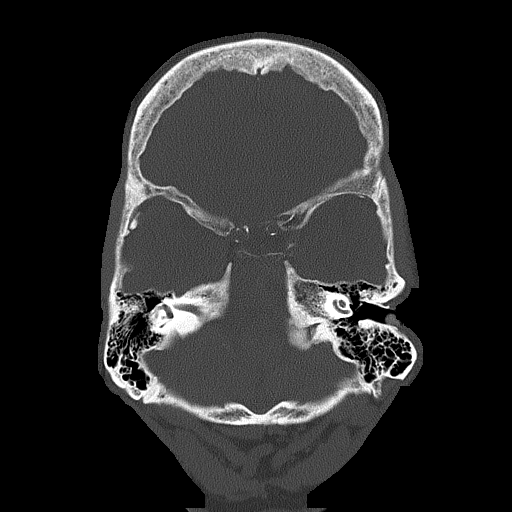
[im 24/80  bone]
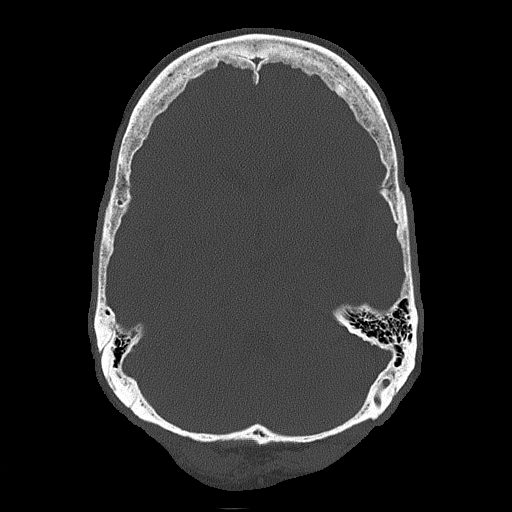
[im 36/80  bone]
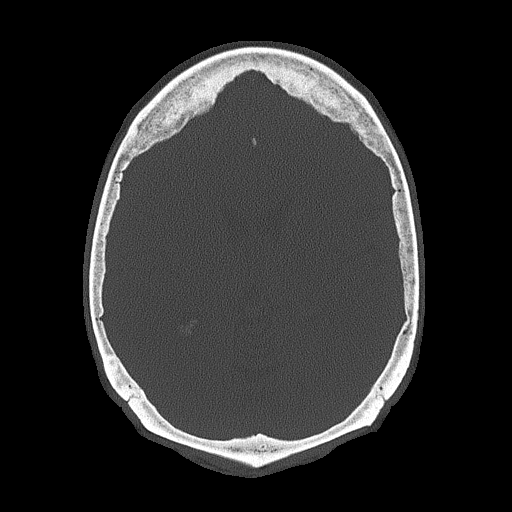
[im 44/80  bone]
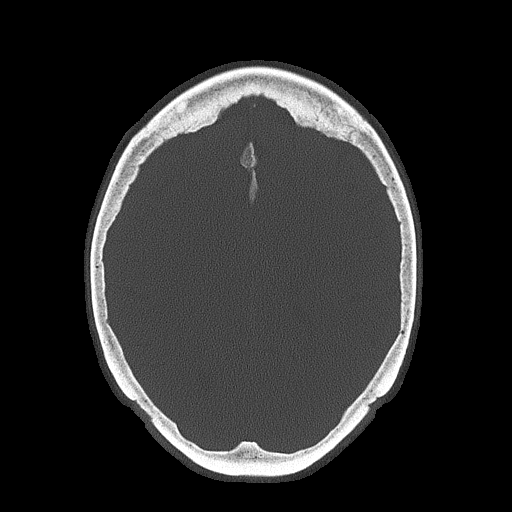
[im 56/80  bone]
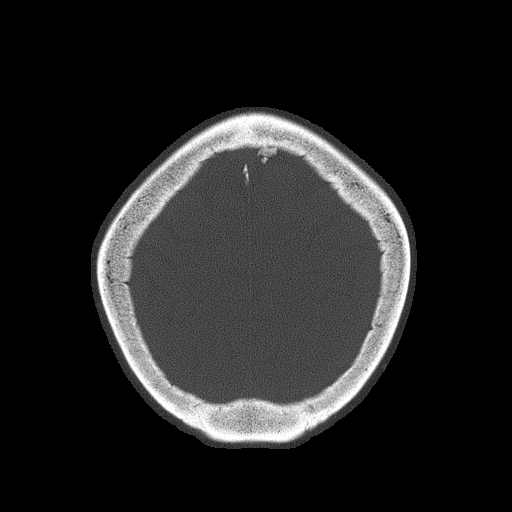
[im 64/80  bone]
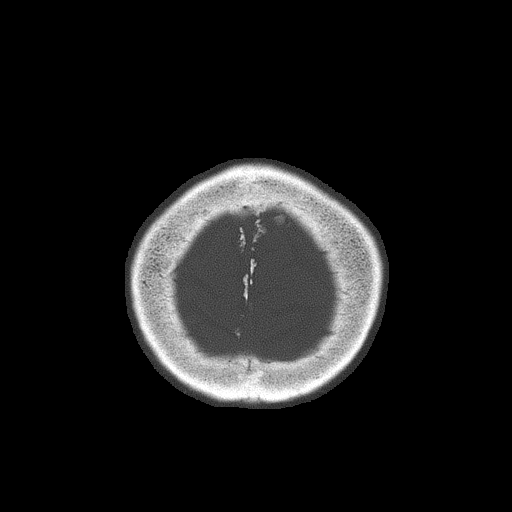
[im 72/80  bone]
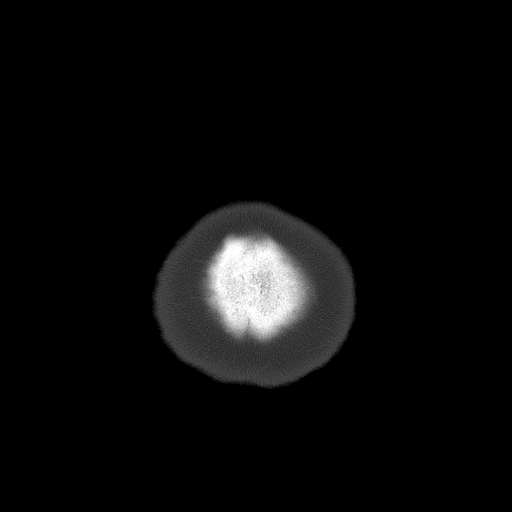

[15 of 30 positions shown; findings below may reference images not displayed]

FINDINGS: Brain: No evidence of acute infarction, hemorrhage, hydrocephalus,
extra-axial collection or mass lesion/mass effect. Prior infarct in
the left insula is again seen and stable.

Vascular: No hyperdense vessel or unexpected calcification.

Skull: Normal. Negative for fracture or focal lesion.

Sinuses/Orbits: No acute finding.

Other: None.
IMPRESSION: No acute intracranial abnormality noted.

## 2021-09-20 ENCOUNTER — Telehealth: Payer: Self-pay | Admitting: Cardiology

## 2021-09-20 ENCOUNTER — Other Ambulatory Visit: Payer: Self-pay

## 2021-09-20 MED ORDER — LISINOPRIL 20 MG PO TABS: ORAL_TABLET | ORAL | 0 refills | Status: DC

## 2021-09-20 NOTE — Telephone Encounter (Signed)
Elizabeth Bo, RN called pt and clarified pt is not and has not been taking Entresto - only Lisinopril 20 mg daily as ordered.  Pt is aware refill for Lisinopril was sent into her pharmacy as requested

## 2021-09-20 NOTE — Telephone Encounter (Signed)
Pt is requesting to stop Entresto, because her pharmacy is unable to find medication. Please address

## 2021-09-20 NOTE — Telephone Encounter (Signed)
Riesa Pope, RN, pt's medication was sent to pt's pharmacy as requested. Please disregard previous message. Thanks confirmation received. FYI

## 2021-09-20 NOTE — Telephone Encounter (Signed)
Pt c/o medication issue:  1. Name of Medication: lisinopril (ZESTRIL) 20 MG tablet  2. How are you currently taking this medication (dosage and times per day)? TAKE 1 TABLET BY MOUTH ONCE DAILY STOP ENTRESTO  3. Are you having a reaction (difficulty breathing--STAT)? no  4. What is your medication issue? Patient states she is having a hard time filling her prescription. She state that her pharmacy cant get the medication filled. Please advise

## 2021-10-19 ENCOUNTER — Encounter (HOSPITAL_BASED_OUTPATIENT_CLINIC_OR_DEPARTMENT_OTHER): Payer: Self-pay

## 2021-10-19 ENCOUNTER — Other Ambulatory Visit: Payer: Self-pay

## 2021-10-19 ENCOUNTER — Emergency Department (HOSPITAL_BASED_OUTPATIENT_CLINIC_OR_DEPARTMENT_OTHER)
Admission: EM | Admit: 2021-10-19 | Discharge: 2021-10-19 | Disposition: A | Discharge status: Home / Self Care | Payer: Medicare Other | Attending: Emergency Medicine | Admitting: Emergency Medicine

## 2021-10-19 DIAGNOSIS — I110 Hypertensive heart disease with heart failure: Secondary | ICD-10-CM | POA: Diagnosis not present

## 2021-10-19 DIAGNOSIS — I5022 Chronic systolic (congestive) heart failure: Secondary | ICD-10-CM | POA: Diagnosis not present

## 2021-10-19 DIAGNOSIS — Z87891 Personal history of nicotine dependence: Secondary | ICD-10-CM | POA: Insufficient documentation

## 2021-10-19 DIAGNOSIS — E119 Type 2 diabetes mellitus without complications: Secondary | ICD-10-CM | POA: Diagnosis not present

## 2021-10-19 DIAGNOSIS — R42 Dizziness and giddiness: Secondary | ICD-10-CM | POA: Diagnosis present

## 2021-10-19 LAB — COMPREHENSIVE METABOLIC PANEL
ALT: 15 U/L (ref 0–44)
AST: 18 U/L (ref 15–41)
Albumin: 4.2 g/dL (ref 3.5–5.0)
Alkaline Phosphatase: 82 U/L (ref 38–126)
Anion gap: 8 (ref 5–15)
BUN: 15 mg/dL (ref 8–23)
CO2: 28 mmol/L (ref 22–32)
Calcium: 9.7 mg/dL (ref 8.9–10.3)
Chloride: 103 mmol/L (ref 98–111)
Creatinine, Ser: 0.66 mg/dL (ref 0.44–1.00)
GFR, Estimated: >60 mL/min (ref >60)
Glucose, Bld: 104 mg/dL — ABNORMAL HIGH (ref 70–99)
Potassium: 4 mmol/L (ref 3.5–5.1)
Sodium: 139 mmol/L (ref 135–145)
Total Bilirubin: 1.2 mg/dL (ref 0.3–1.2)
Total Protein: 7.6 g/dL (ref 6.5–8.1)

## 2021-10-19 LAB — CBC WITH DIFFERENTIAL/PLATELET
Abs Immature Granulocytes: 0.01 10*3/uL (ref 0.00–0.07)
Basophils Absolute: 0 10*3/uL (ref 0.0–0.1)
Basophils Relative: 1 %
Eosinophils Absolute: 0.1 10*3/uL (ref 0.0–0.5)
Eosinophils Relative: 1 %
HCT: 37.2 % (ref 36.0–46.0)
Hemoglobin: 12.7 g/dL (ref 12.0–15.0)
Immature Granulocytes: 0 %
Lymphocytes Relative: 31 %
Lymphs Abs: 1.5 10*3/uL (ref 0.7–4.0)
MCH: 34.2 pg — ABNORMAL HIGH (ref 26.0–34.0)
MCHC: 34.1 g/dL (ref 30.0–36.0)
MCV: 100.3 fL — ABNORMAL HIGH (ref 80.0–100.0)
Monocytes Absolute: 0.4 10*3/uL (ref 0.1–1.0)
Monocytes Relative: 8 %
Neutro Abs: 2.9 10*3/uL (ref 1.7–7.7)
Neutrophils Relative %: 59 %
Platelets: 217 10*3/uL (ref 150–400)
RBC: 3.71 MIL/uL — ABNORMAL LOW (ref 3.87–5.11)
RDW: 11.7 % (ref 11.5–15.5)
WBC: 4.9 10*3/uL (ref 4.0–10.5)
nRBC: 0 % (ref 0.0–0.2)

## 2021-10-19 LAB — URINALYSIS, ROUTINE W REFLEX MICROSCOPIC
Bilirubin Urine: NEGATIVE
Glucose, UA: NEGATIVE mg/dL
Hgb urine dipstick: NEGATIVE
Ketones, ur: NEGATIVE mg/dL
Leukocytes,Ua: NEGATIVE
Nitrite: NEGATIVE
Protein, ur: NEGATIVE mg/dL
Specific Gravity, Urine: 1.01 (ref 1.005–1.030)
pH: 6 (ref 5.0–8.0)

## 2021-10-19 MED ORDER — MECLIZINE HCL 25 MG PO TABS: 25.0000 mg | ORAL_TABLET | Freq: Three times a day (TID) | ORAL | 0 refills | Status: AC | PRN

## 2021-10-19 MED ORDER — MECLIZINE HCL 25 MG PO TABS
25.0000 mg | ORAL_TABLET | Freq: Once | ORAL | Status: AC
  Administered 2021-10-19: 12:00:00 25 mg via ORAL
  Filled 2021-10-19: qty 1

## 2021-10-19 MED ORDER — SODIUM CHLORIDE 0.9 % IV BOLUS
500.0000 mL | Freq: Once | INTRAVENOUS | Status: AC
  Administered 2021-10-19: 13:00:00 500 mL via INTRAVENOUS

## 2021-10-19 NOTE — ED Triage Notes (Addendum)
Pt states she woke ~730am feeling dizzy and weak "no energy"-states dizziness is worse when she lies down and sits up "spinning"-pt states she felt fine when she went to bed ~12am-NAD-steady gait

## 2021-10-19 NOTE — ED Provider Notes (Signed)
Emergency Department Provider Note   I have reviewed the triage vital signs and the nursing notes.   HISTORY  Chief Complaint Dizziness   HPI Elizabeth Bowman is a 70 y.o. female with past medical history reviewed below presents to the emergency department with spinning/vertigo symptoms with sitting up or laying back.  Patient is able to lay still with no symptoms.  She does not have symptoms with turning her head left or right.  No tinnitus or pain in the ears.  Denies any unilateral weakness or numbness.  No difficulty with walking.  No vomiting.  She does have history of vertigo in the past but has run out of her meclizine.  She did not experience chest pain/pressure or heart palpitations.  No fevers or chills.  No upper respiratory infection symptoms.  No nasal congestion.   Past Medical History:  Diagnosis Date   Aortic atherosclerosis (HCC) 07/20/2018   Noted on CT in 2018   Complication of anesthesia    Diabetes mellitus (HCC)    Former tobacco use    HTN (hypertension)    Hyperlipidemia    Stroke [facility])     Patient Active Problem List   Diagnosis Date Noted   Cryptogenic stroke (HCC) 04/23/2019   Chronic systolic CHF (congestive heart failure) (HCC) 07/20/2018   Aortic atherosclerosis (HCC) 07/20/2018   Essential hypertension 07/20/2018   Nonischemic cardiomyopathy (HCC) 09/08/2017   Abnormal nuclear stress test 09/08/2017   Aphasia    History of stroke 06/18/2017   Diabetes mellitus without complication (HCC) 06/18/2017   Diabetes mellitus type 2 in nonobese (HCC) 06/18/2017   HLD (hyperlipidemia) 06/18/2017    Past Surgical History:  Procedure Laterality Date   LEFT HEART CATH AND CORONARY ANGIOGRAPHY N/A 09/08/2017   Procedure: LEFT HEART CATH AND CORONARY ANGIOGRAPHY;  Surgeon: Swaziland, Peter M, MD;  Location: [facility] INVASIVE CV LAB;  Service: Cardiovascular;  Laterality: N/A;   LOOP RECORDER INSERTION N/A 06/21/2017   Procedure: LOOP RECORDER INSERTION;  Surgeon:  Marinus Maw, MD;  Location: [facility] INVASIVE CV LAB;  Service: Cardiovascular;  Laterality: N/A;   TEE WITHOUT CARDIOVERSION N/A 06/21/2017   Procedure: TRANSESOPHAGEAL ECHOCARDIOGRAM (TEE);  Surgeon: Lars Masson, MD;  Location: [facility] ENDOSCOPY;  Service: Cardiovascular;  Laterality: N/A;    Allergies Patient has no known allergies.  Family History  Problem Relation Age of Onset   Cancer Father    Cancer Brother    CAD Neg Hx    Heart attack Neg Hx    Congestive Heart Failure Neg Hx     Social History Social History   Tobacco Use   Smoking status: Former    Types: Cigarettes    Quit date: 06/19/2007    Years since quitting: 14.3   Smokeless tobacco: Current   Tobacco comments:    Smoked for 20-30 years  Vaping Use   Vaping Use: Never used  Substance Use Topics   Alcohol use: Yes    Comment: occ   Drug use: No    Review of Systems  Constitutional: No fever/chills Eyes: No visual changes. ENT: No sore throat. Positive vertigo.  Cardiovascular: Denies chest pain. Respiratory: Denies shortness of breath. Gastrointestinal: No abdominal pain.  No nausea, no vomiting.  No diarrhea.  No constipation. Genitourinary: Negative for dysuria. Musculoskeletal: Negative for back pain. Skin: Negative for rash. Neurological: Negative for headaches, focal weakness or numbness.  10-point ROS otherwise negative.  ____________________________________________   PHYSICAL EXAM:  VITAL SIGNS: ED Triage Vitals  Enc Vitals Group     BP 10/19/21 1139 (!) 179/87     Pulse Rate 10/19/21 1139 65     Resp 10/19/21 1139 16     Temp 10/19/21 1139 98 F (36.7 C)     Temp Source 10/19/21 1139 Oral     SpO2 10/19/21 1139 100 %     Weight 10/19/21 1136 155 lb (70.3 kg)     Height 10/19/21 1136 5\' 7"  (1.702 m)    Constitutional: Alert and oriented. Well appearing and in no acute distress. Eyes: Conjunctivae are normal. PERRL. EOMI. No nystagmus.  Head: Atraumatic. Nose: No  congestion/rhinnorhea. Mouth/Throat: Mucous membranes are moist.  Neck: No stridor.   Cardiovascular: Normal rate, regular rhythm. Good peripheral circulation. Grossly normal heart sounds.   Respiratory: Normal respiratory effort.  No retractions. Lungs CTAB. Gastrointestinal: Soft and nontender. No distention.  Musculoskeletal: No lower extremity tenderness nor edema. No gross deformities of extremities. Neurologic:  Normal speech and language. No gross focal neurologic deficits are appreciated.  No facial asymmetry.  Equal strength and sensation in the bilateral upper and lower extremities.  Normal finger-to-nose testing with the bilateral uppers.  Normal heel-to-shin bilaterally.  Skin:  Skin is warm, dry and intact. No rash noted.   ____________________________________________   LABS (all labs ordered are listed, but only abnormal results are displayed)  Labs Reviewed  COMPREHENSIVE METABOLIC PANEL - Abnormal; Notable for the following components:      Result Value   Glucose, Bld 104 (*)    All other components within normal limits  CBC WITH DIFFERENTIAL/PLATELET - Abnormal; Notable for the following components:   RBC 3.71 (*)    MCV 100.3 (*)    MCH 34.2 (*)    All other components within normal limits  URINALYSIS, ROUTINE W REFLEX MICROSCOPIC - Abnormal; Notable for the following components:   Color, Urine STRAW (*)    All other components within normal limits   ____________________________________________  EKG   EKG Interpretation  Date/Time:  Tuesday October 19 2021 11:52:48 EST Ventricular Rate:  68 PR Interval:  179 QRS Duration: 104 QT Interval:  407 QTC Calculation: 433 R Axis:   46 Text Interpretation: Sinus rhythm Left ventricular hypertrophy  Nonspecific ST changes. Similar to prior tracing in April. Confirmed by Alona Bene [phone]) on 10/19/2021 11:57:35 AM        ____________________________________________  RADIOLOGY  None    ____________________________________________   PROCEDURES  Procedure(s) performed:   Procedures  None  ____________________________________________   INITIAL IMPRESSION / ASSESSMENT AND PLAN / ED COURSE  Pertinent labs & imaging results that were available during my care of the patient were reviewed by me and considered in my medical decision making (see chart for details).   Patient presents to the emergency department with vertigo symptoms.  The symptoms are positional but not reproducible in the emergency department.  Neurologically, the patient has a completely intact neurologic exam including coordination.  My suspicion for central process is very low.  Plan for screening blood work and gentle IV fluids along with meclizine and will ambulate.  Do not see indication for advanced neuroimaging at this time.   Lab work here reviewed and reassuring.  Patient is feeling improved after meclizine.  She continues to have an intact neurologic exam.  She is able to ambulate around the emergency department without ataxia.  Plan for referral to ENT for vestibular rehab/assessment.  Exceedingly low suspicion for stroke or other central process.  Plan for discharge.  Patient comfortable with plan and husband is here to drive her home.  ____________________________________________  FINAL CLINICAL IMPRESSION(S) / ED DIAGNOSES  Final diagnoses:  Vertigo     MEDICATIONS GIVEN DURING THIS VISIT:  Medications  sodium chloride 0.9 % bolus 500 mL (0 mLs Intravenous Stopped 10/19/21 1426)  meclizine (ANTIVERT) tablet 25 mg (25 mg Oral Given 10/19/21 1215)     Note:  This document was prepared using Dragon voice recognition software and may include unintentional dictation errors.  Nanda Quinton, MD, [facility] Emergency Medicine    Lionell Matuszak, [facility], MD 10/20/21 [phone]

## 2021-10-19 NOTE — ED Notes (Signed)
Pt discharged to home. Discharge instructions have been discussed with patient and/or family members. Pt verbally acknowledges understanding d/c instructions, and endorses comprehension to checkout at registration before leaving.  °

## 2021-10-19 NOTE — Discharge Instructions (Signed)
We believe your symptoms were caused by benign vertigo.  Please read through the included information and take any prescribed medication(s).  Follow up with your doctor as listed above.  If you develop any new or worsening symptoms that concern you, including but not limited to persistent dizziness/vertigo, numbness or weakness in your arms or legs, altered mental status, persistent vomiting, or fever greater than 101, please return immediately to the Emergency Department.  

## 2021-12-03 ENCOUNTER — Encounter: Payer: Self-pay | Admitting: Cardiology

## 2021-12-03 ENCOUNTER — Encounter (INDEPENDENT_AMBULATORY_CARE_PROVIDER_SITE_OTHER): Payer: Self-pay

## 2021-12-03 ENCOUNTER — Other Ambulatory Visit: Payer: Self-pay

## 2021-12-03 ENCOUNTER — Ambulatory Visit (INDEPENDENT_AMBULATORY_CARE_PROVIDER_SITE_OTHER): Payer: Medicare Other | Admitting: Cardiology

## 2021-12-03 DIAGNOSIS — I5022 Chronic systolic (congestive) heart failure: Secondary | ICD-10-CM | POA: Diagnosis not present

## 2021-12-03 DIAGNOSIS — I700 Atherosclerosis of aorta: Secondary | ICD-10-CM

## 2021-12-03 DIAGNOSIS — E785 Hyperlipidemia, unspecified: Secondary | ICD-10-CM

## 2021-12-03 DIAGNOSIS — I2510 Atherosclerotic heart disease of native coronary artery without angina pectoris: Secondary | ICD-10-CM | POA: Diagnosis not present

## 2021-12-03 DIAGNOSIS — I639 Cerebral infarction, unspecified: Secondary | ICD-10-CM

## 2021-12-03 MED ORDER — CARVEDILOL 6.25 MG PO TABS: 6.2500 mg | ORAL_TABLET | Freq: Two times a day (BID) | ORAL | 3 refills | Status: DC

## 2021-12-03 NOTE — Assessment & Plan Note (Addendum)
Previous implantable loop recorder in place.  She did ask about potentially removing this.  If she decides to go forward with this we will set her up with EP for removal.  She has had breast cancer treatments in the past.   has been on Plavix because of prior stroke.

## 2021-12-03 NOTE — Assessment & Plan Note (Signed)
Noted on prior CT scan 2018.  Continue with statins.

## 2021-12-03 NOTE — Assessment & Plan Note (Signed)
On atorvastatin 80 mg once a day.  LDL goal should be less than 55 with prior stroke and other comorbidities.

## 2021-12-03 NOTE — Progress Notes (Signed)
Cardiology Office Note:    Date:  12/03/2021   ID:  Elizabeth Bowman, DOB Oct 04, 1951, MRN [id number]  PCP:  Concepcion Elk, MD   [facility] Providers Cardiologist:  Candee Furbish, MD     Referring MD: Concepcion Elk, MD    History of Present Illness:    Elizabeth Bowman is a 71 y.o. female here for follow-up of chronic systolic heart failure prior ejection fraction 25 to 30%.  Heart catheterization showed nonobstructive CAD.  She had cryptogenic stroke in the past with implantable loop recorder.  Diabetes, PAD.  Could not afford Entresto.  In March 2020 she was admitted to [facility] with shortness of breath found to be hypoxic with demand ischemia.  Echo was 28%.  She saw Dr. Lovena Le with electrophysiology and since her EF was updated and was 40%, no ICD was planned.  Overall been doing quite well.  Main complaint is a bit of fatigue.  No significant shortness of breath.  Sometimes she will skip her furosemide when she has to go out.  no fevers chills nausea vomiting syncope bleeding.  Paraophthalmic artery aneurysm previously noted.  Past Medical History:  Diagnosis Date   Aortic atherosclerosis ([place]) 07/20/2018   Noted on CT in 99991111   Complication of anesthesia    Diabetes mellitus ([place])    Former tobacco use    HTN (hypertension)    Hyperlipidemia    Stroke [facility])     Past Surgical History:  Procedure Laterality Date   LEFT HEART CATH AND CORONARY ANGIOGRAPHY N/A 09/08/2017   Procedure: LEFT HEART CATH AND CORONARY ANGIOGRAPHY;  Surgeon: Martinique, Peter M, MD;  Location: [place] [facility];  Service: Cardiovascular;  Laterality: N/A;   LOOP RECORDER INSERTION N/A 06/21/2017   Procedure: LOOP RECORDER INSERTION;  Surgeon: Evans Lance, MD;  Location: [place] [facility];  Service: Cardiovascular;  Laterality: N/A;   TEE WITHOUT CARDIOVERSION N/A 06/21/2017   Procedure: TRANSESOPHAGEAL ECHOCARDIOGRAM (TEE);  Surgeon: Dorothy Spark, MD;  Location: [facility] ENDOSCOPY;  Service:  Cardiovascular;  Laterality: N/A;    Current Medications: Current Meds  Medication Sig   anastrozole (ARIMIDEX) 1 MG tablet Take 1 mg by mouth daily.   atorvastatin (LIPITOR) 80 MG tablet Take 1 tablet (80 mg total) by mouth daily at 6 PM.   benzonatate (TESSALON) 100 MG capsule Take 1 capsule (100 mg total) by mouth 3 (three) times daily as needed for cough.   clopidogrel (PLAVIX) 75 MG tablet Take 1 tablet by mouth once daily   furosemide (LASIX) 40 MG tablet Take 1 tablet by mouth once daily   lisinopril (ZESTRIL) 20 MG tablet TAKE 1 TABLET BY MOUTH ONCE DAILY   meclizine (ANTIVERT) 25 MG tablet Take 1 tablet (25 mg total) by mouth 3 (three) times daily as needed for dizziness.   metFORMIN (GLUCOPHAGE) 500 MG tablet TAKE 1 TABLET BY MOUTH ONCE DAILY WITH BREAKFAST   pantoprazole (PROTONIX) 40 MG tablet Take 40 mg by mouth daily as needed (indigestion).   potassium chloride (K-DUR,KLOR-CON) 10 MEQ tablet Take 10 mEq by mouth 2 (two) times daily.    Vitamin D, Ergocalciferol, (DRISDOL) 1.25 MG (50000 UT) CAPS capsule Take 50,000 Units by mouth every 7 (seven) days.    [DISCONTINUED] carvedilol (COREG) 6.25 MG tablet Take 1 tablet (6.25 mg total) by mouth 2 (two) times daily with a meal. Please make yearly appt with Dr. Marlou Porch for October 2022 for future refills. Thank you 1st attempt  Allergies:   Patient has no known allergies.   Social History   Socioeconomic History   Marital status: Married    Spouse name: Not on file   Number of children: Not on file   Years of education: Not on file   Highest education level: Not on file  Occupational History   Not on file  Tobacco Use   Smoking status: Former    Types: Cigarettes    Quit date: 06/19/2007    Years since quitting: 14.4   Smokeless tobacco: Current   Tobacco comments:    Smoked for 20-30 years  Vaping Use   Vaping Use: Never used  Substance and Sexual Activity   Alcohol use: Yes    Comment: occ   Drug use: No    Sexual activity: Yes    Partners: Female  Other Topics Concern   Not on file  Social History Narrative   Not on file   Social Determinants of Health   Financial Resource Strain: Not on file  Food Insecurity: Not on file  Transportation Needs: Not on file  Physical Activity: Not on file  Stress: Not on file  Social Connections: Not on file     Family History: The patient's family history includes Cancer in her brother and father. There is no history of CAD, Heart attack, or Congestive Heart Failure.  ROS:   Please see the history of present illness.     All other systems reviewed and are negative.  EKGs/Labs/Other Studies Reviewed:    The following studies were reviewed today: Echo 2021-EF 40%  Carotid 2019-mild bilaterally  Nuclear stress test 2018-EF 36%  Recent Labs: 10/19/2021: ALT 15; BUN 15; Creatinine, Ser 0.66; Hemoglobin 12.7; Platelets 217; Potassium 4.0; Sodium 139  Recent Lipid Panel    Component Value Date/Time   CHOL 152 10/07/2019 1358   TRIG 97 10/07/2019 1358   HDL 51 10/07/2019 1358   CHOLHDL 3.0 10/07/2019 1358   CHOLHDL 3.8 06/19/2017 0310   VLDL 16 06/19/2017 0310   LDLCALC 83 10/07/2019 1358     Risk Assessment/Calculations:              Physical Exam:    VS:  BP 120/60 (BP Location: Left Arm, Patient Position: Sitting, Cuff Size: Normal)    Pulse 62    Ht 5\' 7"  (1.702 m)    Wt 163 lb (73.9 kg)    SpO2 98%    BMI 25.53 kg/m     Wt Readings from Last 3 Encounters:  12/03/21 163 lb (73.9 kg)  10/19/21 155 lb (70.3 kg)  02/09/21 145 lb (65.8 kg)     GEN:  Well nourished, well developed in no acute distress HEENT: Normal NECK: No JVD; No carotid bruits LYMPHATICS: No lymphadenopathy CARDIAC: RRR, no murmurs, no rubs, gallops RESPIRATORY:  Clear to auscultation without rales, wheezing or rhonchi  ABDOMEN: Soft, non-tender, non-distended MUSCULOSKELETAL:  No edema; No deformity  SKIN: Warm and dry NEUROLOGIC:  Alert and  oriented x 3 PSYCHIATRIC:  Normal affect   ASSESSMENT:    1. Chronic systolic CHF (congestive heart failure) ([place])   2. Aortic atherosclerosis ([place])   3. Coronary artery disease involving native coronary artery of native heart without angina pectoris   4. Cryptogenic stroke ([place])   5. Hyperlipidemia, unspecified hyperlipidemia type    PLAN:    In order of problems listed above:  Chronic systolic CHF (congestive heart failure) ([place]) Most recent EF 40%.  Overall euvolemic.  Continuing with  goal-directed medical therapy which includes Coreg 6.25 mg twice a day, lisinopril 20 mg a day.  Unable to afford Entresto.  Aortic atherosclerosis ([place]) Noted on prior CT scan 2018.  Continue with statins.  Coronary artery disease involving native coronary artery of native heart without angina pectoris Nonobstructive CAD.  No worrisome symptoms.  Continue with goal-directed therapy.  Cryptogenic stroke ([place]) Previous implantable loop recorder in place.  She did ask about potentially removing this.  If she decides to go forward with this we will set her up with EP for removal.  She has had breast cancer treatments in the past.   has been on Plavix because of prior stroke.  HLD (hyperlipidemia) On atorvastatin 80 mg once a day.  LDL goal should be less than 55 with prior stroke and other comorbidities.     Months 6 months APP follow-up   Medication Adjustments/Labs and Tests Ordered: Current medicines are reviewed at length with the patient today.  Concerns regarding medicines are outlined above.  No orders of the defined types were placed in this encounter.  Meds ordered this encounter  Medications   carvedilol (COREG) 6.25 MG tablet    Sig: Take 1 tablet (6.25 mg total) by mouth 2 (two) times daily with a meal.    Dispense:  180 tablet    Refill:  3    Patient Instructions  Medication Instructions:  The current medical regimen is effective;  continue present plan and  medications.  *If you need a refill on your cardiac medications before your next appointment, please call your pharmacy*  Follow-Up: At [facility], you and your health needs are our priority.  As part of our continuing mission to provide you with exceptional heart care, we have created designated Provider Care Teams.  These Care Teams include your primary Cardiologist (physician) and Advanced Practice Providers (APPs -  Physician Assistants and Nurse Practitioners) who all work together to provide you with the care you need, when you need it.  We recommend signing up for the patient portal called "MyChart".  Sign up information is provided on this After Visit Summary.  MyChart is used to connect with patients for Virtual Visits (Telemedicine).  Patients are able to view lab/test results, encounter notes, upcoming appointments, etc.  Non-urgent messages can be sent to your provider as well.   To learn more about what you can do with MyChart, go to [email or web address].    Your next appointment:   6 month(s)  The format for your next appointment:   In Person  Provider:   Nicholes Rough, PA-C, Melina Copa, PA-C, Ermalinda Barrios, PA-C, Christen Bame, NP, or Richardson Dopp, PA-C     Then, Candee Furbish, MD will plan to see you again in 1 year(s).   Thank you for choosing [facility]!!      Signed, Candee Furbish, MD  12/03/2021 10:45 AM    [place]

## 2021-12-03 NOTE — Patient Instructions (Signed)
Medication Instructions:  The current medical regimen is effective;  continue present plan and medications.  *If you need a refill on your cardiac medications before your next appointment, please call your pharmacy*  Follow-Up: At [facility], you and your health needs are our priority.  As part of our continuing mission to provide you with exceptional heart care, we have created designated Provider Care Teams.  These Care Teams include your primary Cardiologist (physician) and Advanced Practice Providers (APPs -  Physician Assistants and Nurse Practitioners) who all work together to provide you with the care you need, when you need it.  We recommend signing up for the patient portal called "MyChart".  Sign up information is provided on this After Visit Summary.  MyChart is used to connect with patients for Virtual Visits (Telemedicine).  Patients are able to view lab/test results, encounter notes, upcoming appointments, etc.  Non-urgent messages can be sent to your provider as well.   To learn more about what you can do with MyChart, go to [email or web address].    Your next appointment:   6 month(s)  The format for your next appointment:   In Person  Provider:   Nicholes Rough, PA-C, Melina Copa, PA-C, Ermalinda Barrios, PA-C, Christen Bame, NP, or Richardson Dopp, PA-C     Then, Candee Furbish, MD will plan to see you again in 1 year(s).   Thank you for choosing [facility]!!

## 2021-12-03 NOTE — Assessment & Plan Note (Signed)
Most recent EF 40%.  Overall euvolemic.  Continuing with goal-directed medical therapy which includes Coreg 6.25 mg twice a day, lisinopril 20 mg a day.  Unable to afford Entresto.

## 2021-12-03 NOTE — Assessment & Plan Note (Signed)
Nonobstructive CAD.  No worrisome symptoms.  Continue with goal-directed therapy.

## 2021-12-20 ENCOUNTER — Other Ambulatory Visit: Payer: Self-pay

## 2021-12-20 MED ORDER — LISINOPRIL 20 MG PO TABS: ORAL_TABLET | ORAL | 3 refills | Status: DC

## 2022-03-07 ENCOUNTER — Other Ambulatory Visit: Payer: Self-pay | Admitting: Cardiology

## 2022-07-12 ENCOUNTER — Ambulatory Visit: Payer: Medicare Other | Admitting: Nurse Practitioner

## 2022-07-15 ENCOUNTER — Encounter: Payer: Self-pay | Admitting: Physician Assistant

## 2022-07-15 NOTE — Progress Notes (Unsigned)
Cardiology Office Note    Date:  07/18/2022   ID:  TREAZURE NERY, DOB 1951/05/23, MRN [id number]  PCP:  Ardyth Gal, MD  Cardiologist:  Donato Schultz, MD  Electrophysiologist:  None   Chief Complaint: f/u CHF  History of Present Illness:   Elizabeth Bowman is a 71 y.o. 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  She was initially seen by cardiology during 2018 admission for stroke and found to have severe LV dysfunction. . CT angio head/neck possible acute left temporal infarct, severe stenosis of the L M2 segment, bilateral paraophthalmic wide necked 4 mm aneurysms, moderate stenosis of right cavernous internal carotid artery; MR brain showed small acute L MCA infarct. She was outside of the window for TPA. Echo showed EF 30-35%. TEE without source of emboli. She underwent loop recorder and eventual heart cath 08/2017 that showed minor nonobstructive CAD, 20% ost-dist LAD, 10% Cx, 10% OM1. ILR stopped transmitting in 2021 but had not had any evidence of atrial fib. She could not previously afford Entresto. Neuro had ordered repeat head imaging in 2020 to f/u her paraophthalmic artery aneurysms but does not look like she ever had this done. She is on Plavix at direction of neuro due to stroke. Last echo 12/2019 showed EF 40%, G1DD, aortic sclerosis without stenosis -> not felt to require ICD since EF >35%. Lipids appear to be managed by outside provider.  She returns to clinic today with her husband overall doing well except has noticed some fatigue the last few weeks. She wonders if she is anemic or just due to not getting enough sleep. No CP, SOB, dizziness, edema, orthopnea, or bleeding. Reports that after her labs in 05/2022, she started taking potassium supplement and has been on it about a week. Initial BP  136/68 with recheck 118/72 by me. Denies family hx of cardiomyopathy.   Labwork independently reviewed: 05/2022 Hgb 12.6, plt 225, K 3.4, Cr 0.89, LFTs ok 05/2021 LDL 70, trig 81 (outside provider), A1c 5.4 12/2020 TSH wnl  Cardiology Studies:   Studies reviewed are outlined and summarized above. Reports included below if pertinent.   2d echo 12/2019   1. Left ventricular ejection fraction, by estimation, is 40%. The left  ventricle has mildly decreased function. The left ventricle demonstrates  global hypokinesis. The left ventricular internal cavity size was severely  dilated. Left ventricular  diastolic parameters are consistent with Grade I diastolic dysfunction  (impaired relaxation). Elevated left ventricular end-diastolic pressure.  The average left ventricular global longitudinal strain is -15.7 %.   2. Right ventricular systolic function is normal. The right ventricular  size is normal.   3. The mitral valve is normal in structure. Trivial mitral valve  regurgitation. No evidence of mitral stenosis.   4. The aortic valve is tricuspid. Aortic valve regurgitation is not  visualized. Mild to moderate aortic valve sclerosis/calcification is  present, without any evidence of aortic stenosis.   5. The inferior vena cava is normal in size with greater than 50%  respiratory variability, suggesting right atrial pressure of 3 mmHg.   Comparison(s): Left ventricle: The cavity size was mildly dilated. There  was mild concentric hypertrophy. Systolic function was moderately to  severely reduced. The estimated ejection fraction was in the range of 30%  to 35%. Diffuse hypokinesis.   Carotid US 05/2018  Final Interpretation:  Right Carotid: Velocities in the right ICA are consistent with a 1-39%  stenosis.   Left Carotid: Velocities in the left ICA are consistent with a 1-39%  stenosis.   Vertebrals: Bilateral vertebral arteries demonstrate antegrade flow.   *See table(s) above  for measurements and observations.       Electronically signed by Delia Heady MD on 06/20/2018 at 4:36:07 PM.      Cath 2018 Ost LAD to Dist LAD lesion is 20% stenosed. Ost Cx to Prox Cx lesion is 10% stenosed with 10% stenosed side branch in Ost 1st Mrg. There is severe left ventricular systolic dysfunction. LV end diastolic pressure is normal. The left ventricular ejection fraction is 25-35% by visual estimate.   1. Minor nonobstructive CAD 2. Severe LV dysfunction 3. Normal LVEDP   Plan: medical management.    TEE 05/2017 Study Conclusions   - Left ventricle: The cavity size was mildly dilated. There was    mild concentric hypertrophy. Systolic function was moderately to    severely reduced. The estimated ejection fraction was in the    range of 30% to 35%. Diffuse hypokinesis.  - Aortic valve: No evidence of vegetation.  - Mitral valve: There was mild regurgitation.  - Left atrium: No evidence of thrombus in the atrial cavity or    appendage. No evidence of thrombus in the atrial cavity or    appendage. No evidence of thrombus in the appendage.  - Right atrium: The atrium was dilated. No evidence of thrombus in    the atrial cavity or appendage.  - Atrial septum: No defect or patent foramen ovale was identified.    Echo contrast study showed no right-to-left atrial level shunt,    following an increase in RA pressure induced by provocative    maneuvers.  - Pulmonic valve: No evidence of vegetation.   Impressions:   - No cardiac source of emboli was indentified.     Past Medical History:  Diagnosis Date   Aortic atherosclerosis (HCC) 07/20/2018   Noted on CT in 2018   Brain aneurysm    Chronic HFrEF (heart failure with reduced ejection fraction) (HCC)    Complication of anesthesia    Diabetes mellitus (HCC)    Former tobacco use    HTN (hypertension)    Hyperlipidemia    Mild CAD    NICM (nonischemic cardiomyopathy) (HCC)    Stroke [facility])     Past  Surgical History:  Procedure Laterality Date   LEFT HEART CATH AND CORONARY ANGIOGRAPHY N/A 09/08/2017   Procedure: LEFT HEART CATH AND CORONARY ANGIOGRAPHY;  Surgeon: Swazil[place], Peter M, MD;  Location: [facility] INVASIVE CV LAB;  Service: Cardiovascular;  Laterality: N/A;   LOOP RECORDER INSERTION N/A 06/21/2017   Procedure: LOOP RECORDER INSERTION;  Surgeon: Marinus Maw, MD;  Location: [facility] INVASIVE CV LAB;  Service: Cardiovascular;  Laterality: N/A;   TEE WITHOUT CARDIOVERSION N/A 06/21/2017   Procedure: TRANSESOPHAGEAL ECHOCARDIOGRAM (TEE);  Surgeon: Lars Masson, MD;  Location[facility] ENDOSCOPY;  Service: Cardiovascular;  Laterality: N/A;    Current Medications: Current Meds  Medication Sig   anastrozole (ARIMIDEX) 1 MG tablet Take 1 mg by mouth daily.   atorvastatin (LIPITOR) 80 MG tablet Take 1 tablet (80 mg total) by mouth daily at 6 PM.   benzonatate (TESSALON) 100 MG capsule Take 1 capsule (100 mg total) by mouth 3 (three) times daily as needed for cough.   carvedilol (COREG) 6.25 MG tablet Take 1 tablet (6.25  mg total) by mouth 2 (two) times daily with a meal.   clopidogrel (PLAVIX) 75 MG tablet Take 1 tablet by mouth once daily   furosemide (LASIX) 40 MG tablet Take 1 tablet by mouth once daily   lisinopril (ZESTRIL) 20 MG tablet TAKE 1 TABLET BY MOUTH ONCE DAILY   meclizine (ANTIVERT) 25 MG tablet Take 1 tablet (25 mg total) by mouth 3 (three) times daily as needed for dizziness.   metFORMIN (GLUCOPHAGE) 500 MG tablet TAKE 1 TABLET BY MOUTH ONCE DAILY WITH BREAKFAST   pantoprazole (PROTONIX) 40 MG tablet Take 40 mg by mouth daily as needed (indigestion).   Vitamin D, Ergocalciferol, (DRISDOL) 1.25 MG (50000 UT) CAPS capsule Take 50,000 Units by mouth every 7 (seven) days.    [DISCONTINUED] potassium chloride (K-DUR,KLOR-CON) 10 MEQ tablet Take 10 mEq by mouth 2 (two) times daily.       Allergies:   Patient has no known allergies.   Social History   Socioeconomic History   Marital  status: Married    Spouse name: Not on file   Number of children: Not on file   Years of education: Not on file   Highest education level: Not on file  Occupational History   Not on file  Tobacco Use   Smoking status: Former    Types: Cigarettes    Quit date: 06/19/2007    Years since quitting: 15.0   Smokeless tobacco: Current   Tobacco comments:    Smoked for 20-30 years  Vaping Use   Vaping Use: Never used  Substance and Sexual Activity   Alcohol use: Yes    Comment: occ   Drug use: No   Sexual activity: Yes    Partners: Female  Other Topics Concern   Not on file  Social History Narrative   Not on file   Social Determinants of Health   Financial Resource Strain: Not on file  Food Insecurity: Not on file  Transportation Needs: Not on file  Physical Activity: Not on file  Stress: Not on file  Social Connections: Not on file     Family History:  The patient's family history includes Cancer in her brother and father. There is no history of CAD, Heart attack, or Congestive Heart Failure.  ROS:   Please see the history of present illness.  All other systems are reviewed and otherwise negative.    EKG(s)/Additional Labs   EKG:  EKG is ordered today, personally reviewed, demonstrating NSR 63bpm, subtle STE II, avL, V4-V6, with biphasic TW in II, TWI III, avL. When compared to prior tracing the subtle STE has been seen in the past back to 2021, and the TWI appears more pronounced than previous tracing. Reviewed with Dr. Anne Fu.  Recent Labs: 10/19/2021: ALT 15; BUN 15; Creatinine, Ser 0.66; Hemoglobin 12.7; Platelets 217; Potassium 4.0; Sodium 139  Recent Lipid Panel    Component Value Date/Time   CHOL 152 10/07/2019 1358   TRIG 97 10/07/2019 1358   HDL 51 10/07/2019 1358   CHOLHDL 3.0 10/07/2019 1358   CHOLHDL 3.8 06/19/2017 0310   VLDL 16 06/19/2017 0310   LDLCALC 83 10/07/2019 1358    PHYSICAL EXAM:    VS:  BP 136/68   Pulse 63   Ht 5\' 7"  (1.702 m)    Wt 154 lb (69.9 kg)   SpO2 98%   BMI 24.12 kg/m   BMI: Body mass index is 24.12 kg/m.  GEN: Well nourished, well developed female in no acute distress  HEENT: normocephalic, atraumatic Neck: no JVD, carotid bruits, or masses Cardiac: RRR; no murmurs, rubs, or gallops, no edema  Respiratory:  clear to auscultation bilaterally, normal work of breathing GI: soft, nontender, nondistended, + BS MS: no deformity or atrophy Skin: warm and dry, no rash Neuro:  Alert and Oriented x 3, Strength and sensation are intact, follows commands Psych: euthymic mood, full affect  Wt Readings from Last 3 Encounters:  07/18/22 154 lb (69.9 kg)  12/03/21 163 lb (73.9 kg)  10/19/21 155 lb (70.3 kg)     ASSESSMENT & PLAN:   1. Chronic HFrEF/NICM - appears euvolemic on exam. Denies CP, SOB, orthopnea, edema, but does report some generalized fatigue. EKG appears more abnormal than prior, reviewed with Dr. Anne Fu, not felt to reflect ACS or pericarditis at this time. We will pursue cardiac MRI for further evaluation of NICM, to assess EF but also screen for infiltrative disease or hypertrophic cardiomyopathy. Patient denies any metal in her body and I confirmed with EP APP that MRI OK to get with prior ILR.She notes that her BP was higher earlier this year (180/82 in another office for example) but more recently trending lower. Will check labs to ensure no new electrolyte abnormalities or anemia before titrating meds. BP here 136/68 with recheck 118/72 by me. Will consider addition of spironolactone with f/u labwork with consideration of SGLT2i in follow-up. As above, she has not been able to afford Entresto as recently as 08/2021. Continue lisinopril, carvedilol and Lasix as she is tolerating these presently.  2. Mild CAD, aortic atherosclerosis - not on ASA as she is on Plavix from stroke perspective. Remains on atorvastatin - lipids have been managed by outside provider. Patient states she is working on getting a  new PCP in the interim.  3. History of stroke, also h/o paraophthalmic artery aneurysms - needs neuro follow-up for this as she is maintained on Plavix for this. The patient also has h/o paraophthalmic artery aneurysms for which neurology ordered follow-up imaging in 2020 but I do not see the patient both completed this or followed up further. She reports she's seen someone down the road in the last 4 years but I told her the last OV looks to be 2020 buy our system. We gave her the [facility] number and advised she call them to arrange a follow-up appointment. She does not wish to have ILR extracted at this time but will let us know if she decides in the future.  4. Hypokalemia - recheck labs today.  5. Generalized fatigue - get labs today and workup above. Otherwise since this is a nonspecific finding, strongly recommended f/u PCP as well.     Disposition: F/u with me after cMRI as above.   Medication Adjustments/Labs and Tests Ordered: Current medicines are reviewed at length with the patient today.  Concerns regarding medicines are outlined above. Medication changes, Labs and Tests ordered today are summarized above and listed in the Patient Instructions accessible in Encounters.   Signed, Laurann Montana, PA-C  07/18/2022 10:54 AM    [facility] Phone: [phone]; Fax: [phone]

## 2022-07-18 ENCOUNTER — Encounter: Payer: Self-pay | Admitting: Physician Assistant

## 2022-07-18 ENCOUNTER — Ambulatory Visit: Payer: Medicare Other | Attending: Nurse Practitioner | Admitting: Physician Assistant

## 2022-07-18 VITALS — BP 118/72 | HR 63 | Ht 67.0 in | Wt 154.0 lb

## 2022-07-18 DIAGNOSIS — I700 Atherosclerosis of aorta: Secondary | ICD-10-CM

## 2022-07-18 DIAGNOSIS — I5022 Chronic systolic (congestive) heart failure: Secondary | ICD-10-CM | POA: Diagnosis not present

## 2022-07-18 DIAGNOSIS — R5383 Other fatigue: Secondary | ICD-10-CM

## 2022-07-18 DIAGNOSIS — I2510 Atherosclerotic heart disease of native coronary artery without angina pectoris: Secondary | ICD-10-CM

## 2022-07-18 DIAGNOSIS — E876 Hypokalemia: Secondary | ICD-10-CM

## 2022-07-18 DIAGNOSIS — I428 Other cardiomyopathies: Secondary | ICD-10-CM

## 2022-07-18 DIAGNOSIS — Z8673 Personal history of transient ischemic attack (TIA), and cerebral infarction without residual deficits: Secondary | ICD-10-CM

## 2022-07-18 DIAGNOSIS — I671 Cerebral aneurysm, nonruptured: Secondary | ICD-10-CM

## 2022-07-18 MED ORDER — POTASSIUM CHLORIDE CRYS ER 10 MEQ PO TBCR: 10.0000 meq | EXTENDED_RELEASE_TABLET | Freq: Two times a day (BID) | ORAL | 3 refills | Status: DC

## 2022-07-18 NOTE — Patient Instructions (Addendum)
Medication Instructions:  Your physician recommends that you continue on your current medications as directed. Please refer to the Current Medication list given to you today. *If you need a refill on your cardiac medications before your next appointment, please call your pharmacy*   Lab Work: TODAY-BMET, MAG, CBC, TSH If you have labs (blood work) drawn today and your tests are completely normal, you will receive your results only by: [place] (if you have MyChart) OR A paper copy in the mail If you have any lab test that is abnormal or we need to change your treatment, we will call you to review the results.   Testing/Procedures: CARDIAC MRI INSTRUCTIONS BELOW   Follow-Up: At [facility], you and your health needs are our priority.  As part of our continuing mission to provide you with exceptional heart care, we have created designated Provider Care Teams.  These Care Teams include your primary Cardiologist (physician) and Advanced Practice Providers (APPs -  Physician Assistants and Nurse Practitioners) who all work together to provide you with the care you need, when you need it.  We recommend signing up for the patient portal called "MyChart".  Sign up information is provided on this After Visit Summary.  MyChart is used to connect with patients for Virtual Visits (Telemedicine).  Patients are able to view lab/test results, encounter notes, upcoming appointments, etc.  Non-urgent messages can be sent to your provider as well.   To learn more about what you can do with MyChart, go to [email or web address].    Your next appointment:   AFTER CARDIAC MRI  The format for your next appointment:   In Person  Provider:   Melina Copa, PA-C       Other Instructions The phone number to [facility] is [phone]   CARDIAC MRI INSTRUCTIONS     You are scheduled for Cardiac MRI on _______________________. Please arrive for your appointment at  ______________ ( arrive 30-45 minutes prior to test start time). ?  [facility] [address] [place], [place] [address] [phone] Please take advantage of the free valet parking available at the MAIN entrance (A entrance).  Proceed to the [facility] Radiology Department (First Floor) for check-in.    Magnetic resonance imaging (MRI) is a painless test that produces images of the inside of the body without using Xrays.  During an MRI, strong magnets and radio waves work together in a Research officer, political party to form detailed images.   MRI images may provide more details about a medical condition than X-rays, CT scans, and ultrasounds can provide.  You may be given earphones to listen for instructions.  You may eat a light breakfast and take medications as ordered with the exception of HCTZ (fluid pill, other). Please avoid stimulants for 12 hr prior to test. (Ie. Caffeine, nicotine, chocolate, or antihistamine medications)  If a contrast material will be used, an IV will be inserted into one of your veins. Contrast material will be injected into your IV. It will leave your body through your urine within a day. You may be told to drink plenty of fluids to help flush the contrast material out of your system.  You will be asked to remove all metal, including: Watch, jewelry, and other metal objects including hearing aids, hair pieces and dentures. Also wearable glucose monitoring systems (ie. Freestyle Libre and Omnipods) (Braces and fillings normally are not a problem.)   TEST WILL TAKE APPROXIMATELY 1 HOUR  PLEASE NOTIFY SCHEDULING  AT LEAST 24 HOURS IN ADVANCE IF YOU ARE UNABLE TO KEEP YOUR APPOINTMENT. [phone]  Please call Rockwell Alexandria, cardiac imaging nurse navigator with any questions/concerns. Rockwell Alexandria RN Navigator Cardiac Imaging Larey Brick RN Navigator Cardiac Imaging [facility] [phone] Office    Important Information  About Sugar

## 2022-07-19 LAB — CBC
Hematocrit: 39.2 % (ref 34.0–46.6)
Hemoglobin: 13.4 g/dL (ref 11.1–15.9)
MCH: 34 pg — ABNORMAL HIGH (ref 26.6–33.0)
MCHC: 34.2 g/dL (ref 31.5–35.7)
MCV: 100 fL — ABNORMAL HIGH (ref 79–97)
Platelets: 274 10*3/uL (ref 150–450)
RBC: 3.94 x10E6/uL (ref 3.77–5.28)
RDW: 11.6 % — ABNORMAL LOW (ref 11.7–15.4)
WBC: 4.8 10*3/uL (ref 3.4–10.8)

## 2022-07-19 LAB — BASIC METABOLIC PANEL
BUN/Creatinine Ratio: 18 (ref 12–28)
BUN: 16 mg/dL (ref 8–27)
CO2: 28 mmol/L (ref 20–29)
Calcium: 10.6 mg/dL — ABNORMAL HIGH (ref 8.7–10.3)
Chloride: 101 mmol/L (ref 96–106)
Creatinine, Ser: 0.9 mg/dL (ref 0.57–1.00)
Glucose: 87 mg/dL (ref 70–99)
Potassium: 4.9 mmol/L (ref 3.5–5.2)
Sodium: 144 mmol/L (ref 134–144)
eGFR: 68 mL/min/{1.73_m2} (ref >59)

## 2022-07-19 LAB — TSH: TSH: 1.28 u[IU]/mL (ref 0.450–4.500)

## 2022-07-19 LAB — MAGNESIUM: Magnesium: 2.2 mg/dL (ref 1.6–2.3)

## 2022-08-03 ENCOUNTER — Other Ambulatory Visit: Payer: Self-pay | Admitting: Cardiology

## 2022-08-31 NOTE — Progress Notes (Unsigned)
Cardiology Office Note    Date:  09/01/2022   ID:  Elizabeth Bowman, DOB 06-08-1951, MRN [id number]  PCP:  Concepcion Elk, MD  Cardiologist:  Candee Furbish, MD  Electrophysiologist:  None   Chief Complaint: f/u fatigue   History of Present Illness:   Elizabeth Bowman is a 71 y.o. 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   She was initially seen by cardiology during 2018 admission for stroke and found to have severe LV dysfunction. CT angio head/neck possible acute left temporal infarct, severe stenosis of the L M2 segment, bilateral paraophthalmic wide necked 4 mm aneurysms, moderate stenosis of right cavernous internal carotid artery; MR brain showed small acute L MCA infarct. She was outside of the window for TPA. Echo showed EF 30-35%. TEE without source of emboli. She underwent loop recorder and eventual heart cath 08/2017 that showed minor nonobstructive CAD, 20% ost-dist LAD, 10% Cx, 10% OM1. ILR stopped transmitting in 2021 but had not had any evidence of atrial fib. She could not previously afford Entresto. Neuro had ordered repeat head imaging in 2020 to f/u her paraophthalmic artery aneurysms but does not look like she ever had this done. She is on Plavix at direction of neuro due to stroke, was advised to contact neurology for follow-up at last visit. Last echo 12/2019 showed EF 40%, G1DD, aortic sclerosis without stenosis -> was not felt to require ICD since EF >35%. She has previously declined ILR removal.  Lipids had been managed by outside provider.   She was seen back in the office 07/2022 with increasing fatigue the last few weeks. EKG appeared more abnormal than prior, reviewed with Dr. Marlou Porch, not felt to reflect ACS or pericarditis. cMRI was ordered which is pending at this time. I had confirmed with  EP APP that MRI could be done with ILR still in place. At that OV, bloodwork showed mild hypercalcemia as well as high-normal potassium though labs looked potentially c/w lab error. I recommended repeat labs to help guide medication management as we were considering spironolactone, but she did not return to have this done here. She was also advised to f/u PCP for generalized fatigue. She returns for follow-up today. The cMRI is still pending but is scheduled. She did establish with a new PCP on 08/30/22 who did bloodwork with results below - K 3.7, calcium normal, but new AKI with Cr 1.38. Interestingly her LDL was 222 when it was previously 70 in 05/2021 - in retrospect she reports she was not taking her atorvastatin recently as she had run out. Her PCP switched her to rosuvastatin. She denies any other recent med changes or lapses in compliance/restarting and reports had been taking Lasix/lisinopril consistently without acute change recently. She states they did not make any changes and planned to recheck labs in a week or two. Interestingly she reports her fatigue has been better the last few days.  Labwork independently reviewed: 08/30/22 [facility] trig 222, LDL 212, A1c 6.2, K 3.7, BUN/Cr 1.38, LFTs ok 07/2022 TSH wnl, Mg 2.2, Hgb 13.4, plt wnl 05/2022 LFTs ok 05/2021 LDL 70, trig 81 (outside provider), A1c 5.4      Cardiology Studies:   Studies reviewed are outlined and summarized above. Reports included below if pertinent.   2d echo 12/2019   1. Left ventricular ejection fraction, by  estimation, is 40%. The left  ventricle has mildly decreased function. The left ventricle demonstrates  global hypokinesis. The left ventricular internal cavity size was severely  dilated. Left ventricular  diastolic parameters are consistent with Grade I diastolic dysfunction  (impaired relaxation). Elevated left ventricular end-diastolic pressure.  The average left ventricular global longitudinal strain is -15.7  %.   2. Right ventricular systolic function is normal. The right ventricular  size is normal.   3. The mitral valve is normal in structure. Trivial mitral valve  regurgitation. No evidence of mitral stenosis.   4. The aortic valve is tricuspid. Aortic valve regurgitation is not  visualized. Mild to moderate aortic valve sclerosis/calcification is  present, without any evidence of aortic stenosis.   5. The inferior vena cava is normal in size with greater than 50%  respiratory variability, suggesting right atrial pressure of 3 mmHg.   Comparison(s): Left ventricle: The cavity size was mildly dilated. There  was mild concentric hypertrophy. Systolic function was moderately to  severely reduced. The estimated ejection fraction was in the range of 30%  to 35%. Diffuse hypokinesis.    Carotid US 05/2018  Final Interpretation:  Right Carotid: Velocities in the right ICA are consistent with a 1-39%  stenosis.   Left Carotid: Velocities in the left ICA are consistent with a 1-39%  stenosis.   Vertebrals: Bilateral vertebral arteries demonstrate antegrade flow.   *See table(s) above for measurements and observations.       Electronically signed by Antony Contras MD on 06/20/2018 at 4:36:07 PM.       Cath 2018 Ost LAD to Dist LAD lesion is 20% stenosed. Ost Cx to Prox Cx lesion is 10% stenosed with 10% stenosed side branch in Ost 1st Mrg. There is severe left ventricular systolic dysfunction. LV end diastolic pressure is normal. The left ventricular ejection fraction is 25-35% by visual estimate.   1. Minor nonobstructive CAD 2. Severe LV dysfunction 3. Normal LVEDP   Plan: medical management.     TEE 05/2017 Study Conclusions   - Left ventricle: The cavity size was mildly dilated. There was    mild concentric hypertrophy. Systolic function was moderately to    severely reduced. The estimated ejection fraction was in the    range of 30% to 35%. Diffuse hypokinesis.  - Aortic  valve: No evidence of vegetation.  - Mitral valve: There was mild regurgitation.  - Left atrium: No evidence of thrombus in the atrial cavity or    appendage. No evidence of thrombus in the atrial cavity or    appendage. No evidence of thrombus in the appendage.  - Right atrium: The atrium was dilated. No evidence of thrombus in    the atrial cavity or appendage.  - Atrial septum: No defect or patent foramen ovale was identified.    Echo contrast study showed no right-to-left atrial level shunt,    following an increase in RA pressure induced by provocative    maneuvers.  - Pulmonic valve: No evidence of vegetation.   Impressions:   - No cardiac source of emboli was indentified.        Past Medical History:  Diagnosis Date   Aortic atherosclerosis ([place]) 07/20/2018   Noted on CT in 2018   Brain aneurysm    Chronic HFrEF (heart failure with reduced ejection fraction) ([place])    Complication of anesthesia    Diabetes mellitus ([place])    Former tobacco use    HTN (hypertension)  Hyperlipidemia    Mild CAD    NICM (nonischemic cardiomyopathy) ([place])    Stroke [facility])     Past Surgical History:  Procedure Laterality Date   LEFT HEART CATH AND CORONARY ANGIOGRAPHY N/A 09/08/2017   Procedure: LEFT HEART CATH AND CORONARY ANGIOGRAPHY;  Surgeon: Martinique, Peter M, MD;  Location: [place] CV LAB;  Service: Cardiovascular;  Laterality: N/A;   LOOP RECORDER INSERTION N/A 06/21/2017   Procedure: LOOP RECORDER INSERTION;  Surgeon: Evans Lance, MD;  Location: [place] [facility];  Service: Cardiovascular;  Laterality: N/A;   TEE WITHOUT CARDIOVERSION N/A 06/21/2017   Procedure: TRANSESOPHAGEAL ECHOCARDIOGRAM (TEE);  Surgeon: Dorothy Spark, MD;  Location[facility] ENDOSCOPY;  Service: Cardiovascular;  Laterality: N/A;    Current Medications: Current Meds  Medication Sig   anastrozole (ARIMIDEX) 1 MG tablet Take 1 mg by mouth daily.   benzonatate (TESSALON) 100 MG capsule Take 1 capsule  (100 mg total) by mouth 3 (three) times daily as needed for cough.   carvedilol (COREG) 6.25 MG tablet Take 1 tablet (6.25 mg total) by mouth 2 (two) times daily with a meal.   clopidogrel (PLAVIX) 75 MG tablet Take 1 tablet by mouth once daily   furosemide (LASIX) 40 MG tablet Take 1 tablet by mouth once daily   lisinopril (ZESTRIL) 20 MG tablet TAKE 1 TABLET BY MOUTH ONCE DAILY   meclizine (ANTIVERT) 25 MG tablet Take 1 tablet (25 mg total) by mouth 3 (three) times daily as needed for dizziness.   metFORMIN (GLUCOPHAGE) 500 MG tablet TAKE 1 TABLET BY MOUTH ONCE DAILY WITH BREAKFAST   pantoprazole (PROTONIX) 40 MG tablet Take 40 mg by mouth daily as needed (indigestion).   potassium chloride (KLOR-CON M) 10 MEQ tablet Take 1 tablet (10 mEq total) by mouth 2 (two) times daily.   rosuvastatin (CRESTOR) 40 MG tablet Take 40 mg by mouth daily.   Vitamin D, Ergocalciferol, (DRISDOL) 1.25 MG (50000 UT) CAPS capsule Take 50,000 Units by mouth every 7 (seven) days.      Allergies:   Patient has no known allergies.   Social History   Socioeconomic History   Marital status: Married    Spouse name: Not on file   Number of children: Not on file   Years of education: Not on file   Highest education level: Not on file  Occupational History   Not on file  Tobacco Use   Smoking status: Former    Types: Cigarettes    Quit date: 06/19/2007    Years since quitting: 15.2   Smokeless tobacco: Current   Tobacco comments:    Smoked for 20-30 years  Vaping Use   Vaping Use: Never used  Substance and Sexual Activity   Alcohol use: Yes    Comment: occ   Drug use: No   Sexual activity: Yes    Partners: Female  Other Topics Concern   Not on file  Social History Narrative   Not on file   Social Determinants of Health   Financial Resource Strain: Not on file  Food Insecurity: Not on file  Transportation Needs: Not on file  Physical Activity: Not on file  Stress: Not on file  Social  Connections: Not on file     Family History:  The patient's family history includes Cancer in her brother and father. There is no history of CAD, Heart attack, or Congestive Heart Failure.  ROS:   Please see the history of present illness.  All other  systems are reviewed and otherwise negative.    EKG(s)/Additional Labs   EKG:  EKG is not ordered today  Recent Labs: 10/19/2021: ALT 15 07/18/2022: BUN 16; Creatinine, Ser 0.90; Hemoglobin 13.4; Magnesium 2.2; Platelets 274; Potassium 4.9; Sodium 144; TSH 1.280  Recent Lipid Panel    Component Value Date/Time   CHOL 152 10/07/2019 1358   TRIG 97 10/07/2019 1358   HDL 51 10/07/2019 1358   CHOLHDL 3.0 10/07/2019 1358   CHOLHDL 3.8 06/19/2017 0310   VLDL 16 06/19/2017 0310   LDLCALC 83 10/07/2019 1358    PHYSICAL EXAM:    VS:  BP 120/60 (BP Location: Left Arm, Patient Position: Sitting, Cuff Size: Normal)   Pulse 75   Ht 5\' 7"  (1.702 m)   Wt 156 lb (70.8 kg)   SpO2 96%   BMI 24.43 kg/m   BMI: Body mass index is 24.43 kg/m.  GEN: Well nourished, well developed female in no acute distress HEENT: normocephalic, atraumatic Neck: no JVD, carotid bruits, or masses Cardiac: RRR; no murmurs, rubs, or gallops, no edema  Respiratory:  clear to auscultation bilaterally, normal work of breathing GI: soft, nontender, nondistended, + BS MS: no deformity or atrophy Skin: warm and dry, no rash Neuro:  Alert and Oriented x 3, Strength and sensation are intact, follows commands Psych: euthymic mood, full affect  Wt Readings from Last 3 Encounters:  09/01/22 156 lb (70.8 kg)  07/18/22 154 lb (69.9 kg)  12/03/21 163 lb (73.9 kg)     ASSESSMENT & PLAN:   1. Chronic HFrEF/NICM - appears euvolemic on exam. Labwork done 08/30/22 demonstrates new AKI for unclear reasons. We had not yet made any med changes at the last visit as we were pending repeat values. She appears euvolemic on exam and therefore we will decrease Lasix to 1/2 tablet  daily but keep potassium the same given K 3.7. She indicates her PCP plans to recheck bloodwork in the next week or two - I recommended she contact them to verify the plan and recommend recheck BMET in 1 week. Happy to order in our office if there is any delay in getting this done. In the meantime we will table our plan to consider adding spironolactone / SGLT2i until renal function is confirmed to be stable. Otherwise continue carvedilol and lisinopril. Await cMRI.  3. Recent lab abnormalities with prior hypokalemia, potential hypercalcemia, and now AKI - in our office her K was 4.9 with mildly elevated calcium which was incongruent from prior labs, suggestive of lab error. Repeat values from PCP 08/30/22 reviewed as above. See med plan in #1.  2. Mild CAD/aortic atherosclerosis - not on ASA as she is on Plavix from stroke perspective. Her lipid panel 08/30/22 was quite deranged but in retrospect she reports she had run out of atorvastatin but had not realized it. She is now on rosuvastatin, started by Dr. Valora Piccolo - lipids being followed in primary care.  4. History of stroke, also h/o paraopthalmic aneurysms - she has been maintained on Plavix for this. Discussed in detail at last visit and recommended she follow-up with [facility] for follow-up/surveillance.      Disposition: F/u with me after cMRI.   Medication Adjustments/Labs and Tests Ordered: Current medicines are reviewed at length with the patient today.  Concerns regarding medicines are outlined above. Medication changes, Labs and Tests ordered today are summarized above and listed in the Patient Instructions accessible in Encounters.   Signed, Elizabeth Pitter, PA-C  09/01/2022 10:50  AM    [place] Phone: [phone]; Fax: [phone]

## 2022-09-01 ENCOUNTER — Ambulatory Visit: Payer: Medicare Other | Attending: Physician Assistant | Admitting: Physician Assistant

## 2022-09-01 ENCOUNTER — Encounter: Payer: Self-pay | Admitting: Physician Assistant

## 2022-09-01 VITALS — BP 120/60 | HR 75 | Ht 67.0 in | Wt 156.0 lb

## 2022-09-01 DIAGNOSIS — I5022 Chronic systolic (congestive) heart failure: Secondary | ICD-10-CM

## 2022-09-01 DIAGNOSIS — I700 Atherosclerosis of aorta: Secondary | ICD-10-CM

## 2022-09-01 DIAGNOSIS — E876 Hypokalemia: Secondary | ICD-10-CM

## 2022-09-01 DIAGNOSIS — I428 Other cardiomyopathies: Secondary | ICD-10-CM

## 2022-09-01 DIAGNOSIS — N179 Acute kidney failure, unspecified: Secondary | ICD-10-CM

## 2022-09-01 DIAGNOSIS — I2510 Atherosclerotic heart disease of native coronary artery without angina pectoris: Secondary | ICD-10-CM

## 2022-09-01 DIAGNOSIS — Z8673 Personal history of transient ischemic attack (TIA), and cerebral infarction without residual deficits: Secondary | ICD-10-CM

## 2022-09-01 MED ORDER — FUROSEMIDE 40 MG PO TABS: 20.0000 mg | ORAL_TABLET | Freq: Every day | ORAL | 0 refills | Status: DC

## 2022-09-01 NOTE — Patient Instructions (Addendum)
Medication Instructions:  DECREASE Lasix to half tablet daily *If you need a refill on your cardiac medications before your next appointment, please call your pharmacy*   Lab Work: None Ordered   Testing/Procedures: None ordered   Follow-Up: At [facility], you and your health needs are our priority.  As part of our continuing mission to provide you with exceptional heart care, we have created designated Provider Care Teams.  These Care Teams include your primary Cardiologist (physician) and Advanced Practice Providers (APPs -  Physician Assistants and Nurse Practitioners) who all work together to provide you with the care you need, when you need it.  We recommend signing up for the patient portal called "MyChart".  Sign up information is provided on this After Visit Summary.  MyChart is used to connect with patients for Virtual Visits (Telemedicine).  Patients are able to view lab/test results, encounter notes, upcoming appointments, etc.  Non-urgent messages can be sent to your provider as well.   To learn more about what you can do with MyChart, go to [email or web address].    Your next appointment:   Schedule appt after (10/06/22)  The format for your next appointment:   In Person  Provider:   Melina Copa, PA-C        Other Instructions  Please call your primary care doctor's office and ask them when they are planning on repeating bloodwork for your kidney function. We would recommend it be done within 1 week if they were planning later than that. Please ask them to fax [place] a copy at [phone], attention Dayna Dunn PA-C.  Important Information About Sugar

## 2022-09-05 ENCOUNTER — Telehealth: Payer: Self-pay | Admitting: Physician Assistant

## 2022-09-05 NOTE — Telephone Encounter (Signed)
Labs received from PCP 08/30/22 K 3.7, Cr 1.38, LFTs OK, TChol 308, trig 222, LDL 212, A1C 6.2, calcium wnl 10.0. As previously outlined, table spironolactone/SGLT2i for now given recent AKI. Await cardiac MRI before guiding next steps. LDL up but recently started rosuvastatin.

## 2022-09-14 ENCOUNTER — Encounter: Payer: Self-pay | Admitting: Physician Assistant

## 2022-09-14 NOTE — Progress Notes (Signed)
Labs from PCP 09/07/22 with NA 145, K 4.7, Cr 1.11. Given recent AKI (though improving), last BP, will await cMRI before adjusting any meds further. We'll hold off on spironolactone with high normal K.

## 2022-10-03 LAB — COLOGUARD: COLOGUARD: POSITIVE — AB

## 2022-10-05 ENCOUNTER — Telehealth (HOSPITAL_COMMUNITY): Payer: Self-pay | Admitting: *Deleted

## 2022-10-05 NOTE — Telephone Encounter (Signed)
Reaching out to patient to offer assistance regarding upcoming cardiac imaging study; pt verbalizes understanding of appt date/time, parking situation and where to check in,  and verified current allergies; name and call back number provided for further questions should they arise  Larey Brick RN Navigator Cardiac Imaging [facility] [phone] office [phone] cell  Patient reports having an MRI a year ago without incident. Pt has a loop recorder.

## 2022-10-06 ENCOUNTER — Ambulatory Visit (HOSPITAL_COMMUNITY)
Admission: RE | Admit: 2022-10-06 | Discharge: 2022-10-06 | Disposition: A | Discharge status: Home / Self Care | Payer: Medicare Other | Source: Ambulatory Visit | Attending: Physician Assistant | Admitting: Physician Assistant

## 2022-10-06 ENCOUNTER — Other Ambulatory Visit: Payer: Self-pay | Admitting: Physician Assistant

## 2022-10-06 DIAGNOSIS — I428 Other cardiomyopathies: Secondary | ICD-10-CM

## 2022-10-06 DIAGNOSIS — R5383 Other fatigue: Secondary | ICD-10-CM

## 2022-10-06 DIAGNOSIS — E876 Hypokalemia: Secondary | ICD-10-CM

## 2022-10-06 DIAGNOSIS — I2510 Atherosclerotic heart disease of native coronary artery without angina pectoris: Secondary | ICD-10-CM | POA: Diagnosis not present

## 2022-10-06 DIAGNOSIS — I5022 Chronic systolic (congestive) heart failure: Secondary | ICD-10-CM | POA: Diagnosis not present

## 2022-10-06 DIAGNOSIS — Z8673 Personal history of transient ischemic attack (TIA), and cerebral infarction without residual deficits: Secondary | ICD-10-CM

## 2022-10-06 DIAGNOSIS — I700 Atherosclerosis of aorta: Secondary | ICD-10-CM

## 2022-10-06 DIAGNOSIS — I671 Cerebral aneurysm, nonruptured: Secondary | ICD-10-CM | POA: Diagnosis present

## 2022-10-06 MED ORDER — GADOBUTROL 1 MMOL/ML IV SOLN
10.0000 mL | Freq: Once | INTRAVENOUS | Status: AC | PRN
  Administered 2022-10-06: 10 mL via INTRAVENOUS

## 2022-11-06 ENCOUNTER — Encounter: Payer: Self-pay | Admitting: Physician Assistant

## 2022-11-06 NOTE — Progress Notes (Addendum)
Cardiology Office Note    Date:  11/07/2022   ID:  Elizabeth Bowman, DOB 1950-12-15, MRN [id number]  PCP:  Malka So., MD  Cardiologist:  Donato Schultz, MD  Electrophysiologist:  None   Chief Complaint: discuss cardiac MRI  History of Present Illness:   Elizabeth Bowman is a 72 y.o. 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   She was initially seen by cardiology during 2018 admission for stroke and found to have severe LV dysfunction. CT angio head/neck possible acute left temporal infarct, severe stenosis of the L M2 segment, bilateral paraophthalmic wide necked 4 mm aneurysms, moderate stenosis of right cavernous internal carotid artery; MR brain showed small acute L MCA infarct. She was outside of the window for TPA. Echo showed EF 30-35%. TEE without source of emboli. She underwent loop recorder and eventual heart cath 08/2017 that showed minor nonobstructive CAD, 20% ost-dist LAD, 10% Cx, 10% OM1. Repeat carotid 2019 showed 1-39% BICA stenosis. ILR stopped transmitting in 2021 but had not had any evidence of atrial fib. She could not previously afford Entresto. Neuro had ordered repeat head imaging in 2020 to f/u her paraophthalmic artery aneurysms but does not look like she ever had this done. She is on Plavix at direction of neuro due to stroke. She was advised to contact neurology for follow-up at last visit. Last echo 12/2019 showed EF 40%, G1DD, aortic sclerosis without stenosis -> was not felt to require ICD since EF >35%. She has previously declined ILR removal.  Lipids have been managed by primary care.    She was seen back in the office 07/2022 with increasing fatigue for several weeks. EKG appeared more abnormal than prior, reviewed  with Dr. Anne Fu, not felt to reflect ACS or pericarditis. cMRI was ordered to exclude infiltrative cardiomyopathy (had confirmed with EP APP that MRI could be done with ILR still in place). At that OV, bloodwork showed mild hypercalcemia as well as high-normal potassium though labs looked potentially c/w lab error. I recommended repeat labs to help guide medication management as we were considering spironolactone, but she did not return to have this done here. She did establish with a new PCP on 08/30/22 who did bloodwork with K 3.7, calcium normal, but new AKI with Cr 1.38 for unclear reasons so we decided not to add spironolactone. Interestingly her LDL was 222 when it was previously 70 in 05/2021 - in retrospect she reported she was not taking her atorvastatin recently as she had run out. Her PCP switched her to rosuvastatin. cMRI was performed 10/06/22 with mild LVE, LVH with global HK EF 36%, delayed gadolinium with no uptake, no evidence of infiltration, scar or infarct, + mild LAE, lipomatous hypertrophy of atrial septum, normal RVEF.  She is seen back for follow-up doing well. She continues to have mild fatigue but not at the level which prompted care in 07/2022. She denies any CP, SOB, orthopnea, PND, lower extremity edema. She does report she's been accidentally taking both the previous rx for atorvastatin and rosuvastatin for the last month. She has f/u coming up end of this month with PCP. Blood pressure is mildly elevated today but she reports increased stress over the loss of her granddaughter recently from cancer. She reports otherwise BP  generally controlled at home, nadir 110. She also had a positive cologuard in November and will be pending colonoscopy soon.    Labwork independently reviewed: 09/07/22 Scanned labs Na 145, K 4.7, Cr 1.11, Ca 10.2 08/30/22 Labs from PCP K 3.7, Cr 1.38, LFTs OK, TChol 308, trig 222, LDL 212, A1C 6.2, calcium wnl 10.0  07/2022 TSH wnl, Mg 2.2, Hgb 13.4, plt  wnl 05/2021 LDL 70, trig 81 (outside provider), A1c 5.4   Past History    Past Medical History:  Diagnosis Date   Aortic atherosclerosis (HCC) 07/20/2018   Noted on CT in 2018   Brain aneurysm    Chronic HFrEF (heart failure with reduced ejection fraction) (HCC)    Complication of anesthesia    Diabetes mellitus (HCC)    Former tobacco use    HTN (hypertension)    Hyperlipidemia    Mild CAD    Mild carotid artery disease (HCC)    Mild renal insufficiency    NICM (nonischemic cardiomyopathy) (HCC)    Stroke [facility])     Past Surgical History:  Procedure Laterality Date   LEFT HEART CATH AND CORONARY ANGIOGRAPHY N/A 09/08/2017   Procedure: LEFT HEART CATH AND CORONARY ANGIOGRAPHY;  Surgeon: [place], Peter M, MD;  Location: [facility] INVASIVE CV LAB;  Service: Cardiovascular;  Laterality: N/A;   LOOP RECORDER INSERTION N/A 06/21/2017   Procedure: LOOP RECORDER INSERTION;  Surgeon: Marinus Maw, MD;  Location: [facility] INVASIVE CV LAB;  Service: Cardiovascular;  Laterality: N/A;   TEE WITHOUT CARDIOVERSION N/A 06/21/2017   Procedure: TRANSESOPHAGEAL ECHOCARDIOGRAM (TEE);  Surgeon: Lars Masson, MD;  Location: [facility] ENDOSCOPY;  Service: Cardiovascular;  Laterality: N/A;    Current Medications: Current Meds  Medication Sig   carvedilol (COREG) 6.25 MG tablet Take 1 tablet (6.25 mg total) by mouth 2 (two) times daily with a meal.   clopidogrel (PLAVIX) 75 MG tablet Take 1 tablet by mouth once daily   furosemide (LASIX) 40 MG tablet Take 0.5 tablets (20 mg total) by mouth daily.   lisinopril (ZESTRIL) 20 MG tablet TAKE 1 TABLET BY MOUTH ONCE DAILY   meclizine (ANTIVERT) 25 MG tablet Take 1 tablet (25 mg total) by mouth 3 (three) times daily as needed for dizziness.   metFORMIN (GLUCOPHAGE) 500 MG tablet TAKE 1 TABLET BY MOUTH ONCE DAILY WITH BREAKFAST   pantoprazole (PROTONIX) 40 MG tablet Take 40 mg by mouth daily as needed (indigestion).   potassium chloride (KLOR-CON M) 10 MEQ tablet Take 1  tablet (10 mEq total) by mouth 2 (two) times daily.   rosuvastatin (CRESTOR) 40 MG tablet Take 40 mg by mouth daily.   Vitamin D, Ergocalciferol, (DRISDOL) 1.25 MG (50000 UT) CAPS capsule Take 50,000 Units by mouth every 7 (seven) days.       Allergies:   Patient has no known allergies.   Social History   Socioeconomic History   Marital status: Married    Spouse name: Not on file   Number of children: Not on file   Years of education: Not on file   Highest education level: Not on file  Occupational History   Not on file  Tobacco Use   Smoking status: Former    Types: Cigarettes    Quit date: 06/19/2007    Years since quitting: 15.3   Smokeless tobacco: Current   Tobacco comments:    Smoked for 20-30 years  Vaping Use   Vaping Use: Never used  Substance and Sexual Activity   Alcohol use:  Yes    Comment: occ   Drug use: No   Sexual activity: Yes    Partners: Female  Other Topics Concern   Not on file  Social History Narrative   Not on file   Social Determinants of Health   Financial Resource Strain: Not on file  Food Insecurity: Not on file  Transportation Needs: Not on file  Physical Activity: Not on file  Stress: Not on file  Social Connections: Not on file     Family History:  The patient's family history includes Cancer in her brother and father. There is no history of CAD, Heart attack, or Congestive Heart Failure.  ROS:   Please see the history of present illness. All other systems are reviewed and otherwise negative.    EKG(s)/Additional Testing   EKG:  EKG is not ordered today but reviewed from 07/2022  CV Studies: Cardiac studies reviewed are outlined and summarized above. Otherwise please see EMR for full report.  Recent Labs: 07/18/2022: BUN 16; Creatinine, Ser 0.90; Hemoglobin 13.4; Magnesium 2.2; Platelets 274; Potassium 4.9; Sodium 144; TSH 1.280  Recent Lipid Panel    Component Value Date/Time   CHOL 152 10/07/2019 1358   TRIG 97  10/07/2019 1358   HDL 51 10/07/2019 1358   CHOLHDL 3.0 10/07/2019 1358   CHOLHDL 3.8 06/19/2017 0310   VLDL 16 06/19/2017 0310   LDLCALC 83 10/07/2019 1358    PHYSICAL EXAM:    VS:  BP 138/60   Pulse 71   Ht 5\' 7"  (1.702 m)   Wt 171 lb 9.6 oz (77.8 kg)   SpO2 99%   BMI 26.88 kg/m   BMI: Body mass index is 26.88 kg/m.  GEN: Well nourished, well developed female in no acute distress HEENT: normocephalic, atraumatic Neck: no JVD, carotid bruits, or masses Cardiac: RRR; no murmurs, rubs, or gallops, no edema  Respiratory:  clear to auscultation bilaterally, normal work of breathing GI: soft, nontender, nondistended, + BS MS: no deformity or atrophy Skin: warm and dry, no rash Neuro:  Alert and Oriented x 3, Strength and sensation are intact, follows commands Psych: euthymic mood, full affect  Wt Readings from Last 3 Encounters:  11/07/22 171 lb 9.6 oz (77.8 kg)  09/01/22 156 lb (70.8 kg)  07/18/22 154 lb (69.9 kg)     ASSESSMENT & PLAN:   1. Chronic HFrEF/NICM - etiology of cardiomyopathy not entirely clear, infiltrative disease ruled out by recent cardiac MRI. She had mild CAD in the past which could not explain the LV dysfunction. Although weight is up today, this may be body weight gain after the holiday as she denies any new cardiac symptoms and appears completely euvolemic. She is not on Entresto due to inability to afford this. She is also having issues with her cardiac MRI bill as it may not have been prior auth'd correctly therefore we will ask billing to assist. We discussed adjusting her medication further since SBP is mildly elevated but she reports this has otherwise been controlled at home. She does report some fluctuation when under increased stress due to loss of granddaughter with cancer. Per shared decision making we will continue present regimen otherwise with carvedilol, furosemide 20mg  daily, and lisinopril 20mg  daily, with repeat BMET today. If AKI is still  present, may need to consider trial of stopping Lasix + KCl and see how she does. We will hold off on adding spironolactone and SGLT2i for these reasons, but can continue to re-evaluate in follow-up. The patient  was instructed to monitor their blood pressure at home and to call if tending to run higher than 130 systolic. She also reports she is pending colonoscopy due to positive Cologuard. She reports she has f/u with GI and  sees GI coming up soon as well as PCP very soon. From cardiac standpoint, no contraindication to proceeding if we receive clearance - would just ask that Plavix clearance be obtained from neuro/PCP since we otherwise do not have her on this for cardiac reasons.    2. Mild renal insufficiency - recheck renal function today.  3. Mild CAD, aortic atherosclerosis - asymptomatic without increasing angina. Not on ASA due to concomitant Plavix. See #5 regarding statin.  4. History of stroke, also h/o paraopthalmic aneurysms, mild carotid artery disease - this has been managed by neurology but overdue for follow-up. As above, neurology previously ordered f/u of her paraopthalmic aneurysms but she did not complete this. We have again recommended she contact their office for follow-up. She'd like to hold off on carotid duplex pending that appointment as they may get a CTA head/neck that will image her carotids as well (mild disease in 2018). She is on Plavix due to h/o stroke, not cardiac reasons.  5. Overuse of medication - she reports inadvertantly resuming atorvastatin 80mg  when she was switched to rosuvastatin 40mg  by PCP. Discussed with pharmD: we will check liver function and CK today but otherwise no acute intervention felt to be needed. She knows to discontinue atorvastatin and f/u with PCP for further follow-up of her lipids.    Disposition: F/u with Dr. Anne Fu or me in 3-4 months given mild BP elevation.   Medication Adjustments/Labs and Tests Ordered: Current medicines are  reviewed at length with the patient today.  Concerns regarding medicines are outlined above. Medication changes, Labs and Tests ordered today are summarized above and listed in the Patient Instructions accessible in Encounters.   Signed, Laurann Montana, PA-C  11/07/2022 2:13 PM    [facility] Phone: [phone]; Fax: [phone]

## 2022-11-07 ENCOUNTER — Encounter: Payer: Self-pay | Admitting: Physician Assistant

## 2022-11-07 ENCOUNTER — Ambulatory Visit: Payer: Medicare Other | Attending: Physician Assistant | Admitting: Physician Assistant

## 2022-11-07 VITALS — BP 138/60 | HR 71 | Ht 67.0 in | Wt 171.6 lb

## 2022-11-07 DIAGNOSIS — Z91148 Patient's other noncompliance with medication regimen for other reason: Secondary | ICD-10-CM

## 2022-11-07 DIAGNOSIS — N289 Disorder of kidney and ureter, unspecified: Secondary | ICD-10-CM

## 2022-11-07 DIAGNOSIS — I5022 Chronic systolic (congestive) heart failure: Secondary | ICD-10-CM

## 2022-11-07 DIAGNOSIS — I6523 Occlusion and stenosis of bilateral carotid arteries: Secondary | ICD-10-CM

## 2022-11-07 DIAGNOSIS — I2510 Atherosclerotic heart disease of native coronary artery without angina pectoris: Secondary | ICD-10-CM | POA: Diagnosis not present

## 2022-11-07 DIAGNOSIS — I700 Atherosclerosis of aorta: Secondary | ICD-10-CM | POA: Diagnosis not present

## 2022-11-07 DIAGNOSIS — Z8673 Personal history of transient ischemic attack (TIA), and cerebral infarction without residual deficits: Secondary | ICD-10-CM

## 2022-11-07 NOTE — Patient Instructions (Addendum)
Medication Instructions:  Your physician recommends that you continue on your current medications as directed. Please refer to the Current Medication list given to you today.  *If you need a refill on your cardiac medications before your next appointment, please call your pharmacy*   Lab Work: CMET and CK today If you have labs (blood work) drawn today and your tests are completely normal, you will receive your results only by: [place] (if you have MyChart) OR A paper copy in the mail If you have any lab test that is abnormal or we need to change your treatment, we will call you to review the results.   Follow-Up: At [facility], you and your health needs are our priority.  As part of our continuing mission to provide you with exceptional heart care, we have created designated Provider Care Teams.  These Care Teams include your primary Cardiologist (physician) and Advanced Practice Providers (APPs -  Physician Assistants and Nurse Practitioners) who all work together to provide you with the care you need, when you need it.  Your next appointment:   3-4 month(s)  The format for your next appointment:   In Person  Provider:   Candee Furbish, MD  or Melina Copa, PA-C        Important Information About Sugar         Please monitor your blood pressure occasionally at home. Call our office if you tend to get readings of greater than 130 on the top number or 80 on the bottom number.  You are overdue for follow-up with [facility]. Please call their office to schedule a follow-up appointment at [phone].

## 2022-11-08 LAB — CK: Total CK: 59 U/L (ref 32–182)

## 2022-11-08 LAB — COMPREHENSIVE METABOLIC PANEL WITH GFR
ALT: 9 IU/L (ref 0–32)
AST: 15 IU/L (ref 0–40)
Albumin/Globulin Ratio: 2 (ref 1.2–2.2)
Albumin: 4.4 g/dL (ref 3.8–4.8)
Alkaline Phosphatase: 75 IU/L (ref 44–121)
BUN/Creatinine Ratio: 11 — ABNORMAL LOW (ref 12–28)
BUN: 14 mg/dL (ref 8–27)
Bilirubin Total: 0.6 mg/dL (ref 0.0–1.2)
CO2: 25 mmol/L (ref 20–29)
Calcium: 9.6 mg/dL (ref 8.7–10.3)
Chloride: 105 mmol/L (ref 96–106)
Creatinine, Ser: 1.26 mg/dL — ABNORMAL HIGH (ref 0.57–1.00)
Globulin, Total: 2.2 g/dL (ref 1.5–4.5)
Glucose: 81 mg/dL (ref 70–99)
Potassium: 4.2 mmol/L (ref 3.5–5.2)
Sodium: 143 mmol/L (ref 134–144)
Total Protein: 6.6 g/dL (ref 6.0–8.5)
eGFR: 45 mL/min/1.73 — ABNORMAL LOW

## 2022-11-09 ENCOUNTER — Telehealth: Payer: Self-pay

## 2022-11-09 DIAGNOSIS — Z79899 Other long term (current) drug therapy: Secondary | ICD-10-CM

## 2022-11-09 NOTE — Telephone Encounter (Signed)
Reviewed Dunn, PA's recommendations with patient. Patient verbalizes understanding to hold lasix and potassium x 1 week and then complete a BMET. Orders placed and lab work scheduled.

## 2022-11-09 NOTE — Telephone Encounter (Signed)
-----   Message from Charlie Pitter, [place] sent at 11/08/2022 12:20 PM EST ----- Please let pt know labs continue to show mild insufficiency of the kidneys, similar to values seen over the last few months. Let's try stopping Lasix and potassium and rechecking BMET in 1 week. If any symptoms of volume overload come back like swelling or shortness of breath, please let us know. Thank you.

## 2022-11-17 ENCOUNTER — Ambulatory Visit: Payer: Medicare Other | Attending: Cardiology

## 2022-11-17 DIAGNOSIS — Z79899 Other long term (current) drug therapy: Secondary | ICD-10-CM

## 2022-11-18 ENCOUNTER — Telehealth: Payer: Self-pay

## 2022-11-18 LAB — BASIC METABOLIC PANEL
BUN/Creatinine Ratio: 11 — ABNORMAL LOW (ref 12–28)
BUN: 10 mg/dL (ref 8–27)
CO2: 27 mmol/L (ref 20–29)
Calcium: 9.9 mg/dL (ref 8.7–10.3)
Chloride: 105 mmol/L (ref 96–106)
Creatinine, Ser: 0.89 mg/dL (ref 0.57–1.00)
Glucose: 143 mg/dL — ABNORMAL HIGH (ref 70–99)
Potassium: 3.8 mmol/L (ref 3.5–5.2)
Sodium: 144 mmol/L (ref 134–144)
eGFR: 68 mL/min/{1.73_m2} (ref >59)

## 2022-11-18 NOTE — Telephone Encounter (Signed)
Spoke with patient to review lab results with patient. She reports feeling "pretty good". Her BP is running between 120-140/70s. She has stopped the lasix and potassium. No questions or concerns at this time.

## 2022-11-18 NOTE — Telephone Encounter (Signed)
Pt is returning call and is requesting return call.

## 2022-11-18 NOTE — Telephone Encounter (Signed)
Left message for call back.

## 2022-11-21 NOTE — Telephone Encounter (Signed)
Spoke with the patient and advised her to keep up with BP readings for one week. Patient will call back next week with list of readings.

## 2022-11-21 NOTE — Telephone Encounter (Signed)
Thank you! If can please ask her to send [place] a list of BP readings in 1 week, that would be great, ideally 3 hours+ after AM meds.

## 2023-01-23 ENCOUNTER — Other Ambulatory Visit: Payer: Self-pay | Admitting: Cardiology

## 2023-03-15 ENCOUNTER — Encounter: Payer: Self-pay | Admitting: Cardiology

## 2023-03-15 ENCOUNTER — Ambulatory Visit: Payer: Medicare Other | Attending: Cardiology | Admitting: Cardiology

## 2023-03-15 VITALS — BP 178/78 | HR 63 | Ht 67.0 in | Wt 153.0 lb

## 2023-03-15 DIAGNOSIS — E785 Hyperlipidemia, unspecified: Secondary | ICD-10-CM

## 2023-03-15 DIAGNOSIS — I2510 Atherosclerotic heart disease of native coronary artery without angina pectoris: Secondary | ICD-10-CM | POA: Diagnosis not present

## 2023-03-15 DIAGNOSIS — N289 Disorder of kidney and ureter, unspecified: Secondary | ICD-10-CM | POA: Diagnosis not present

## 2023-03-15 DIAGNOSIS — I5022 Chronic systolic (congestive) heart failure: Secondary | ICD-10-CM

## 2023-03-15 NOTE — Patient Instructions (Signed)
Medication Instructions:  The current medical regimen is effective;  continue present plan and medications.  *If you need a refill on your cardiac medications before your next appointment, please call your pharmacy*  Follow-Up: At [facility], you and your health needs are our priority.  As part of our continuing mission to provide you with exceptional heart care, we have created designated Provider Care Teams.  These Care Teams include your primary Cardiologist (physician) and Advanced Practice Providers (APPs -  Physician Assistants and Nurse Practitioners) who all work together to provide you with the care you need, when you need it.  We recommend signing up for the patient portal called "MyChart".  Sign up information is provided on this After Visit Summary.  MyChart is used to connect with patients for Virtual Visits (Telemedicine).  Patients are able to view lab/test results, encounter notes, upcoming appointments, etc.  Non-urgent messages can be sent to your provider as well.   To learn more about what you can do with MyChart, go to [email or web address].    Your next appointment:   6 month(s)  Provider:   Jari Favre, PA-C, Jacolyn Reedy, PA-C, Eligha Bridegroom, NP, or Tereso Newcomer, PA-C

## 2023-03-15 NOTE — Progress Notes (Signed)
Cardiology Office Note:    Date:  03/15/2023   ID:  Elizabeth Bowman, DOB 03/28/1951, MRN [id number]  PCP:  Malka So., MD   [facility] Providers Cardiologist:  Donato Schultz, MD     Referring MD: Malka So., MD    History of Present Illness:    Elizabeth Bowman is a 72 y.o. 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  Back last year in September 2023 she was having increasing fatigue for several weeks.  Cardiac MRI was ordered to exclude infiltrative cardiomyopathy.  Blood work showed mild hypercalcemia high normal potassium.  EF was 36% no infiltrative disease.  Overall main complaint is just being tired.  She likes to rest quite often during the day.  I encouraged activity.  No chest pain.  No syncope.  No bleeding.  Past Medical History:  Diagnosis Date   Aortic atherosclerosis (HCC) 07/20/2018   Noted on CT in 2018   Brain aneurysm    Chronic HFrEF (heart failure with reduced ejection fraction) (HCC)    Complication of anesthesia    Diabetes mellitus (HCC)    Former tobacco use    HTN (hypertension)    Hyperlipidemia    Mild CAD    Mild carotid artery disease (HCC)    Mild renal insufficiency    NICM (nonischemic cardiomyopathy) (HCC)    Stroke [facility])     Past Surgical History:  Procedure Laterality Date   LEFT HEART CATH AND CORONARY ANGIOGRAPHY N/A 09/08/2017   Procedure: LEFT HEART CATH AND CORONARY ANGIOGRAPHY;  Surgeon: Swazil[place], Peter M, MD;  Location: [facility] INVASIVE CV LAB;  Service: Cardiovascular;  Laterality: N/A;   LOOP RECORDER INSERTION N/A 06/21/2017   Procedure: LOOP  RECORDER INSERTION;  Surgeon: Marinus Maw, MD;  Location: [facility] INVASIVE CV LAB;  Service: Cardiovascular;  Laterality: N/A;   TEE WITHOUT CARDIOVERSION N/A 06/21/2017   Procedure: TRANSESOPHAGEAL ECHOCARDIOGRAM (TEE);  Surgeon: Lars Masson, MD;  Location: [facility] ENDOSCOPY;  Service: Cardiovascular;  Laterality: N/A;    Current Medications: Current Meds  Medication Sig   anastrozole (ARIMIDEX) 1 MG tablet Take 1 mg by mouth daily.   benzonatate (TESSALON) 100 MG capsule Take 1 capsule (100 mg total) by mouth 3 (three) times daily as needed for cough.   carvedilol (COREG) 6.25 MG tablet Take 1 tablet (6.25 mg total) by mouth 2 (two) times daily with a meal.   clopidogrel (PLAVIX) 75 MG tablet Take 1 tablet by mouth once daily   lisinopril (ZESTRIL) 20 MG tablet Take 1 tablet by mouth once daily   meclizine (ANTIVERT) 25 MG tablet Take 1 tablet (25 mg total) by mouth 3 (three) times daily as needed for dizziness.   metFORMIN (GLUCOPHAGE) 500 MG tablet TAKE 1 TABLET BY MOUTH ONCE DAILY WITH BREAKFAST   pantoprazole (PROTONIX) 40 MG tablet Take 40 mg by mouth daily as needed (indigestion).   rosuvastatin (CRESTOR) 40 MG tablet Take 40 mg by mouth daily.     Allergies:   Patient has no known allergies.   Social History   Socioeconomic History   Marital status: Married    Spouse name: Not on file   Number of  children: Not on file   Years of education: Not on file   Highest education level: Not on file  Occupational History   Not on file  Tobacco Use   Smoking status: Former    Types: Cigarettes    Quit date: 06/19/2007    Years since quitting: 15.7   Smokeless tobacco: Current   Tobacco comments:    Smoked for 20-30 years  Vaping Use   Vaping Use: Never used  Substance and Sexual Activity   Alcohol use: Yes    Comment: occ   Drug use: No   Sexual activity: Yes    Partners: Female  Other Topics Concern   Not on file  Social History Narrative   Not on file   Social  Determinants of Health   Financial Resource Strain: Not on file  Food Insecurity: Not on file  Transportation Needs: Not on file  Physical Activity: Not on file  Stress: Not on file  Social Connections: Not on file     Family History: The patient's family history includes Cancer in her brother and father. There is no history of CAD, Heart attack, or Congestive Heart Failure.  ROS:   Please see the history of present illness.     All other systems reviewed and are negative.  EKGs/Labs/Other Studies Reviewed:    The following studies were reviewed today: Cardiac Studies & Procedures   CARDIAC CATHETERIZATION  CARDIAC CATHETERIZATION 09/08/2017  Narrative  Ost LAD to [facility] LAD lesion is 20% stenosed.  Ost Cx to Prox Cx lesion is 10% stenosed with 10% stenosed side branch in Ost 1st Mrg.  There is severe left ventricular systolic dysfunction.  LV end diastolic pressure is normal.  The left ventricular ejection fraction is 25-35% by visual estimate.  1. Minor nonobstructive CAD 2. Severe LV dysfunction 3. Normal LVEDP  Plan: medical management.  Findings Coronary Findings Diagnostic  Dominance: Right  Left Anterior Descending Ost LAD to Dist LAD lesion is 20% stenosed. The lesion is moderately calcified.  Left Circumflex Ost Cx to Prox Cx lesion is 10% stenosed with 10% stenosed side branch in Ost 1st Mrg.  Intervention  No interventions have been documented.   STRESS TESTS  MYOCARDIAL PERFUSION IMAGING 08/25/2017  Narrative  The left ventricular ejection fraction is moderately decreased (30-44%).  Nuclear stress EF: 36%.  There was no ST segment deviation noted during stress.  Defect 1: There is a small defect of mild severity present in the apex location.  Defect 2: There is a medium defect of moderate severity present in the basal inferior and mid inferior location. There is ischemia in this distribution  This is a high risk study with reduced  ejection fraction and areas of infarct and ischemia identified.  Consider cardiac catheterization if stable from stroke perspective. Donato Schultz, MD   ECHOCARDIOGRAM  ECHOCARDIOGRAM COMPLETE 01/24/2020  Narrative ECHOCARDIOGRAM REPORT    Patient Name:   SUE-ANN BRADNEY Negron Date of Exam: 01/24/2020 Medical Rec #:  [id number]     Height:       67.0 in Accession #:    [id number]    Weight:       156.0 lb Date of Birth:  11/13/1950     BSA:          1.820 m Patient Age:    68 years      BP:           140/70 mmHg Patient Gender: F  HR:           56 bpm. Exam Location:  [address]  Procedure: 2D Echo, 3D Echo, Cardiac Doppler, Color Doppler and Strain Analysis  Indications:    I50.9 CHF  History:        Patient has prior history of Echocardiogram examinations, most recent 06/19/2017. NICM, Stroke; Risk Factors:Diabetes, Former Smoker and Hypertension.  Sonographer:    Chanetta Marshall BA, RDCS Referring Phys: [id number] Kanyia Heaslip C Jeron Grahn  IMPRESSIONS   1. Left ventricular ejection fraction, by estimation, is 40%. The left ventricle has mildly decreased function. The left ventricle demonstrates global hypokinesis. The left ventricular internal cavity size was severely dilated. Left ventricular diastolic parameters are consistent with Grade I diastolic dysfunction (impaired relaxation). Elevated left ventricular end-diastolic pressure. The average left ventricular global longitudinal strain is -15.7 %. 2. Right ventricular systolic function is normal. The right ventricular size is normal. 3. The mitral valve is normal in structure. Trivial mitral valve regurgitation. No evidence of mitral stenosis. 4. The aortic valve is tricuspid. Aortic valve regurgitation is not visualized. Mild to moderate aortic valve sclerosis/calcification is present, without any evidence of aortic stenosis. 5. The inferior vena cava is normal in size with greater than 50% respiratory variability, suggesting right  atrial pressure of 3 mmHg.  Comparison(s): Left ventricle: The cavity size was mildly dilated. There was mild concentric hypertrophy. Systolic function was moderately to severely reduced. The estimated ejection fraction was in the range of 30% to 35%. Diffuse hypokinesis.  FINDINGS Left Ventricle: Left ventricular ejection fraction, by estimation, is 40%. The left ventricle has mildly decreased function. The left ventricle demonstrates global hypokinesis. The average left ventricular global longitudinal strain is -15.7 %. The left ventricular internal cavity size was severely dilated. There is no left ventricular hypertrophy. Left ventricular diastolic parameters are consistent with Grade I diastolic dysfunction (impaired relaxation). Elevated left ventricular end-diastolic pressure.  Right Ventricle: The right ventricular size is normal. No increase in right ventricular wall thickness. Right ventricular systolic function is normal.  Left Atrium: Left atrial size was normal in size.  Right Atrium: Right atrial size was normal in size.  Pericardium: There is no evidence of pericardial effusion.  Mitral Valve: The mitral valve is normal in structure. Normal mobility of the mitral valve leaflets. Mild mitral annular calcification. Trivial mitral valve regurgitation. No evidence of mitral valve stenosis.  Tricuspid Valve: The tricuspid valve is normal in structure. Tricuspid valve regurgitation is not demonstrated. No evidence of tricuspid stenosis.  Aortic Valve: The aortic valve is tricuspid. Aortic valve regurgitation is not visualized. Mild to moderate aortic valve sclerosis/calcification is present, without any evidence of aortic stenosis.  Pulmonic Valve: The pulmonic valve was normal in structure. Pulmonic valve regurgitation is not visualized. No evidence of pulmonic stenosis.  Aorta: The aortic root is normal in size and structure.  Venous: The inferior vena cava is normal in size  with greater than 50% respiratory variability, suggesting right atrial pressure of 3 mmHg.  IAS/Shunts: The interatrial septum appears to be lipomatous. No atrial level shunt detected by color flow Doppler.   LEFT VENTRICLE PLAX 2D LVIDd:         6.10 cm  Diastology LVIDs:         4.70 cm  LV e' lateral:   4.05 cm/s LV PW:         1.00 cm  LV E/e' lateral: 13.9 LV IVS:        0.90 cm  LV e'  medial:    3.08 cm/s LVOT diam:     2.30 cm  LV E/e' medial:  18.2 LV SV:         64 LV SV Index:   35       2D Longitudinal Strain LVOT Area:     4.15 cm 2D Strain GLS (A2C):   -15.2 % 2D Strain GLS (A3C):   -16.3 % 2D Strain GLS (A4C):   -15.6 % 2D Strain GLS Avg:     -15.7 %  3D Volume EF: 3D EF:        52 % LV EDV:       227 ml LV ESV:       109 ml LV SV:        118 ml  RIGHT VENTRICLE RV Basal diam:  2.90 cm RV Mid diam:    1.80 cm RV S prime:     9.68 cm/s TAPSE (M-mode): 2.1 cm  LEFT ATRIUM             Index LA diam:        4.70 cm 2.58 cm/m LA Vol (A2C):   58.7 ml 32.26 ml/m LA Vol (A4C):   53.2 ml 29.24 ml/m LA Biplane Vol: 56.4 ml 31.00 ml/m AORTIC VALVE LVOT Vmax:   67.90 cm/s LVOT Vmean:  54.500 cm/s LVOT VTI:    0.153 m  AORTA Ao Root diam: 3.20 cm Ao Asc diam:  3.50 cm  MITRAL VALVE MV Area (PHT): 2.71 cm    SHUNTS MV Decel Time: 280 msec    Systemic VTI:  0.15 m MV E velocity: 56.20 cm/s  Systemic Diam: 2.30 cm MV A velocity: 96.10 cm/s MV E/A ratio:  0.58  Armanda Magic MD Electronically signed by Armanda Magic MD Signature Date/Time: 01/24/2020/2:04:39 PM    Final   TEE  ECHO TEE 06/21/2017  Narrative ** *[facility]* [address] [place], [place] [address] [phone]  ------------------------------------------------------------------- Transesophageal Echocardiography  Patient:    Adamae, Gruis MR #:       [id number] Study Date: 06/21/2017 Gender:     F Age:        50 Height: Weight: BSA: Pt.  Status: Room:  Simon Rhein ATTENDING  Edsel Petrin [id number] ADMITTING  Joen Laura P  cc:  ------------------------------------------------------------------- LV EF: 30% -   35%  ------------------------------------------------------------------- Study Conclusions  - Left ventricle: The cavity size was mildly dilated. There was mild concentric hypertrophy. Systolic function was moderately to severely reduced. The estimated ejection fraction was in the range of 30% to 35%. Diffuse hypokinesis. - Aortic valve: No evidence of vegetation. - Mitral valve: There was mild regurgitation. - Left atrium: No evidence of thrombus in the atrial cavity or appendage. No evidence of thrombus in the atrial cavity or appendage. No evidence of thrombus in the appendage. - Right atrium: The atrium was dilated. No evidence of thrombus in the atrial cavity or appendage. - Atrial septum: No defect or patent foramen ovale was identified. Echo contrast study showed no right-to-left atrial level shunt, following an increase in RA pressure induced by provocative maneuvers. - Pulmonic valve: No evidence of vegetation.  Impressions:  - No cardiac source of emboli was indentified.  ------------------------------------------------------------------- Study data:   Procedure:  Initial setup. The patient was brought to the laboratory. Surface ECG leads were monitored. Sedation. Conscious sedation was administered. Transesophageal echocardiography. Topical anesthesia was obtained using viscous lidocaine.  A transesophageal probe was inserted by the attending cardiologist. Image quality was adequate.  Study completion:  The patient tolerated the procedure well. There were no complications. Diagnostic transesophageal echocardiography.  2D and color Doppler.  Birthdate:  Patient birthdate: 1950/11/11.  Age:  Patient is 72 yr old.  Sex:  Gender: female.  Study  date:  Study date: 06/21/2017. Study time: 11:16 AM.  -------------------------------------------------------------------  ------------------------------------------------------------------- Left ventricle:  The cavity size was mildly dilated. There was mild concentric hypertrophy. Systolic function was moderately to severely reduced. The estimated ejection fraction was in the range of 30% to 35%. Diffuse hypokinesis.  ------------------------------------------------------------------- Aortic valve:   Structurally normal valve. Trileaflet; normal thickness leaflets. Cusp separation was normal.  No evidence of vegetation.  Doppler:  There was no regurgitation.  ------------------------------------------------------------------- Aorta:  There was no atheroma. There was no evidence for dissection. Aortic root: The aortic root was not dilated. Ascending aorta: The ascending aorta was normal in size. Aortic arch: The aortic arch was normal in size. Descending aorta: The descending aorta was normal in size.  ------------------------------------------------------------------- Mitral valve:   Structurally normal valve.   Leaflet separation was normal.  Doppler:  There was mild regurgitation.  ------------------------------------------------------------------- Left atrium:  The atrium was normal in size.  No evidence of thrombus in the atrial cavity or appendage.  No evidence of thrombus in the atrial cavity or appendage.  No evidence of thrombus in the appendage. The appendage was morphologically a left appendage, multilobulated, and of normal size. Emptying velocity was normal.  ------------------------------------------------------------------- Atrial septum:  No defect or patent foramen ovale was identified. Echo contrast study showed no right-to-left atrial level shunt, following an increase in RA pressure induced by  provocative maneuvers.  ------------------------------------------------------------------- Right ventricle:  The cavity size was normal. Wall thickness was normal. Systolic function was normal.  ------------------------------------------------------------------- Pulmonic valve:    Structurally normal valve.   Cusp separation was normal.  No evidence of vegetation.  ------------------------------------------------------------------- Tricuspid valve:   Structurally normal valve.   Leaflet separation was normal.  Doppler:  There was mild regurgitation.  ------------------------------------------------------------------- Pulmonary artery:   The main pulmonary artery was normal-sized.  ------------------------------------------------------------------- Right atrium:  The atrium was dilated.  No evidence of thrombus in the atrial cavity or appendage. The appendage was morphologically a right appendage.  ------------------------------------------------------------------- Pericardium:  There was no pericardial effusion.  ------------------------------------------------------------------- Prepared and Electronically Authenticated by  [name], M.D. 2018-08-22T12:17:34     CARDIAC MRI  MR CARDIAC MORPHOLOGY W WO CONTRAST 10/06/2022  Narrative EXAM: CARDIAC MRI  TECHNIQUE: The patient was scanned on a 1.5 Tesla Siemens magnet. A dedicated cardiac coil was used. Functional imaging was done using Fiesta sequences. 2,3, and 4 chamber views were done to assess for RWMA's. Modified Simpson's rule using a short axis stack was used to calculate an ejection fraction on a dedicated work [employer]. The patient received 10 cc of Gadavist. After 10 minutes inversion recovery sequences were used to assess for infiltration and scar tissue.  CONTRAST:  Gadavist  FINDINGS: Mild LAE. Normal RA. Lipomatous hypertrophy atrial septum no PFO. No pericardial effusion.  Normal ascending thoracic aorta 3.3 cm Normal MV, AV, TV and PV Normal RVOT. Mild LVE. Mild LVH septal thickness 12 mm Global hypokinesis Quantitative LVEF 36% (EDV 138 ESV 49 SV 49 cc) Flow analysis through aortic valve shows decreased cardiac output 2.3 L/min normal RV size and function RVEF 58% (EDV 61 cc ESV 26 cc SV 35 cc) Delayed gadolinium images  with no uptake no evidence of infiltration, scar or infarct  Parametric Measures  T1 normal 1014 msec  ECV normal 24%  T2 normal 41.6 msec  IMPRESSION: 1.  Mild LVE and LVH with global hypokinesis LVEF 36%  2. Delayed gadolinium images with no uptake No evidence of infiltration, scar, infarct  3.  Normal parametric measures see above  4.  Normal RVEF 58%  5.  Mild LAE  6.  Lipomatous hypertrophy of atrial septum  Charlton Haws   Electronically Signed By: Charlton Haws M.D. On: 10/06/2022 16:33          EKG: no new  Recent Labs: 07/18/2022: Hemoglobin 13.4; Magnesium 2.2; Platelets 274; TSH 1.280 11/07/2022: ALT 9 11/17/2022: BUN 10; Creatinine, Ser 0.89; Potassium 3.8; Sodium 144  Recent Lipid Panel    Component Value Date/Time   CHOL 152 10/07/2019 1358   TRIG 97 10/07/2019 1358   HDL 51 10/07/2019 1358   CHOLHDL 3.0 10/07/2019 1358   CHOLHDL 3.8 06/19/2017 0310   VLDL 16 06/19/2017 0310   LDLCALC 83 10/07/2019 1358     Risk Assessment/Calculations:              Physical Exam:    VS:  BP (!) 178/78   Pulse 63   Ht 5\' 7"  (1.702 m)   Wt 153 lb (69.4 kg)   SpO2 99%   BMI 23.96 kg/m     Wt Readings from Last 3 Encounters:  03/15/23 153 lb (69.4 kg)  11/07/22 171 lb 9.6 oz (77.8 kg)  09/01/22 156 lb (70.8 kg)     GEN:  Well nourished, well developed in no acute distress HEENT: Normal NECK: No JVD; No carotid bruits LYMPHATICS: No lymphadenopathy CARDIAC: RRR, no murmurs, rubs, gallops RESPIRATORY:  Clear to auscultation without rales, wheezing or rhonchi  ABDOMEN: Soft, non-tender,  non-distended MUSCULOSKELETAL:  No edema; No deformity  SKIN: Warm and dry NEUROLOGIC:  Alert and oriented x 3 PSYCHIATRIC:  Normal affect   ASSESSMENT:    1. Mild CAD   2. Chronic HFrEF (heart failure with reduced ejection fraction) (HCC)   3. Mild renal insufficiency   4. Hyperlipidemia, unspecified hyperlipidemia type   5. Coronary artery disease involving native coronary artery of native heart without angina pectoris    PLAN:    In order of problems listed above:  Chronic systolic heart failure secondary to nonischemic cardiomyopathy - EF remains in the 30s.  Cardiac MRI as above.  She is not on Entresto secondary to social determinants of health, unable to afford.  Continuing with carvedilol furosemide lisinopril.  Have held off previously from spironolactone secondary to potassium values.  SGLT2 also held off for financial reasons as well. -Blood pressure is elevated today, at home sometimes in the 140 range.  Has been in the 130s previously.  Continue to monitor.  Diet, exercise, low-sodium.  Energy levels have been low encouraged daily walking.  Status post cryptogenic stroke - On Plavix.  Stable.  Managed by neurology.  Chronic kidney disease stage 2 - Sees nephrology now.  + Mild proteinuria.  On lisinopril.  Mild coronary artery disease with aortic atherosclerosis - No anginal symptoms asymptomatic.  Not contributing to her reduction in ejection fraction.  Hyperlipidemia - On Crestor 40 mg.  High intensity.  Prior LDL 83.  Continue with diet and exercise as well.          Medication Adjustments/Labs and Tests Ordered: Current medicines are reviewed at length with the patient today.  Concerns regarding medicines are outlined above.  No orders of the defined types were placed in this encounter.  No orders of the defined types were placed in this encounter.   Patient Instructions  Medication Instructions:  The current medical regimen is effective;  continue  present plan and medications.  *If you need a refill on your cardiac medications before your next appointment, please call your pharmacy*  Follow-Up: At [facility], you and your health needs are our priority.  As part of our continuing mission to provide you with exceptional heart care, we have created designated Provider Care Teams.  These Care Teams include your primary Cardiologist (physician) and Advanced Practice Providers (APPs -  Physician Assistants and Nurse Practitioners) who all work together to provide you with the care you need, when you need it.  We recommend signing up for the patient portal called "MyChart".  Sign up information is provided on this After Visit Summary.  MyChart is used to connect with patients for Virtual Visits (Telemedicine).  Patients are able to view lab/test results, encounter notes, upcoming appointments, etc.  Non-urgent messages can be sent to your provider as well.   To learn more about what you can do with MyChart, go to [email or web address].    Your next appointment:   6 month(s)  Provider:   Jari Favre, PA-C, Jacolyn Reedy, PA-C, Eligha Bridegroom, NP, or Tereso Newcomer, PA-C           Signed, Donato Schultz, MD  03/15/2023 10:01 AM    [facility]

## 2023-04-15 ENCOUNTER — Other Ambulatory Visit: Payer: Self-pay | Admitting: Cardiology

## 2023-10-02 ENCOUNTER — Ambulatory Visit: Payer: Medicare Other | Admitting: Physician Assistant

## 2023-11-05 NOTE — Progress Notes (Deleted)
  Cardiology Office Note:  .   Date:  11/05/2023  ID:  Elizabeth Bowman, DOB 04/01/1951, MRN [id number] PCP: Knox Toribio KATHEE., MD  [facility] Providers Cardiologist:  Oneil Parchment, MD {  History of Present Illness: .   Elizabeth Bowman is a 73 y.o. 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  Back in September 2023 she was having increased fatigue for several weeks.  Cardiac MRI was ordered to exclude infiltrative cardiomyopathy.  Blood work showed mild hypercalcemia with high normal potassium.  EF was 36, no infiltrative disease.  She was last seen in May 2024 by Dr. Parchment at that time she was having some fatigue.  Likes to rest quite often during the day.  Activity was encouraged.  Luckily, no chest pain, syncope, or bleeding.  Today, she***  ROS: ***  Studies Reviewed: .        *** Risk Assessment/Calculations:   {Does this patient have ATRIAL FIBRILLATION?:[id number]} No BP recorded.  {Refresh Note OR Click here to enter BP  :1}***       Physical Exam:   VS:  There were no vitals taken for this visit.   Wt Readings from Last 3 Encounters:  03/15/23 153 lb (69.4 kg)  11/07/22 171 lb 9.6 oz (77.8 kg)  09/01/22 156 lb (70.8 kg)    GEN: Well nourished, well developed in no acute distress NECK: No JVD; No carotid bruits CARDIAC: ***RRR, no murmurs, rubs, gallops RESPIRATORY:  Clear to auscultation without rales, wheezing or rhonchi  ABDOMEN: Soft, non-tender, non-distended EXTREMITIES:  No edema; No deformity   ASSESSMENT AND PLAN: .   Mild CAD Chronic HFrEF Mild renal  insufficiency Hyperlipidemia     {Are you ordering a CV Procedure (e.g. stress test, cath, DCCV, TEE, etc)?   Press F2        :[id number]}  Dispo: ***  Signed, Orren LOISE Fabry, PA-C

## 2023-11-06 ENCOUNTER — Ambulatory Visit: Payer: Medicare Other | Attending: Physician Assistant | Admitting: Physician Assistant

## 2023-11-06 DIAGNOSIS — I6523 Occlusion and stenosis of bilateral carotid arteries: Secondary | ICD-10-CM

## 2023-11-06 DIAGNOSIS — N289 Disorder of kidney and ureter, unspecified: Secondary | ICD-10-CM

## 2023-11-06 DIAGNOSIS — I5022 Chronic systolic (congestive) heart failure: Secondary | ICD-10-CM

## 2023-11-06 DIAGNOSIS — Z79899 Other long term (current) drug therapy: Secondary | ICD-10-CM

## 2023-11-06 DIAGNOSIS — E785 Hyperlipidemia, unspecified: Secondary | ICD-10-CM

## 2023-11-06 DIAGNOSIS — I700 Atherosclerosis of aorta: Secondary | ICD-10-CM

## 2023-11-06 DIAGNOSIS — I2510 Atherosclerotic heart disease of native coronary artery without angina pectoris: Secondary | ICD-10-CM

## 2024-01-29 ENCOUNTER — Other Ambulatory Visit: Payer: Self-pay | Admitting: Cardiology

## 2024-05-02 ENCOUNTER — Other Ambulatory Visit: Payer: Self-pay | Admitting: Cardiology

## 2024-06-04 ENCOUNTER — Telehealth: Payer: Self-pay | Admitting: Cardiology

## 2024-06-04 MED ORDER — LISINOPRIL 20 MG PO TABS
20.0000 mg | ORAL_TABLET | Freq: Every day | ORAL | 0 refills | Status: DC
Start: 2024-06-04 — End: 2024-09-17

## 2024-06-04 NOTE — Telephone Encounter (Signed)
*  STAT* If patient is at the pharmacy, call can be transferred to refill team.   1. Which medications need to be refilled? (please list name of each medication and dose if known)   lisinopril  (ZESTRIL ) 20 MG tablet   2. Would you like to learn more about the convenience, safety, & potential cost savings by using the [facility]?   3. Are you open to using the [facility] (Type [facility]. ).  4. Which pharmacy/location (including street and city if local pharmacy) is medication to be sent to?  [id number] - [place], [place] - [address]   5. Do they need a 30 day or 90 day supply?   Patient stated she only has 3 days left of this medication.  Patient has appointment scheduled on 9/23 with PA M. Lenze.

## 2024-07-16 NOTE — Progress Notes (Signed)
 Cardiology Office Note:  .   Date:  07/23/2024  ID:  Elizabeth Bowman, DOB [birth year], MRN [id number] PCP: Abran Jon CROME, MD  [facility] Providers Cardiologist:  Oneil Parchment, MD    History of Present Illness: .   Elizabeth Bowman is a 73 y.o. 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   Back last year in September 2023 she was having increasing fatigue for several weeks.  Cardiac MRI was ordered to exclude infiltrative cardiomyopathy.  Blood work showed mild hypercalcemia high normal potassium.  EF was 36% no infiltrative disease.  Patient here for routine f/u. Labs reviewed in care everywhere. Denies chest pain, dyspnea, palpitations, dizziness, edema. She tires easily. She doesn't get any exercise.when she has a busy day she says the next day she has to rest all day.     ROS:    Studies Reviewed: SABRA    EKG Interpretation Date/Time:  Tuesday July 23 2024 11:30:01 EDT Ventricular Rate:  65 PR Interval:  160 QRS Duration:  96 QT Interval:  392 QTC Calculation: 407 R Axis:   36  Text Interpretation: Normal sinus rhythm Possible Left atrial enlargement Left ventricular hypertrophy ( R in aVL , Sokolow-Lyon , Romhilt-Estes ) Nonspecific ST and T wave abnormality When compared with ECG of 19-Oct-2021 11:52, PREVIOUS ECG IS PRESENT Confirmed by Parthenia Klinefelter [phone]) on 07/23/2024 11:33:46 AM    Prior CV Studies:   Cardiac MRA 09/2022 FINDINGS: Mild LAE. Normal RA. Lipomatous hypertrophy atrial septum no PFO. No pericardial effusion. Normal ascending thoracic aorta 3.3 cm Normal MV, AV, TV and PV Normal RVOT. Mild LVE. Mild  LVH septal thickness 12 mm Global hypokinesis Quantitative LVEF 36% (EDV 138 ESV 49 SV 49 cc) Flow analysis through aortic valve shows decreased cardiac output 2.3 L/min normal RV size and function RVEF 58% (EDV 61 cc ESV 26 cc SV 35 cc) Delayed gadolinium images with no uptake no evidence of infiltration, scar or infarct   Parametric Measures   T1 normal 1014 msec   ECV normal 24%   T2 normal 41.6 msec   IMPRESSION: 1.  Mild LVE and LVH with global hypokinesis LVEF 36%   2. Delayed gadolinium images with no uptake No evidence of infiltration, scar, infarct   3.  Normal parametric measures see above   4.  Normal RVEF 58%   5.  Mild LAE   6.  Lipomatous hypertrophy of atrial septum   7.  Decreased cardiac output 2.3 L/min   Maude Emmer     Electronically Signed   By: Maude Emmer M.D.   On: 10/06/2022 16:35  Risk Assessment/Calculations:             Physical Exam:   VS:  BP 138/70 (BP Location: Right Arm, Patient Position: Sitting, Cuff Size: Normal)   Pulse 65   Ht 5' 7 (1.702 m)   Wt 154 lb (69.9 kg)   BMI 24.12 kg/m    Orhtostatics: No data found. Wt Readings from Last 3 Encounters:  07/23/24 154 lb (69.9 kg)  03/15/23 153 lb (69.4 kg)  11/07/22 171 lb 9.6 oz (77.8 kg)    GEN: Well nourished, well developed in no acute distress  NECK: No JVD; No carotid bruits CARDIAC:  RRR, no murmurs, rubs, gallops RESPIRATORY:  Clear to auscultation without rales, wheezing or rhonchi  ABDOMEN: Soft, non-tender, non-distended EXTREMITIES:  No edema; No deformity   ASSESSMENT AND PLAN: .   Chronic systolic heart failure secondary to nonischemic cardiomyopathy - EF remains in the 30s.  Cardiac MRI as above.  She is not on Entresto  secondary to social determinants of health, unable to afford.  Continuing with carvedilol , lisinopril .  Have held off previously from spironolactone secondary to potassium values.  SGLT2 also held off for financial reasons as well. Has  been off lasix  for several years now. Euvolemic - Diet, exercise, low-sodium.  Energy levels have been low encouraged daily walking. -repeat echo to f/u in EF   Status post cryptogenic stroke - On Plavix .  Stable.  Managed by neurology.   Chronic kidney disease stage 2 - Sees nephrology now.  + Mild proteinuria.  On lisinopril .   Mild coronary artery disease with aortic atherosclerosis - No anginal symptoms asymptomatic.  Not contributing to her reduction in ejection fraction.   Hyperlipidemia - On Crestor 40 mg. LDL 90 care everywhere 10/2023.  Continue with diet and exercise as well.          Dispo: f/u in 1 yr.   Signed, Olivia Pavy, PA-C

## 2024-07-23 ENCOUNTER — Ambulatory Visit: Attending: Physician Assistant | Admitting: Physician Assistant

## 2024-07-23 ENCOUNTER — Encounter: Payer: Self-pay | Admitting: Physician Assistant

## 2024-07-23 VITALS — BP 138/70 | HR 65 | Ht 67.0 in | Wt 154.0 lb

## 2024-07-23 DIAGNOSIS — I2510 Atherosclerotic heart disease of native coronary artery without angina pectoris: Secondary | ICD-10-CM

## 2024-07-23 DIAGNOSIS — I5022 Chronic systolic (congestive) heart failure: Secondary | ICD-10-CM

## 2024-07-23 DIAGNOSIS — E785 Hyperlipidemia, unspecified: Secondary | ICD-10-CM

## 2024-07-23 DIAGNOSIS — Z8673 Personal history of transient ischemic attack (TIA), and cerebral infarction without residual deficits: Secondary | ICD-10-CM

## 2024-07-23 DIAGNOSIS — N289 Disorder of kidney and ureter, unspecified: Secondary | ICD-10-CM

## 2024-07-23 DIAGNOSIS — Z79899 Other long term (current) drug therapy: Secondary | ICD-10-CM | POA: Diagnosis not present

## 2024-07-23 NOTE — Patient Instructions (Addendum)
 Medication Instructions:  NO CHANGES  Lab Work: NONE TO BE DONE TODAY.  Testing/Procedures: Your physician has requested that you have an echocardiogram. Echocardiography is a painless test that uses sound waves to create images of your heart. It provides your doctor with information about the size and shape of your heart and how well your heart's chambers and valves are working. This procedure takes approximately one hour. There are no restrictions for this procedure. Please do NOT wear cologne, perfume, aftershave, or lotions (deodorant is allowed). Please arrive 15 minutes prior to your appointment time.  Please note: We ask at that you not bring children with you during ultrasound (echo/ vascular) testing. Due to room size and safety concerns, children are not allowed in the ultrasound rooms during exams. Our front office staff cannot provide observation of children in our lobby area while testing is being conducted. An adult accompanying a patient to their appointment will only be allowed in the ultrasound room at the discretion of the ultrasound technician under special circumstances. We apologize for any inconvenience.   Follow-Up: At [facility], you and your health needs are our priority.  As part of our continuing mission to provide you with exceptional heart care, our providers are all part of one team.  This team includes your primary Cardiologist (physician) and Advanced Practice Providers or APPs (Physician Assistants and Nurse Practitioners) who all work together to provide you with the care you need, when you need it.  Your next appointment:    1 YEAR  Provider:   ONEIL PARCHMENT, MD    Other Instructions: PLEASE BE SURE TO EXERCISE FOR AT LEAST 30 MINUTES EACH DAY.

## 2024-09-04 ENCOUNTER — Ambulatory Visit (HOSPITAL_COMMUNITY)
Admission: RE | Admit: 2024-09-04 | Discharge: 2024-09-04 | Disposition: A | Discharge status: Home / Self Care | Source: Ambulatory Visit | Attending: Cardiology | Admitting: Cardiology

## 2024-09-04 DIAGNOSIS — Z79899 Other long term (current) drug therapy: Secondary | ICD-10-CM | POA: Diagnosis not present

## 2024-09-04 DIAGNOSIS — I2510 Atherosclerotic heart disease of native coronary artery without angina pectoris: Secondary | ICD-10-CM

## 2024-09-04 DIAGNOSIS — Z8673 Personal history of transient ischemic attack (TIA), and cerebral infarction without residual deficits: Secondary | ICD-10-CM | POA: Diagnosis not present

## 2024-09-04 DIAGNOSIS — I5022 Chronic systolic (congestive) heart failure: Secondary | ICD-10-CM | POA: Diagnosis present

## 2024-09-04 DIAGNOSIS — E785 Hyperlipidemia, unspecified: Secondary | ICD-10-CM

## 2024-09-04 DIAGNOSIS — N289 Disorder of kidney and ureter, unspecified: Secondary | ICD-10-CM | POA: Diagnosis present

## 2024-09-04 LAB — ECHOCARDIOGRAM COMPLETE
Area-P 1/2: 2.24 cm2
S' Lateral: 4.48 cm

## 2024-09-05 ENCOUNTER — Ambulatory Visit: Payer: Self-pay | Admitting: Physician Assistant

## 2024-09-13 ENCOUNTER — Other Ambulatory Visit: Payer: Self-pay | Admitting: Cardiology
